# Patient Record
Sex: Female | Born: 1975 | Race: White | Hispanic: No | Marital: Married | Smoking: Never smoker
Health system: Southern US, Community
[De-identification: ages and names within clinical notes are randomized; demographics above are authoritative.]

## PROBLEM LIST (undated history)

## (undated) DIAGNOSIS — Q8740 Marfan's syndrome, unspecified: Secondary | ICD-10-CM

## (undated) HISTORY — PX: HERNIA REPAIR: SHX51

---

## 2010-09-18 ENCOUNTER — Ambulatory Visit (INDEPENDENT_AMBULATORY_CARE_PROVIDER_SITE_OTHER): Payer: 59

## 2010-09-18 ENCOUNTER — Inpatient Hospital Stay (INDEPENDENT_AMBULATORY_CARE_PROVIDER_SITE_OTHER)
Admission: RE | Admit: 2010-09-18 | Discharge: 2010-09-18 | Disposition: A | Discharge status: Home / Self Care | Payer: 59 | Source: Ambulatory Visit | Attending: Emergency Medicine | Admitting: Emergency Medicine

## 2010-09-18 DIAGNOSIS — R109 Unspecified abdominal pain: Secondary | ICD-10-CM

## 2010-09-18 LAB — POCT URINALYSIS DIPSTICK
Nitrite: NEGATIVE
Protein, ur: NEGATIVE mg/dL
Urobilinogen, UA: 0.2 mg/dL (ref 0.0–1.0)

## 2010-09-18 LAB — POCT PREGNANCY, URINE: Preg Test, Ur: NEGATIVE

## 2010-09-19 LAB — URINE CULTURE: Colony Count: NO GROWTH

## 2011-02-23 ENCOUNTER — Emergency Department (HOSPITAL_COMMUNITY): Payer: 59

## 2011-02-23 ENCOUNTER — Emergency Department (HOSPITAL_COMMUNITY)
Admission: EM | Admit: 2011-02-23 | Discharge: 2011-02-23 | Disposition: A | Discharge status: Home / Self Care | Payer: 59 | Attending: Emergency Medicine | Admitting: Emergency Medicine

## 2011-02-23 DIAGNOSIS — N83209 Unspecified ovarian cyst, unspecified side: Secondary | ICD-10-CM | POA: Insufficient documentation

## 2011-02-23 DIAGNOSIS — R011 Cardiac murmur, unspecified: Secondary | ICD-10-CM | POA: Insufficient documentation

## 2011-02-23 DIAGNOSIS — I059 Rheumatic mitral valve disease, unspecified: Secondary | ICD-10-CM | POA: Insufficient documentation

## 2011-02-23 DIAGNOSIS — R1032 Left lower quadrant pain: Secondary | ICD-10-CM | POA: Insufficient documentation

## 2011-02-23 DIAGNOSIS — R109 Unspecified abdominal pain: Secondary | ICD-10-CM | POA: Insufficient documentation

## 2011-02-23 LAB — URINALYSIS, ROUTINE W REFLEX MICROSCOPIC
Ketones, ur: NEGATIVE mg/dL
Leukocytes, UA: NEGATIVE
Nitrite: NEGATIVE
pH: 5.5 (ref 5.0–8.0)

## 2011-02-23 LAB — POCT PREGNANCY, URINE: Preg Test, Ur: NEGATIVE

## 2011-02-23 LAB — WET PREP, GENITAL: Clue Cells Wet Prep HPF POC: NONE SEEN

## 2015-12-18 DIAGNOSIS — Q8740 Marfan's syndrome, unspecified: Secondary | ICD-10-CM | POA: Diagnosis not present

## 2015-12-18 DIAGNOSIS — I359 Nonrheumatic aortic valve disorder, unspecified: Secondary | ICD-10-CM | POA: Diagnosis not present

## 2015-12-18 DIAGNOSIS — R002 Palpitations: Secondary | ICD-10-CM | POA: Diagnosis not present

## 2016-02-24 DIAGNOSIS — Q8740 Marfan's syndrome, unspecified: Secondary | ICD-10-CM | POA: Diagnosis not present

## 2016-06-30 DIAGNOSIS — H5203 Hypermetropia, bilateral: Secondary | ICD-10-CM | POA: Diagnosis not present

## 2016-06-30 DIAGNOSIS — H52223 Regular astigmatism, bilateral: Secondary | ICD-10-CM | POA: Diagnosis not present

## 2016-12-01 DIAGNOSIS — Q8740 Marfan's syndrome, unspecified: Secondary | ICD-10-CM | POA: Diagnosis not present

## 2016-12-01 DIAGNOSIS — I77810 Thoracic aortic ectasia: Secondary | ICD-10-CM | POA: Diagnosis not present

## 2016-12-01 DIAGNOSIS — I341 Nonrheumatic mitral (valve) prolapse: Secondary | ICD-10-CM | POA: Diagnosis not present

## 2016-12-02 DIAGNOSIS — I77810 Thoracic aortic ectasia: Secondary | ICD-10-CM | POA: Diagnosis not present

## 2016-12-02 DIAGNOSIS — Z8249 Family history of ischemic heart disease and other diseases of the circulatory system: Secondary | ICD-10-CM | POA: Diagnosis not present

## 2016-12-02 DIAGNOSIS — I341 Nonrheumatic mitral (valve) prolapse: Secondary | ICD-10-CM | POA: Diagnosis not present

## 2016-12-02 DIAGNOSIS — Q8740 Marfan's syndrome, unspecified: Secondary | ICD-10-CM | POA: Diagnosis not present

## 2016-12-02 DIAGNOSIS — R001 Bradycardia, unspecified: Secondary | ICD-10-CM | POA: Diagnosis not present

## 2016-12-09 DIAGNOSIS — R001 Bradycardia, unspecified: Secondary | ICD-10-CM | POA: Diagnosis not present

## 2016-12-15 DIAGNOSIS — R001 Bradycardia, unspecified: Secondary | ICD-10-CM | POA: Diagnosis not present

## 2016-12-15 DIAGNOSIS — I77810 Thoracic aortic ectasia: Secondary | ICD-10-CM | POA: Diagnosis not present

## 2016-12-15 DIAGNOSIS — I341 Nonrheumatic mitral (valve) prolapse: Secondary | ICD-10-CM | POA: Diagnosis not present

## 2016-12-15 DIAGNOSIS — Q8740 Marfan's syndrome, unspecified: Secondary | ICD-10-CM | POA: Diagnosis not present

## 2016-12-21 DIAGNOSIS — I340 Nonrheumatic mitral (valve) insufficiency: Secondary | ICD-10-CM | POA: Diagnosis not present

## 2016-12-21 DIAGNOSIS — Q8740 Marfan's syndrome, unspecified: Secondary | ICD-10-CM | POA: Diagnosis not present

## 2017-01-01 DIAGNOSIS — I77810 Thoracic aortic ectasia: Secondary | ICD-10-CM | POA: Diagnosis not present

## 2017-01-01 DIAGNOSIS — Q8740 Marfan's syndrome, unspecified: Secondary | ICD-10-CM | POA: Diagnosis not present

## 2017-01-01 DIAGNOSIS — I341 Nonrheumatic mitral (valve) prolapse: Secondary | ICD-10-CM | POA: Diagnosis not present

## 2017-01-20 DIAGNOSIS — Z7183 Encounter for nonprocreative genetic counseling: Secondary | ICD-10-CM | POA: Diagnosis not present

## 2017-02-05 DIAGNOSIS — Z0000 Encounter for general adult medical examination without abnormal findings: Secondary | ICD-10-CM | POA: Diagnosis not present

## 2017-02-09 DIAGNOSIS — Z0000 Encounter for general adult medical examination without abnormal findings: Secondary | ICD-10-CM | POA: Diagnosis not present

## 2017-02-09 DIAGNOSIS — Z23 Encounter for immunization: Secondary | ICD-10-CM | POA: Diagnosis not present

## 2017-02-09 DIAGNOSIS — I341 Nonrheumatic mitral (valve) prolapse: Secondary | ICD-10-CM | POA: Diagnosis not present

## 2017-02-09 DIAGNOSIS — I77810 Thoracic aortic ectasia: Secondary | ICD-10-CM | POA: Diagnosis not present

## 2017-02-09 DIAGNOSIS — Q8740 Marfan's syndrome, unspecified: Secondary | ICD-10-CM | POA: Diagnosis not present

## 2017-02-16 MED FILL — LOSARTAN POTASSIUM 50 MG TA: 50 | 30 days supply | Qty: 60 | Fill #0

## 2017-02-16 MED FILL — ATENOLOL 25 MG TABLET: 25 | 90 days supply | Qty: 45 | Fill #0

## 2017-03-14 DIAGNOSIS — L0291 Cutaneous abscess, unspecified: Secondary | ICD-10-CM | POA: Diagnosis not present

## 2017-03-31 MED FILL — LOSARTAN POTASSIUM 50 MG TA: 50 | 30 days supply | Qty: 60 | Fill #1

## 2017-04-06 MED FILL — ATENOLOL 25 MG TABLET: 25 | 30 days supply | Qty: 30 | Fill #0

## 2017-05-06 MED FILL — LOSARTAN POTASSIUM 50 MG TA: 50 | 30 days supply | Qty: 60 | Fill #2

## 2017-05-06 MED FILL — ATENOLOL 25 MG TABLET: 25 | 30 days supply | Qty: 30 | Fill #1

## 2017-05-19 DIAGNOSIS — Q8740 Marfan's syndrome, unspecified: Secondary | ICD-10-CM | POA: Diagnosis not present

## 2017-05-19 DIAGNOSIS — I77810 Thoracic aortic ectasia: Secondary | ICD-10-CM | POA: Diagnosis not present

## 2017-05-19 DIAGNOSIS — I341 Nonrheumatic mitral (valve) prolapse: Secondary | ICD-10-CM | POA: Diagnosis not present

## 2017-05-19 DIAGNOSIS — I517 Cardiomegaly: Secondary | ICD-10-CM | POA: Diagnosis not present

## 2017-06-07 MED FILL — ATENOLOL 25 MG TABLET: 25 | 30 days supply | Qty: 30 | Fill #2

## 2017-06-07 MED FILL — LOSARTAN POTASSIUM 50 MG TA: 50 | 30 days supply | Qty: 60 | Fill #3

## 2017-07-07 MED FILL — ATENOLOL 25 MG TABLET: 25 | 30 days supply | Qty: 30 | Fill #3

## 2017-07-07 MED FILL — LOSARTAN POTASSIUM 50 MG TA: 50 | 30 days supply | Qty: 60 | Fill #4

## 2017-08-06 MED FILL — ATENOLOL 25 MG TABLET: 25 | 30 days supply | Qty: 30 | Fill #4

## 2017-08-06 MED FILL — LOSARTAN POTASSIUM 50 MG TA: 50 | 30 days supply | Qty: 60 | Fill #5

## 2017-09-07 MED FILL — LOSARTAN POTASSIUM 50 MG TA: 50 | 30 days supply | Qty: 60 | Fill #6

## 2017-09-07 MED FILL — ATENOLOL 25 MG TABLET: 25 | 30 days supply | Qty: 30 | Fill #5

## 2017-10-07 MED FILL — LOSARTAN POTASSIUM 50 MG TA: 50 | 30 days supply | Qty: 60 | Fill #7

## 2017-10-07 MED FILL — ATENOLOL 25 MG TABLET: 25 | 30 days supply | Qty: 30 | Fill #6

## 2017-11-04 MED FILL — ATENOLOL 25 MG TABLET: 25 | 30 days supply | Qty: 30 | Fill #7

## 2017-11-08 MED FILL — LOSARTAN POTASSIUM 50 MG TA: 50 | 30 days supply | Qty: 60 | Fill #8

## 2017-12-07 MED FILL — LOSARTAN POTASSIUM 50 MG TA: 50 | 30 days supply | Qty: 60 | Fill #0

## 2017-12-07 MED FILL — ATENOLOL 25 MG TABLET: 25 | 30 days supply | Qty: 30 | Fill #8

## 2018-01-06 MED FILL — ATENOLOL 25 MG TABLET: 25 | 30 days supply | Qty: 30 | Fill #9

## 2018-01-06 MED FILL — LOSARTAN POTASSIUM 50 MG TA: 50 | 30 days supply | Qty: 60 | Fill #1

## 2018-01-14 DIAGNOSIS — Z1231 Encounter for screening mammogram for malignant neoplasm of breast: Secondary | ICD-10-CM | POA: Diagnosis not present

## 2018-02-07 MED FILL — LOSARTAN POTASSIUM 50 MG TA: 50 | 30 days supply | Qty: 60 | Fill #2

## 2018-02-07 MED FILL — ATENOLOL 25 MG TABLET: 25 | 30 days supply | Qty: 30 | Fill #10

## 2018-02-25 DIAGNOSIS — H52223 Regular astigmatism, bilateral: Secondary | ICD-10-CM | POA: Diagnosis not present

## 2018-02-25 DIAGNOSIS — H5203 Hypermetropia, bilateral: Secondary | ICD-10-CM | POA: Diagnosis not present

## 2018-03-09 MED FILL — ATENOLOL 25 MG TABLET: 25 | 30 days supply | Qty: 30 | Fill #11

## 2018-03-09 MED FILL — LOSARTAN POTASSIUM 50 MG TA: 50 | 30 days supply | Qty: 60 | Fill #3

## 2018-04-07 MED FILL — LOSARTAN POTASSIUM 50 MG TA: 50 | 30 days supply | Qty: 60 | Fill #4

## 2018-04-07 MED FILL — ATENOLOL 25 MG TABLET: 25 | 90 days supply | Qty: 90 | Fill #0

## 2018-04-20 DIAGNOSIS — R001 Bradycardia, unspecified: Secondary | ICD-10-CM | POA: Diagnosis not present

## 2018-04-20 DIAGNOSIS — I341 Nonrheumatic mitral (valve) prolapse: Secondary | ICD-10-CM | POA: Diagnosis not present

## 2018-04-20 DIAGNOSIS — Q8740 Marfan's syndrome, unspecified: Secondary | ICD-10-CM | POA: Diagnosis not present

## 2018-04-20 DIAGNOSIS — I77810 Thoracic aortic ectasia: Secondary | ICD-10-CM | POA: Diagnosis not present

## 2018-04-20 DIAGNOSIS — Z8249 Family history of ischemic heart disease and other diseases of the circulatory system: Secondary | ICD-10-CM | POA: Diagnosis not present

## 2018-04-26 DIAGNOSIS — R001 Bradycardia, unspecified: Secondary | ICD-10-CM | POA: Diagnosis not present

## 2018-05-11 MED FILL — LOSARTAN POTASSIUM 50 MG TA: 50 | 30 days supply | Qty: 60 | Fill #5

## 2018-06-13 MED FILL — LOSARTAN POTASSIUM 50 MG TA: 50 | 30 days supply | Qty: 60 | Fill #6

## 2018-07-11 MED FILL — LOSARTAN POTASSIUM 50 MG TA: 50 | 30 days supply | Qty: 60 | Fill #7

## 2018-07-11 MED FILL — ATENOLOL 25 MG TABLET: 25 | 90 days supply | Qty: 90 | Fill #1

## 2018-08-15 MED FILL — LOSARTAN POTASSIUM 50 MG TA: 50 | 30 days supply | Qty: 60 | Fill #8

## 2018-09-30 MED FILL — LOSARTAN POTASSIUM 50 MG TA: 50 | 30 days supply | Qty: 60 | Fill #9

## 2018-09-30 MED FILL — ATENOLOL 25 MG TABLET: 25 | 90 days supply | Qty: 90 | Fill #2

## 2018-10-28 MED FILL — LOSARTAN POTASSIUM 50 MG TA: 50 | 30 days supply | Qty: 60 | Fill #0

## 2018-11-21 MED FILL — LOSARTAN POTASSIUM 50 MG TA: 50 | 30 days supply | Qty: 60 | Fill #1

## 2018-12-21 MED FILL — LOSARTAN POTASSIUM 50 MG TA: 50 | 30 days supply | Qty: 60 | Fill #2

## 2018-12-29 MED FILL — ATENOLOL 25 MG TABLET: 25 | 90 days supply | Qty: 90 | Fill #3

## 2019-01-20 MED FILL — LOSARTAN POTASSIUM 50 MG TA: 50 | 30 days supply | Qty: 60 | Fill #3

## 2019-02-08 DIAGNOSIS — Z0000 Encounter for general adult medical examination without abnormal findings: Secondary | ICD-10-CM | POA: Diagnosis not present

## 2019-02-16 DIAGNOSIS — Z8719 Personal history of other diseases of the digestive system: Secondary | ICD-10-CM | POA: Diagnosis not present

## 2019-02-16 DIAGNOSIS — Z9889 Other specified postprocedural states: Secondary | ICD-10-CM | POA: Diagnosis not present

## 2019-02-16 DIAGNOSIS — R109 Unspecified abdominal pain: Secondary | ICD-10-CM | POA: Diagnosis not present

## 2019-02-16 DIAGNOSIS — Z0000 Encounter for general adult medical examination without abnormal findings: Secondary | ICD-10-CM | POA: Diagnosis not present

## 2019-02-16 DIAGNOSIS — Q8740 Marfan's syndrome, unspecified: Secondary | ICD-10-CM | POA: Diagnosis not present

## 2019-02-16 DIAGNOSIS — I341 Nonrheumatic mitral (valve) prolapse: Secondary | ICD-10-CM | POA: Diagnosis not present

## 2019-02-16 DIAGNOSIS — I77810 Thoracic aortic ectasia: Secondary | ICD-10-CM | POA: Diagnosis not present

## 2019-02-19 MED FILL — LOSARTAN POTASSIUM 50 MG TA: 50 | 30 days supply | Qty: 60 | Fill #4

## 2019-02-20 DIAGNOSIS — Z1231 Encounter for screening mammogram for malignant neoplasm of breast: Secondary | ICD-10-CM | POA: Diagnosis not present

## 2019-03-08 DIAGNOSIS — R109 Unspecified abdominal pain: Secondary | ICD-10-CM | POA: Diagnosis not present

## 2019-03-08 DIAGNOSIS — Z8719 Personal history of other diseases of the digestive system: Secondary | ICD-10-CM | POA: Diagnosis not present

## 2019-03-08 DIAGNOSIS — Z9889 Other specified postprocedural states: Secondary | ICD-10-CM | POA: Diagnosis not present

## 2019-03-21 MED FILL — LOSARTAN POTASSIUM 50 MG TA: 50 | 30 days supply | Qty: 60 | Fill #5

## 2019-03-23 DIAGNOSIS — R109 Unspecified abdominal pain: Secondary | ICD-10-CM | POA: Diagnosis not present

## 2019-03-23 DIAGNOSIS — K449 Diaphragmatic hernia without obstruction or gangrene: Secondary | ICD-10-CM | POA: Diagnosis not present

## 2019-03-23 DIAGNOSIS — K436 Other and unspecified ventral hernia with obstruction, without gangrene: Secondary | ICD-10-CM | POA: Diagnosis not present

## 2019-03-30 MED FILL — ATENOLOL 25 MG TABLET: 25 | 90 days supply | Qty: 90 | Fill #0

## 2019-04-07 DIAGNOSIS — Z8719 Personal history of other diseases of the digestive system: Secondary | ICD-10-CM | POA: Diagnosis not present

## 2019-04-07 DIAGNOSIS — K439 Ventral hernia without obstruction or gangrene: Secondary | ICD-10-CM | POA: Diagnosis not present

## 2019-04-07 DIAGNOSIS — Z9889 Other specified postprocedural states: Secondary | ICD-10-CM | POA: Diagnosis not present

## 2019-04-07 DIAGNOSIS — R109 Unspecified abdominal pain: Secondary | ICD-10-CM | POA: Diagnosis not present

## 2019-04-20 MED FILL — LOSARTAN POTASSIUM 50 MG TA: 50 | 30 days supply | Qty: 60 | Fill #6

## 2019-05-05 DIAGNOSIS — K439 Ventral hernia without obstruction or gangrene: Secondary | ICD-10-CM | POA: Diagnosis not present

## 2019-05-05 DIAGNOSIS — Z01812 Encounter for preprocedural laboratory examination: Secondary | ICD-10-CM | POA: Diagnosis not present

## 2019-05-05 DIAGNOSIS — Z20828 Contact with and (suspected) exposure to other viral communicable diseases: Secondary | ICD-10-CM | POA: Diagnosis not present

## 2019-05-11 DIAGNOSIS — K439 Ventral hernia without obstruction or gangrene: Secondary | ICD-10-CM | POA: Diagnosis not present

## 2019-05-11 DIAGNOSIS — K436 Other and unspecified ventral hernia with obstruction, without gangrene: Secondary | ICD-10-CM | POA: Diagnosis not present

## 2019-05-20 MED FILL — LOSARTAN POTASSIUM 50 MG TA: 50 | 30 days supply | Qty: 60 | Fill #7

## 2019-06-19 MED FILL — LOSARTAN POTASSIUM 50 MG TA: 50 | 30 days supply | Qty: 60 | Fill #8

## 2019-06-22 MED FILL — ATENOLOL 25 MG TABLET: 25 | 90 days supply | Qty: 90 | Fill #1

## 2019-07-19 MED FILL — LOSARTAN POTASSIUM 50 MG TA: 50 | 30 days supply | Qty: 60 | Fill #9

## 2019-08-18 MED FILL — LOSARTAN POTASSIUM 50 MG TA: 50 | 30 days supply | Qty: 60 | Fill #0

## 2019-08-22 ENCOUNTER — Ambulatory Visit: Payer: 59 | Attending: Internal Medicine

## 2019-08-22 DIAGNOSIS — Z20822 Contact with and (suspected) exposure to covid-19: Secondary | ICD-10-CM | POA: Diagnosis not present

## 2019-08-24 LAB — NOVEL CORONAVIRUS, NAA: SARS-CoV-2, NAA: NOT DETECTED

## 2019-09-11 MED FILL — LOSARTAN POTASSIUM 50 MG TA: 50 | 30 days supply | Qty: 60 | Fill #1

## 2019-09-20 MED FILL — ATENOLOL 25 MG TABLET: 25 | 90 days supply | Qty: 90 | Fill #2

## 2019-10-11 MED FILL — LOSARTAN POTASSIUM 50 MG TA: 50 | 30 days supply | Qty: 60 | Fill #2

## 2019-10-21 ENCOUNTER — Other Ambulatory Visit (HOSPITAL_COMMUNITY): Payer: Self-pay | Admitting: Pediatrics

## 2019-10-25 DIAGNOSIS — R002 Palpitations: Secondary | ICD-10-CM | POA: Diagnosis not present

## 2019-10-25 DIAGNOSIS — I77810 Thoracic aortic ectasia: Secondary | ICD-10-CM | POA: Diagnosis not present

## 2019-10-25 DIAGNOSIS — Q8740 Marfan's syndrome, unspecified: Secondary | ICD-10-CM | POA: Diagnosis not present

## 2019-10-25 DIAGNOSIS — I517 Cardiomegaly: Secondary | ICD-10-CM | POA: Diagnosis not present

## 2019-10-25 DIAGNOSIS — I341 Nonrheumatic mitral (valve) prolapse: Secondary | ICD-10-CM | POA: Diagnosis not present

## 2019-10-31 DIAGNOSIS — R001 Bradycardia, unspecified: Secondary | ICD-10-CM | POA: Diagnosis not present

## 2019-11-10 MED FILL — LOSARTAN POTASSIUM 50 MG TA: 50 | 30 days supply | Qty: 60 | Fill #3

## 2019-11-20 DIAGNOSIS — R002 Palpitations: Secondary | ICD-10-CM | POA: Diagnosis not present

## 2019-12-05 DIAGNOSIS — R42 Dizziness and giddiness: Secondary | ICD-10-CM | POA: Diagnosis not present

## 2019-12-10 MED FILL — LOSARTAN POTASSIUM 50 MG TA: 50 | 30 days supply | Qty: 60 | Fill #4

## 2019-12-19 MED FILL — ATENOLOL 25 MG TABLET: 25 | 90 days supply | Qty: 90 | Fill #3

## 2019-12-27 DIAGNOSIS — I77810 Thoracic aortic ectasia: Secondary | ICD-10-CM | POA: Diagnosis not present

## 2019-12-27 DIAGNOSIS — Q8740 Marfan's syndrome, unspecified: Secondary | ICD-10-CM | POA: Diagnosis not present

## 2019-12-27 DIAGNOSIS — I341 Nonrheumatic mitral (valve) prolapse: Secondary | ICD-10-CM | POA: Diagnosis not present

## 2019-12-27 DIAGNOSIS — R002 Palpitations: Secondary | ICD-10-CM | POA: Diagnosis not present

## 2019-12-27 DIAGNOSIS — I517 Cardiomegaly: Secondary | ICD-10-CM | POA: Diagnosis not present

## 2020-02-08 MED FILL — LOSARTAN POTASSIUM 50 MG TA: 50 | 30 days supply | Qty: 60 | Fill #6

## 2020-03-09 MED FILL — LOSARTAN POTASSIUM 50 MG TA: 50 | 30 days supply | Qty: 60 | Fill #7

## 2020-03-18 MED FILL — ATENOLOL 25 MG TABLET: 25 | 90 days supply | Qty: 90 | Fill #0

## 2020-03-23 DIAGNOSIS — Z20828 Contact with and (suspected) exposure to other viral communicable diseases: Secondary | ICD-10-CM | POA: Diagnosis not present

## 2020-03-29 DIAGNOSIS — Z20822 Contact with and (suspected) exposure to covid-19: Secondary | ICD-10-CM | POA: Diagnosis not present

## 2020-03-29 DIAGNOSIS — R5383 Other fatigue: Secondary | ICD-10-CM | POA: Diagnosis not present

## 2020-03-29 DIAGNOSIS — M542 Cervicalgia: Secondary | ICD-10-CM | POA: Diagnosis not present

## 2020-03-29 DIAGNOSIS — R509 Fever, unspecified: Secondary | ICD-10-CM | POA: Diagnosis not present

## 2020-04-01 DIAGNOSIS — E860 Dehydration: Secondary | ICD-10-CM | POA: Diagnosis not present

## 2020-04-01 DIAGNOSIS — R5383 Other fatigue: Secondary | ICD-10-CM | POA: Diagnosis not present

## 2020-04-01 DIAGNOSIS — R509 Fever, unspecified: Secondary | ICD-10-CM | POA: Diagnosis not present

## 2020-04-01 DIAGNOSIS — R197 Diarrhea, unspecified: Secondary | ICD-10-CM | POA: Diagnosis not present

## 2020-04-02 DIAGNOSIS — R5383 Other fatigue: Secondary | ICD-10-CM | POA: Diagnosis not present

## 2020-04-02 DIAGNOSIS — E860 Dehydration: Secondary | ICD-10-CM | POA: Diagnosis not present

## 2020-04-02 DIAGNOSIS — R509 Fever, unspecified: Secondary | ICD-10-CM | POA: Diagnosis not present

## 2020-04-02 DIAGNOSIS — R197 Diarrhea, unspecified: Secondary | ICD-10-CM | POA: Diagnosis not present

## 2020-04-10 ENCOUNTER — Emergency Department
Admission: EM | Admit: 2020-04-10 | Discharge: 2020-04-10 | Disposition: A | Discharge status: Home / Self Care | Payer: 59 | Source: Home / Self Care | Attending: Family Medicine | Admitting: Family Medicine

## 2020-04-10 ENCOUNTER — Encounter: Payer: Self-pay | Admitting: *Deleted

## 2020-04-10 ENCOUNTER — Other Ambulatory Visit: Payer: Self-pay

## 2020-04-10 DIAGNOSIS — M25471 Effusion, right ankle: Secondary | ICD-10-CM | POA: Diagnosis not present

## 2020-04-10 HISTORY — DX: Marfan's syndrome, unspecified: Q87.40

## 2020-04-10 HISTORY — DX: Marfan syndrome, unspecified: Q87.40

## 2020-04-10 MED FILL — LOSARTAN POTASSIUM 50 MG TA: 50 | 30 days supply | Qty: 60 | Fill #8

## 2020-04-10 NOTE — Discharge Instructions (Addendum)
To lower your risk of developing a DVT, try the following: For 30 or more minutes every day, do an activity that: Involves moving your arms and legs. Increases your heart rate. When traveling for longer than four hours: Exercise your arms and legs every hour. Drink plenty of water. Avoid drinking alcohol. Avoid sitting or lying for a long time without moving your legs.

## 2020-04-10 NOTE — ED Triage Notes (Signed)
Pt c/o RT ankle swelling x Sunday. Denies injury. She reports 3 wks of "sickness" with fever, chills, fatigue, achy joints, and diarrhea. Negative Covid test x 2. Negative stool test and normal blood work.

## 2020-04-10 NOTE — ED Provider Notes (Signed)
[name]    CSN: [id number] Arrival date & time: 04/10/20  0817      History   Chief Complaint Chief Complaint  Patient presents with   Joint Swelling    RT    HPI Bristol L Leamer is a 44 y.o. female.   Patient complains of painless swelling in her right ankle for four days, and is concerned that she could have a DVT.  However she has no pain or swelling in her right lower/calf above her right ankle.  She recalls no recent injury, but recalls that she had a significant right ankle injury at age 88.  She has no past history of DVT. She recalls similar swelling in her right ankle when she went on a prolonged road trip in July (the swelling resolved spontaneously).  The history is provided by the patient.    Past Medical History:  Diagnosis Date   Marfan syndrome     There are no problems to display for this patient.   Past Surgical History:  Procedure Laterality Date   HERNIA REPAIR      OB History   No obstetric history on file.      Home Medications    Prior to Admission medications   Medication Sig Start Date End Date Taking? Authorizing Provider  atenolol (TENORMIN) 25 MG tablet Take 1 tablet by mouth daily. 03/18/20  Yes [provider]  doxycycline (VIBRA-TABS) 100 MG tablet Take 100 mg by mouth 2 (two) times daily. 04/09/20   [provider]  losartan (COZAAR) 50 MG tablet Take 50 mg by mouth 2 (two) times daily. 04/10/20   [provider]    Family History Family History  Problem Relation Age of Onset   Marfan syndrome Father    Hepatitis C Father     Social History Social History   Tobacco Use   Smoking status: Never Smoker   Smokeless tobacco: Never Used  Scientific laboratory technician Use: Never used  Substance Use Topics   Alcohol use: Yes    Comment: occ   Drug use: Never     Allergies   Patient has no known allergies.   Review of Systems Review of Systems  Constitutional: Negative for  activity change, chills, diaphoresis, fatigue, fever and unexpected weight change.  HENT: Negative.   Eyes: Negative.   Respiratory: Negative for cough, chest tightness, shortness of breath, wheezing and stridor.   Cardiovascular: Negative for chest pain.  Gastrointestinal: Negative.   Genitourinary: Negative.   Musculoskeletal: Positive for joint swelling.  Skin: Negative.   Neurological: Negative.      Physical Exam Triage Vital Signs ED Triage Vitals  Enc Vitals Group     BP 04/10/20 0847 111/71     Pulse Rate 04/10/20 0847 94     Resp 04/10/20 0847 16     Temp 04/10/20 0847 98.4 F (36.9 C)     Temp Source 04/10/20 0847 Oral     SpO2 04/10/20 0847 97 %     Weight 04/10/20 0843 162 lb (73.5 kg)     Height 04/10/20 0843 5\' 10"  (1.778 m)     Head Circumference --      Peak Flow --      Pain Score 04/10/20 0842 2     Pain Loc --      Pain Edu? --      Excl. in [place]? --    No data found.  Updated Vital Signs BP 111/71 (BP  Location: Right Arm)    Pulse 94    Temp 98.4 F (36.9 C) (Oral)    Resp 16    Ht 5\' 10"  (1.778 m)    Wt 73.5 kg    LMP 03/27/2020    SpO2 97%    BMI 23.24 kg/m   Visual Acuity Right Eye Distance:   Left Eye Distance:   Bilateral Distance:    Right Eye Near:   Left Eye Near:    Bilateral Near:     Physical Exam Vitals and nursing note reviewed.  Constitutional:      General: She is not in acute distress. HENT:     Head: Normocephalic.     Nose: Nose normal.     Mouth/Throat:     Pharynx: Oropharynx is clear.  Eyes:     Pupils: Pupils are equal, round, and reactive to light.  Cardiovascular:     Rate and Rhythm: Normal rate and regular rhythm.     Heart sounds: Normal heart sounds.  Pulmonary:     Breath sounds: Normal breath sounds.  Abdominal:     Palpations: Abdomen is soft.     Tenderness: There is no abdominal tenderness.  Musculoskeletal:     Cervical back: Neck supple.     Right ankle: Swelling present. No deformity or  ecchymosis. No tenderness. Normal range of motion. Normal pulse.     Right Achilles Tendon: Normal.       Feet:     Comments: Right ankle has mild swelling without tenderness, erythema, or warmth.  Right ankle has full range of motion.  There is no swelling, erythema, warmth, or tenderness to palpation of the right calf.  Homan's test is negative.  Lymphadenopathy:     Cervical: No cervical adenopathy.  Skin:    General: Skin is warm and dry.  Neurological:     General: No focal deficit present.     Mental Status: She is alert.      UC Treatments / Results  Labs (all labs ordered are listed, but only abnormal results are displayed) Labs Reviewed - No data to display  EKG   Radiology No results found.  Procedures Procedures (including critical care time)  Medications Ordered in UC Medications - No data to display  Initial Impression / Assessment and Plan / UC Course  I have reviewed the triage vital signs and the nursing notes.  Pertinent labs & imaging results that were available during my care of the patient were reviewed by me and considered in my medical decision making (see chart for details).    No evidence DVT (note that swelling in right ankle has been gradually improving during the past four days). Patient is more likely to have occasional right ankle swelling because of past ankle injury age 76. Suggest wearing support hose daytime. Followup with Family Doctor if symptoms worsen.   Final Clinical Impressions(s) / UC Diagnoses   Final diagnoses:  Swelling of right ankle joint     Discharge Instructions      To lower your risk of developing a DVT, try the following: 1. For 30 or more minutes every day, do an activity that: ? Involves moving your arms and legs. ? Increases your heart rate. 2. When traveling for longer than four hours: ? Exercise your arms and legs every hour. ? Drink plenty of water. ? Avoid drinking alcohol. 3. Avoid sitting or  lying for a long time without moving your legs.   ED Prescriptions  None        Kandra Nicolas, MD 04/13/20 1949

## 2020-04-11 DIAGNOSIS — T148XXA Other injury of unspecified body region, initial encounter: Secondary | ICD-10-CM | POA: Diagnosis not present

## 2020-04-26 DIAGNOSIS — R5383 Other fatigue: Secondary | ICD-10-CM | POA: Diagnosis not present

## 2020-04-26 DIAGNOSIS — R634 Abnormal weight loss: Secondary | ICD-10-CM | POA: Diagnosis not present

## 2020-04-26 DIAGNOSIS — R000 Tachycardia, unspecified: Secondary | ICD-10-CM | POA: Diagnosis not present

## 2020-05-01 DIAGNOSIS — Z0000 Encounter for general adult medical examination without abnormal findings: Secondary | ICD-10-CM | POA: Diagnosis not present

## 2020-05-03 DIAGNOSIS — I77810 Thoracic aortic ectasia: Secondary | ICD-10-CM | POA: Diagnosis not present

## 2020-05-03 DIAGNOSIS — I341 Nonrheumatic mitral (valve) prolapse: Secondary | ICD-10-CM | POA: Diagnosis not present

## 2020-05-03 DIAGNOSIS — Q8740 Marfan's syndrome, unspecified: Secondary | ICD-10-CM | POA: Diagnosis not present

## 2020-05-03 DIAGNOSIS — D649 Anemia, unspecified: Secondary | ICD-10-CM | POA: Diagnosis not present

## 2020-05-03 DIAGNOSIS — Z0000 Encounter for general adult medical examination without abnormal findings: Secondary | ICD-10-CM | POA: Diagnosis not present

## 2020-05-07 DIAGNOSIS — Q233 Congenital mitral insufficiency: Secondary | ICD-10-CM | POA: Diagnosis not present

## 2020-05-07 DIAGNOSIS — I4589 Other specified conduction disorders: Secondary | ICD-10-CM | POA: Diagnosis not present

## 2020-05-07 DIAGNOSIS — R42 Dizziness and giddiness: Secondary | ICD-10-CM | POA: Diagnosis not present

## 2020-05-07 DIAGNOSIS — Q8740 Marfan's syndrome, unspecified: Secondary | ICD-10-CM | POA: Diagnosis not present

## 2020-05-07 DIAGNOSIS — I472 Ventricular tachycardia: Secondary | ICD-10-CM | POA: Diagnosis not present

## 2020-05-07 DIAGNOSIS — R002 Palpitations: Secondary | ICD-10-CM | POA: Diagnosis not present

## 2020-05-07 DIAGNOSIS — I471 Supraventricular tachycardia: Secondary | ICD-10-CM | POA: Diagnosis not present

## 2020-05-08 DIAGNOSIS — I4589 Other specified conduction disorders: Secondary | ICD-10-CM | POA: Diagnosis not present

## 2020-05-08 MED FILL — LOSARTAN POTASSIUM 50 MG TA: 50 | 30 days supply | Qty: 60 | Fill #9

## 2020-05-25 DIAGNOSIS — I341 Nonrheumatic mitral (valve) prolapse: Secondary | ICD-10-CM | POA: Diagnosis not present

## 2020-05-25 DIAGNOSIS — I340 Nonrheumatic mitral (valve) insufficiency: Secondary | ICD-10-CM | POA: Diagnosis not present

## 2020-05-29 DIAGNOSIS — R053 Chronic cough: Secondary | ICD-10-CM | POA: Diagnosis not present

## 2020-05-29 DIAGNOSIS — R5383 Other fatigue: Secondary | ICD-10-CM | POA: Diagnosis not present

## 2020-05-29 DIAGNOSIS — R002 Palpitations: Secondary | ICD-10-CM | POA: Diagnosis not present

## 2020-05-29 DIAGNOSIS — M25551 Pain in right hip: Secondary | ICD-10-CM | POA: Diagnosis not present

## 2020-05-29 DIAGNOSIS — M898X1 Other specified disorders of bone, shoulder: Secondary | ICD-10-CM | POA: Diagnosis not present

## 2020-05-29 DIAGNOSIS — D649 Anemia, unspecified: Secondary | ICD-10-CM | POA: Diagnosis not present

## 2020-05-29 DIAGNOSIS — I77810 Thoracic aortic ectasia: Secondary | ICD-10-CM | POA: Diagnosis not present

## 2020-05-29 DIAGNOSIS — R600 Localized edema: Secondary | ICD-10-CM | POA: Diagnosis not present

## 2020-05-29 MED FILL — LOSARTAN POTASSIUM 100 MG T: 100 | 30 days supply | Qty: 30 | Fill #0

## 2020-05-29 MED FILL — ATENOLOL 25 MG TABLET: 25 | 90 days supply | Qty: 135 | Fill #0

## 2020-06-03 DIAGNOSIS — Z1210 Encounter for screening for malignant neoplasm of intestinal tract, unspecified: Secondary | ICD-10-CM | POA: Diagnosis not present

## 2020-06-14 DIAGNOSIS — R195 Other fecal abnormalities: Secondary | ICD-10-CM | POA: Diagnosis not present

## 2020-06-14 DIAGNOSIS — D509 Iron deficiency anemia, unspecified: Secondary | ICD-10-CM | POA: Diagnosis not present

## 2020-06-28 DIAGNOSIS — D509 Iron deficiency anemia, unspecified: Secondary | ICD-10-CM | POA: Diagnosis not present

## 2020-06-28 DIAGNOSIS — K5730 Diverticulosis of large intestine without perforation or abscess without bleeding: Secondary | ICD-10-CM | POA: Diagnosis not present

## 2020-06-28 DIAGNOSIS — D123 Benign neoplasm of transverse colon: Secondary | ICD-10-CM | POA: Diagnosis not present

## 2020-06-28 DIAGNOSIS — K648 Other hemorrhoids: Secondary | ICD-10-CM | POA: Diagnosis not present

## 2020-06-28 DIAGNOSIS — K921 Melena: Secondary | ICD-10-CM | POA: Diagnosis not present

## 2020-06-28 DIAGNOSIS — K635 Polyp of colon: Secondary | ICD-10-CM | POA: Diagnosis not present

## 2020-07-01 DIAGNOSIS — D509 Iron deficiency anemia, unspecified: Secondary | ICD-10-CM | POA: Diagnosis not present

## 2020-07-01 DIAGNOSIS — D6489 Other specified anemias: Secondary | ICD-10-CM | POA: Diagnosis not present

## 2020-07-01 DIAGNOSIS — R7989 Other specified abnormal findings of blood chemistry: Secondary | ICD-10-CM | POA: Diagnosis not present

## 2020-07-01 DIAGNOSIS — Q8740 Marfan's syndrome, unspecified: Secondary | ICD-10-CM | POA: Diagnosis not present

## 2020-07-10 DIAGNOSIS — R161 Splenomegaly, not elsewhere classified: Secondary | ICD-10-CM | POA: Diagnosis not present

## 2020-07-10 DIAGNOSIS — J9811 Atelectasis: Secondary | ICD-10-CM | POA: Diagnosis not present

## 2020-07-10 DIAGNOSIS — D509 Iron deficiency anemia, unspecified: Secondary | ICD-10-CM | POA: Diagnosis not present

## 2020-07-10 DIAGNOSIS — Q676 Pectus excavatum: Secondary | ICD-10-CM | POA: Diagnosis not present

## 2020-07-10 DIAGNOSIS — K5730 Diverticulosis of large intestine without perforation or abscess without bleeding: Secondary | ICD-10-CM | POA: Diagnosis not present

## 2020-07-22 DIAGNOSIS — M545 Low back pain, unspecified: Secondary | ICD-10-CM | POA: Diagnosis not present

## 2020-07-22 DIAGNOSIS — M0579 Rheumatoid arthritis with rheumatoid factor of multiple sites without organ or systems involvement: Secondary | ICD-10-CM | POA: Diagnosis not present

## 2020-07-22 DIAGNOSIS — M069 Rheumatoid arthritis, unspecified: Secondary | ICD-10-CM | POA: Diagnosis not present

## 2020-07-22 DIAGNOSIS — R161 Splenomegaly, not elsewhere classified: Secondary | ICD-10-CM | POA: Diagnosis not present

## 2020-07-22 DIAGNOSIS — D509 Iron deficiency anemia, unspecified: Secondary | ICD-10-CM | POA: Diagnosis not present

## 2020-07-24 DIAGNOSIS — Q8740 Marfan's syndrome, unspecified: Secondary | ICD-10-CM | POA: Diagnosis not present

## 2020-07-24 DIAGNOSIS — M249 Joint derangement, unspecified: Secondary | ICD-10-CM | POA: Diagnosis not present

## 2020-07-24 DIAGNOSIS — R768 Other specified abnormal immunological findings in serum: Secondary | ICD-10-CM | POA: Diagnosis not present

## 2020-07-24 DIAGNOSIS — M0579 Rheumatoid arthritis with rheumatoid factor of multiple sites without organ or systems involvement: Secondary | ICD-10-CM | POA: Diagnosis not present

## 2020-08-05 DIAGNOSIS — S335XXA Sprain of ligaments of lumbar spine, initial encounter: Secondary | ICD-10-CM | POA: Diagnosis not present

## 2020-08-05 DIAGNOSIS — M9903 Segmental and somatic dysfunction of lumbar region: Secondary | ICD-10-CM | POA: Diagnosis not present

## 2020-08-05 DIAGNOSIS — M9904 Segmental and somatic dysfunction of sacral region: Secondary | ICD-10-CM | POA: Diagnosis not present

## 2020-08-05 DIAGNOSIS — M62830 Muscle spasm of back: Secondary | ICD-10-CM | POA: Diagnosis not present

## 2020-08-05 DIAGNOSIS — M9905 Segmental and somatic dysfunction of pelvic region: Secondary | ICD-10-CM | POA: Diagnosis not present

## 2020-08-06 DIAGNOSIS — M62830 Muscle spasm of back: Secondary | ICD-10-CM | POA: Diagnosis not present

## 2020-08-06 DIAGNOSIS — M9905 Segmental and somatic dysfunction of pelvic region: Secondary | ICD-10-CM | POA: Diagnosis not present

## 2020-08-06 DIAGNOSIS — M9903 Segmental and somatic dysfunction of lumbar region: Secondary | ICD-10-CM | POA: Diagnosis not present

## 2020-08-06 DIAGNOSIS — S335XXA Sprain of ligaments of lumbar spine, initial encounter: Secondary | ICD-10-CM | POA: Diagnosis not present

## 2020-08-06 DIAGNOSIS — M9904 Segmental and somatic dysfunction of sacral region: Secondary | ICD-10-CM | POA: Diagnosis not present

## 2020-08-08 DIAGNOSIS — M9904 Segmental and somatic dysfunction of sacral region: Secondary | ICD-10-CM | POA: Diagnosis not present

## 2020-08-08 DIAGNOSIS — S335XXA Sprain of ligaments of lumbar spine, initial encounter: Secondary | ICD-10-CM | POA: Diagnosis not present

## 2020-08-08 DIAGNOSIS — M62830 Muscle spasm of back: Secondary | ICD-10-CM | POA: Diagnosis not present

## 2020-08-08 DIAGNOSIS — M9903 Segmental and somatic dysfunction of lumbar region: Secondary | ICD-10-CM | POA: Diagnosis not present

## 2020-08-08 DIAGNOSIS — M9905 Segmental and somatic dysfunction of pelvic region: Secondary | ICD-10-CM | POA: Diagnosis not present

## 2020-08-09 MED FILL — LOSARTAN POTASSIUM 100 MG T: 100 | 30 days supply | Qty: 30 | Fill #1

## 2020-08-12 DIAGNOSIS — M62830 Muscle spasm of back: Secondary | ICD-10-CM | POA: Diagnosis not present

## 2020-08-12 DIAGNOSIS — M9903 Segmental and somatic dysfunction of lumbar region: Secondary | ICD-10-CM | POA: Diagnosis not present

## 2020-08-12 DIAGNOSIS — M9905 Segmental and somatic dysfunction of pelvic region: Secondary | ICD-10-CM | POA: Diagnosis not present

## 2020-08-12 DIAGNOSIS — M9904 Segmental and somatic dysfunction of sacral region: Secondary | ICD-10-CM | POA: Diagnosis not present

## 2020-08-12 DIAGNOSIS — S335XXA Sprain of ligaments of lumbar spine, initial encounter: Secondary | ICD-10-CM | POA: Diagnosis not present

## 2020-08-14 DIAGNOSIS — S335XXA Sprain of ligaments of lumbar spine, initial encounter: Secondary | ICD-10-CM | POA: Diagnosis not present

## 2020-08-14 DIAGNOSIS — M9904 Segmental and somatic dysfunction of sacral region: Secondary | ICD-10-CM | POA: Diagnosis not present

## 2020-08-14 DIAGNOSIS — M62830 Muscle spasm of back: Secondary | ICD-10-CM | POA: Diagnosis not present

## 2020-08-14 DIAGNOSIS — M9905 Segmental and somatic dysfunction of pelvic region: Secondary | ICD-10-CM | POA: Diagnosis not present

## 2020-08-14 DIAGNOSIS — M9903 Segmental and somatic dysfunction of lumbar region: Secondary | ICD-10-CM | POA: Diagnosis not present

## 2020-08-16 DIAGNOSIS — M9904 Segmental and somatic dysfunction of sacral region: Secondary | ICD-10-CM | POA: Diagnosis not present

## 2020-08-16 DIAGNOSIS — S335XXA Sprain of ligaments of lumbar spine, initial encounter: Secondary | ICD-10-CM | POA: Diagnosis not present

## 2020-08-16 DIAGNOSIS — M9903 Segmental and somatic dysfunction of lumbar region: Secondary | ICD-10-CM | POA: Diagnosis not present

## 2020-08-16 DIAGNOSIS — E559 Vitamin D deficiency, unspecified: Secondary | ICD-10-CM | POA: Diagnosis not present

## 2020-08-16 DIAGNOSIS — D649 Anemia, unspecified: Secondary | ICD-10-CM | POA: Diagnosis not present

## 2020-08-16 DIAGNOSIS — R5383 Other fatigue: Secondary | ICD-10-CM | POA: Diagnosis not present

## 2020-08-16 DIAGNOSIS — M62830 Muscle spasm of back: Secondary | ICD-10-CM | POA: Diagnosis not present

## 2020-08-16 DIAGNOSIS — R4589 Other symptoms and signs involving emotional state: Secondary | ICD-10-CM | POA: Diagnosis not present

## 2020-08-16 DIAGNOSIS — M9905 Segmental and somatic dysfunction of pelvic region: Secondary | ICD-10-CM | POA: Diagnosis not present

## 2020-08-21 ENCOUNTER — Other Ambulatory Visit (HOSPITAL_COMMUNITY): Payer: Self-pay | Admitting: Internal Medicine

## 2020-08-21 DIAGNOSIS — M62830 Muscle spasm of back: Secondary | ICD-10-CM | POA: Diagnosis not present

## 2020-08-21 DIAGNOSIS — M9905 Segmental and somatic dysfunction of pelvic region: Secondary | ICD-10-CM | POA: Diagnosis not present

## 2020-08-21 DIAGNOSIS — M9904 Segmental and somatic dysfunction of sacral region: Secondary | ICD-10-CM | POA: Diagnosis not present

## 2020-08-21 DIAGNOSIS — M9903 Segmental and somatic dysfunction of lumbar region: Secondary | ICD-10-CM | POA: Diagnosis not present

## 2020-08-21 DIAGNOSIS — S335XXA Sprain of ligaments of lumbar spine, initial encounter: Secondary | ICD-10-CM | POA: Diagnosis not present

## 2020-08-21 MED FILL — ATENOLOL 25 MG TABLET: 25 | 90 days supply | Qty: 135 | Fill #0

## 2020-08-22 DIAGNOSIS — M9905 Segmental and somatic dysfunction of pelvic region: Secondary | ICD-10-CM | POA: Diagnosis not present

## 2020-08-22 DIAGNOSIS — M9903 Segmental and somatic dysfunction of lumbar region: Secondary | ICD-10-CM | POA: Diagnosis not present

## 2020-08-22 DIAGNOSIS — S335XXA Sprain of ligaments of lumbar spine, initial encounter: Secondary | ICD-10-CM | POA: Diagnosis not present

## 2020-08-22 DIAGNOSIS — M62830 Muscle spasm of back: Secondary | ICD-10-CM | POA: Diagnosis not present

## 2020-08-22 DIAGNOSIS — M9904 Segmental and somatic dysfunction of sacral region: Secondary | ICD-10-CM | POA: Diagnosis not present

## 2020-08-26 DIAGNOSIS — M62830 Muscle spasm of back: Secondary | ICD-10-CM | POA: Diagnosis not present

## 2020-08-26 DIAGNOSIS — M9905 Segmental and somatic dysfunction of pelvic region: Secondary | ICD-10-CM | POA: Diagnosis not present

## 2020-08-26 DIAGNOSIS — M9903 Segmental and somatic dysfunction of lumbar region: Secondary | ICD-10-CM | POA: Diagnosis not present

## 2020-08-26 DIAGNOSIS — M9904 Segmental and somatic dysfunction of sacral region: Secondary | ICD-10-CM | POA: Diagnosis not present

## 2020-08-26 DIAGNOSIS — S335XXA Sprain of ligaments of lumbar spine, initial encounter: Secondary | ICD-10-CM | POA: Diagnosis not present

## 2020-08-27 DIAGNOSIS — D509 Iron deficiency anemia, unspecified: Secondary | ICD-10-CM | POA: Diagnosis not present

## 2020-08-30 DIAGNOSIS — M9904 Segmental and somatic dysfunction of sacral region: Secondary | ICD-10-CM | POA: Diagnosis not present

## 2020-08-30 DIAGNOSIS — M62830 Muscle spasm of back: Secondary | ICD-10-CM | POA: Diagnosis not present

## 2020-08-30 DIAGNOSIS — S335XXA Sprain of ligaments of lumbar spine, initial encounter: Secondary | ICD-10-CM | POA: Diagnosis not present

## 2020-08-30 DIAGNOSIS — M9905 Segmental and somatic dysfunction of pelvic region: Secondary | ICD-10-CM | POA: Diagnosis not present

## 2020-08-30 DIAGNOSIS — M9903 Segmental and somatic dysfunction of lumbar region: Secondary | ICD-10-CM | POA: Diagnosis not present

## 2020-09-04 DIAGNOSIS — M6281 Muscle weakness (generalized): Secondary | ICD-10-CM | POA: Diagnosis not present

## 2020-09-04 DIAGNOSIS — M9905 Segmental and somatic dysfunction of pelvic region: Secondary | ICD-10-CM | POA: Diagnosis not present

## 2020-09-04 DIAGNOSIS — M2569 Stiffness of other specified joint, not elsewhere classified: Secondary | ICD-10-CM | POA: Diagnosis not present

## 2020-09-04 DIAGNOSIS — S335XXA Sprain of ligaments of lumbar spine, initial encounter: Secondary | ICD-10-CM | POA: Diagnosis not present

## 2020-09-04 DIAGNOSIS — M545 Low back pain, unspecified: Secondary | ICD-10-CM | POA: Diagnosis not present

## 2020-09-04 DIAGNOSIS — R293 Abnormal posture: Secondary | ICD-10-CM | POA: Diagnosis not present

## 2020-09-04 DIAGNOSIS — M62830 Muscle spasm of back: Secondary | ICD-10-CM | POA: Diagnosis not present

## 2020-09-04 DIAGNOSIS — Z7409 Other reduced mobility: Secondary | ICD-10-CM | POA: Diagnosis not present

## 2020-09-04 DIAGNOSIS — M9904 Segmental and somatic dysfunction of sacral region: Secondary | ICD-10-CM | POA: Diagnosis not present

## 2020-09-04 DIAGNOSIS — Q8740 Marfan's syndrome, unspecified: Secondary | ICD-10-CM | POA: Diagnosis not present

## 2020-09-04 DIAGNOSIS — M9903 Segmental and somatic dysfunction of lumbar region: Secondary | ICD-10-CM | POA: Diagnosis not present

## 2020-09-04 DIAGNOSIS — M249 Joint derangement, unspecified: Secondary | ICD-10-CM | POA: Diagnosis not present

## 2020-09-06 DIAGNOSIS — M9905 Segmental and somatic dysfunction of pelvic region: Secondary | ICD-10-CM | POA: Diagnosis not present

## 2020-09-06 DIAGNOSIS — M9904 Segmental and somatic dysfunction of sacral region: Secondary | ICD-10-CM | POA: Diagnosis not present

## 2020-09-06 DIAGNOSIS — S335XXA Sprain of ligaments of lumbar spine, initial encounter: Secondary | ICD-10-CM | POA: Diagnosis not present

## 2020-09-06 DIAGNOSIS — M9903 Segmental and somatic dysfunction of lumbar region: Secondary | ICD-10-CM | POA: Diagnosis not present

## 2020-09-06 DIAGNOSIS — M62830 Muscle spasm of back: Secondary | ICD-10-CM | POA: Diagnosis not present

## 2020-09-10 DIAGNOSIS — M9903 Segmental and somatic dysfunction of lumbar region: Secondary | ICD-10-CM | POA: Diagnosis not present

## 2020-09-10 DIAGNOSIS — M62830 Muscle spasm of back: Secondary | ICD-10-CM | POA: Diagnosis not present

## 2020-09-10 DIAGNOSIS — M9905 Segmental and somatic dysfunction of pelvic region: Secondary | ICD-10-CM | POA: Diagnosis not present

## 2020-09-10 DIAGNOSIS — S335XXA Sprain of ligaments of lumbar spine, initial encounter: Secondary | ICD-10-CM | POA: Diagnosis not present

## 2020-09-10 DIAGNOSIS — M9904 Segmental and somatic dysfunction of sacral region: Secondary | ICD-10-CM | POA: Diagnosis not present

## 2020-09-11 DIAGNOSIS — R293 Abnormal posture: Secondary | ICD-10-CM | POA: Diagnosis not present

## 2020-09-11 DIAGNOSIS — M249 Joint derangement, unspecified: Secondary | ICD-10-CM | POA: Diagnosis not present

## 2020-09-11 DIAGNOSIS — M6281 Muscle weakness (generalized): Secondary | ICD-10-CM | POA: Diagnosis not present

## 2020-09-11 DIAGNOSIS — Q8740 Marfan's syndrome, unspecified: Secondary | ICD-10-CM | POA: Diagnosis not present

## 2020-09-11 DIAGNOSIS — Z7409 Other reduced mobility: Secondary | ICD-10-CM | POA: Diagnosis not present

## 2020-09-11 DIAGNOSIS — M2569 Stiffness of other specified joint, not elsewhere classified: Secondary | ICD-10-CM | POA: Diagnosis not present

## 2020-09-11 DIAGNOSIS — D509 Iron deficiency anemia, unspecified: Secondary | ICD-10-CM | POA: Diagnosis not present

## 2020-09-11 DIAGNOSIS — M545 Low back pain, unspecified: Secondary | ICD-10-CM | POA: Diagnosis not present

## 2020-09-11 DIAGNOSIS — T189XXA Foreign body of alimentary tract, part unspecified, initial encounter: Secondary | ICD-10-CM | POA: Diagnosis not present

## 2020-09-12 DIAGNOSIS — M62830 Muscle spasm of back: Secondary | ICD-10-CM | POA: Diagnosis not present

## 2020-09-12 DIAGNOSIS — M9905 Segmental and somatic dysfunction of pelvic region: Secondary | ICD-10-CM | POA: Diagnosis not present

## 2020-09-12 DIAGNOSIS — M9903 Segmental and somatic dysfunction of lumbar region: Secondary | ICD-10-CM | POA: Diagnosis not present

## 2020-09-12 DIAGNOSIS — S335XXA Sprain of ligaments of lumbar spine, initial encounter: Secondary | ICD-10-CM | POA: Diagnosis not present

## 2020-09-12 DIAGNOSIS — M9904 Segmental and somatic dysfunction of sacral region: Secondary | ICD-10-CM | POA: Diagnosis not present

## 2020-09-13 MED FILL — LOSARTAN POTASSIUM 50 MG TA: 50 | 30 days supply | Qty: 60 | Fill #0

## 2020-09-16 DIAGNOSIS — M545 Low back pain, unspecified: Secondary | ICD-10-CM | POA: Diagnosis not present

## 2020-09-16 DIAGNOSIS — R1909 Other intra-abdominal and pelvic swelling, mass and lump: Secondary | ICD-10-CM | POA: Diagnosis not present

## 2020-09-16 DIAGNOSIS — D649 Anemia, unspecified: Secondary | ICD-10-CM | POA: Diagnosis not present

## 2020-09-16 DIAGNOSIS — M533 Sacrococcygeal disorders, not elsewhere classified: Secondary | ICD-10-CM | POA: Diagnosis not present

## 2020-09-16 DIAGNOSIS — R5383 Other fatigue: Secondary | ICD-10-CM | POA: Diagnosis not present

## 2020-09-17 DIAGNOSIS — M9903 Segmental and somatic dysfunction of lumbar region: Secondary | ICD-10-CM | POA: Diagnosis not present

## 2020-09-17 DIAGNOSIS — S335XXA Sprain of ligaments of lumbar spine, initial encounter: Secondary | ICD-10-CM | POA: Diagnosis not present

## 2020-09-17 DIAGNOSIS — M9904 Segmental and somatic dysfunction of sacral region: Secondary | ICD-10-CM | POA: Diagnosis not present

## 2020-09-17 DIAGNOSIS — M62830 Muscle spasm of back: Secondary | ICD-10-CM | POA: Diagnosis not present

## 2020-09-17 DIAGNOSIS — M9905 Segmental and somatic dysfunction of pelvic region: Secondary | ICD-10-CM | POA: Diagnosis not present

## 2020-09-18 DIAGNOSIS — M545 Low back pain, unspecified: Secondary | ICD-10-CM | POA: Diagnosis not present

## 2020-09-18 DIAGNOSIS — Z7409 Other reduced mobility: Secondary | ICD-10-CM | POA: Diagnosis not present

## 2020-09-18 DIAGNOSIS — M2569 Stiffness of other specified joint, not elsewhere classified: Secondary | ICD-10-CM | POA: Diagnosis not present

## 2020-09-18 DIAGNOSIS — M6281 Muscle weakness (generalized): Secondary | ICD-10-CM | POA: Diagnosis not present

## 2020-09-18 DIAGNOSIS — R293 Abnormal posture: Secondary | ICD-10-CM | POA: Diagnosis not present

## 2020-09-18 DIAGNOSIS — Q8740 Marfan's syndrome, unspecified: Secondary | ICD-10-CM | POA: Diagnosis not present

## 2020-09-18 DIAGNOSIS — M249 Joint derangement, unspecified: Secondary | ICD-10-CM | POA: Diagnosis not present

## 2020-09-19 DIAGNOSIS — M9904 Segmental and somatic dysfunction of sacral region: Secondary | ICD-10-CM | POA: Diagnosis not present

## 2020-09-19 DIAGNOSIS — M9903 Segmental and somatic dysfunction of lumbar region: Secondary | ICD-10-CM | POA: Diagnosis not present

## 2020-09-19 DIAGNOSIS — M9902 Segmental and somatic dysfunction of thoracic region: Secondary | ICD-10-CM | POA: Diagnosis not present

## 2020-09-19 DIAGNOSIS — S335XXA Sprain of ligaments of lumbar spine, initial encounter: Secondary | ICD-10-CM | POA: Diagnosis not present

## 2020-09-19 DIAGNOSIS — M62830 Muscle spasm of back: Secondary | ICD-10-CM | POA: Diagnosis not present

## 2020-09-19 DIAGNOSIS — M9905 Segmental and somatic dysfunction of pelvic region: Secondary | ICD-10-CM | POA: Diagnosis not present

## 2020-09-20 DIAGNOSIS — D649 Anemia, unspecified: Secondary | ICD-10-CM | POA: Diagnosis not present

## 2020-09-21 DIAGNOSIS — D649 Anemia, unspecified: Secondary | ICD-10-CM | POA: Diagnosis not present

## 2020-09-24 DIAGNOSIS — R1900 Intra-abdominal and pelvic swelling, mass and lump, unspecified site: Secondary | ICD-10-CM | POA: Diagnosis not present

## 2020-09-24 DIAGNOSIS — R1909 Other intra-abdominal and pelvic swelling, mass and lump: Secondary | ICD-10-CM | POA: Diagnosis not present

## 2020-09-25 DIAGNOSIS — Q8740 Marfan's syndrome, unspecified: Secondary | ICD-10-CM | POA: Diagnosis not present

## 2020-09-25 DIAGNOSIS — M545 Low back pain, unspecified: Secondary | ICD-10-CM | POA: Diagnosis not present

## 2020-09-25 DIAGNOSIS — M249 Joint derangement, unspecified: Secondary | ICD-10-CM | POA: Diagnosis not present

## 2020-09-25 DIAGNOSIS — M2569 Stiffness of other specified joint, not elsewhere classified: Secondary | ICD-10-CM | POA: Diagnosis not present

## 2020-09-25 DIAGNOSIS — Z7409 Other reduced mobility: Secondary | ICD-10-CM | POA: Diagnosis not present

## 2020-09-25 DIAGNOSIS — M6281 Muscle weakness (generalized): Secondary | ICD-10-CM | POA: Diagnosis not present

## 2020-09-25 DIAGNOSIS — R293 Abnormal posture: Secondary | ICD-10-CM | POA: Diagnosis not present

## 2020-09-26 DIAGNOSIS — M9902 Segmental and somatic dysfunction of thoracic region: Secondary | ICD-10-CM | POA: Diagnosis not present

## 2020-09-26 DIAGNOSIS — M9904 Segmental and somatic dysfunction of sacral region: Secondary | ICD-10-CM | POA: Diagnosis not present

## 2020-09-26 DIAGNOSIS — M9905 Segmental and somatic dysfunction of pelvic region: Secondary | ICD-10-CM | POA: Diagnosis not present

## 2020-09-26 DIAGNOSIS — M62830 Muscle spasm of back: Secondary | ICD-10-CM | POA: Diagnosis not present

## 2020-09-26 DIAGNOSIS — M9903 Segmental and somatic dysfunction of lumbar region: Secondary | ICD-10-CM | POA: Diagnosis not present

## 2020-09-26 DIAGNOSIS — S335XXA Sprain of ligaments of lumbar spine, initial encounter: Secondary | ICD-10-CM | POA: Diagnosis not present

## 2020-09-30 DIAGNOSIS — M059 Rheumatoid arthritis with rheumatoid factor, unspecified: Secondary | ICD-10-CM | POA: Diagnosis not present

## 2020-09-30 DIAGNOSIS — R161 Splenomegaly, not elsewhere classified: Secondary | ICD-10-CM | POA: Diagnosis not present

## 2020-09-30 DIAGNOSIS — D509 Iron deficiency anemia, unspecified: Secondary | ICD-10-CM | POA: Diagnosis not present

## 2020-09-30 DIAGNOSIS — R768 Other specified abnormal immunological findings in serum: Secondary | ICD-10-CM | POA: Diagnosis not present

## 2020-09-30 DIAGNOSIS — Q87418 Marfan's syndrome with other cardiovascular manifestations: Secondary | ICD-10-CM | POA: Diagnosis not present

## 2020-09-30 DIAGNOSIS — D649 Anemia, unspecified: Secondary | ICD-10-CM | POA: Diagnosis not present

## 2020-10-02 DIAGNOSIS — M249 Joint derangement, unspecified: Secondary | ICD-10-CM | POA: Diagnosis not present

## 2020-10-02 DIAGNOSIS — M2569 Stiffness of other specified joint, not elsewhere classified: Secondary | ICD-10-CM | POA: Diagnosis not present

## 2020-10-02 DIAGNOSIS — Q8740 Marfan's syndrome, unspecified: Secondary | ICD-10-CM | POA: Diagnosis not present

## 2020-10-02 DIAGNOSIS — R293 Abnormal posture: Secondary | ICD-10-CM | POA: Diagnosis not present

## 2020-10-02 DIAGNOSIS — M545 Low back pain, unspecified: Secondary | ICD-10-CM | POA: Diagnosis not present

## 2020-10-02 DIAGNOSIS — D509 Iron deficiency anemia, unspecified: Secondary | ICD-10-CM | POA: Diagnosis not present

## 2020-10-02 DIAGNOSIS — M6281 Muscle weakness (generalized): Secondary | ICD-10-CM | POA: Diagnosis not present

## 2020-10-02 DIAGNOSIS — Z7409 Other reduced mobility: Secondary | ICD-10-CM | POA: Diagnosis not present

## 2020-10-03 DIAGNOSIS — M9904 Segmental and somatic dysfunction of sacral region: Secondary | ICD-10-CM | POA: Diagnosis not present

## 2020-10-03 DIAGNOSIS — M62830 Muscle spasm of back: Secondary | ICD-10-CM | POA: Diagnosis not present

## 2020-10-03 DIAGNOSIS — M9903 Segmental and somatic dysfunction of lumbar region: Secondary | ICD-10-CM | POA: Diagnosis not present

## 2020-10-03 DIAGNOSIS — M9902 Segmental and somatic dysfunction of thoracic region: Secondary | ICD-10-CM | POA: Diagnosis not present

## 2020-10-03 DIAGNOSIS — M9905 Segmental and somatic dysfunction of pelvic region: Secondary | ICD-10-CM | POA: Diagnosis not present

## 2020-10-03 DIAGNOSIS — S335XXA Sprain of ligaments of lumbar spine, initial encounter: Secondary | ICD-10-CM | POA: Diagnosis not present

## 2020-10-04 DIAGNOSIS — D509 Iron deficiency anemia, unspecified: Secondary | ICD-10-CM | POA: Diagnosis not present

## 2020-10-07 MED FILL — LOSARTAN POTASSIUM 50 MG TA: 50 | 30 days supply | Qty: 60 | Fill #1

## 2020-10-08 DIAGNOSIS — R161 Splenomegaly, not elsewhere classified: Secondary | ICD-10-CM | POA: Diagnosis not present

## 2020-10-08 DIAGNOSIS — R748 Abnormal levels of other serum enzymes: Secondary | ICD-10-CM | POA: Diagnosis not present

## 2020-10-08 DIAGNOSIS — R160 Hepatomegaly, not elsewhere classified: Secondary | ICD-10-CM | POA: Diagnosis not present

## 2020-10-08 DIAGNOSIS — D509 Iron deficiency anemia, unspecified: Secondary | ICD-10-CM | POA: Diagnosis not present

## 2020-10-09 DIAGNOSIS — M249 Joint derangement, unspecified: Secondary | ICD-10-CM | POA: Diagnosis not present

## 2020-10-09 DIAGNOSIS — Z7409 Other reduced mobility: Secondary | ICD-10-CM | POA: Diagnosis not present

## 2020-10-09 DIAGNOSIS — Q8740 Marfan's syndrome, unspecified: Secondary | ICD-10-CM | POA: Diagnosis not present

## 2020-10-09 DIAGNOSIS — M6281 Muscle weakness (generalized): Secondary | ICD-10-CM | POA: Diagnosis not present

## 2020-10-09 DIAGNOSIS — R293 Abnormal posture: Secondary | ICD-10-CM | POA: Diagnosis not present

## 2020-10-09 DIAGNOSIS — M545 Low back pain, unspecified: Secondary | ICD-10-CM | POA: Diagnosis not present

## 2020-10-09 DIAGNOSIS — M2569 Stiffness of other specified joint, not elsewhere classified: Secondary | ICD-10-CM | POA: Diagnosis not present

## 2020-10-10 DIAGNOSIS — M9903 Segmental and somatic dysfunction of lumbar region: Secondary | ICD-10-CM | POA: Diagnosis not present

## 2020-10-10 DIAGNOSIS — M9905 Segmental and somatic dysfunction of pelvic region: Secondary | ICD-10-CM | POA: Diagnosis not present

## 2020-10-10 DIAGNOSIS — M9904 Segmental and somatic dysfunction of sacral region: Secondary | ICD-10-CM | POA: Diagnosis not present

## 2020-10-10 DIAGNOSIS — M62830 Muscle spasm of back: Secondary | ICD-10-CM | POA: Diagnosis not present

## 2020-10-10 DIAGNOSIS — S335XXA Sprain of ligaments of lumbar spine, initial encounter: Secondary | ICD-10-CM | POA: Diagnosis not present

## 2020-10-10 DIAGNOSIS — M9902 Segmental and somatic dysfunction of thoracic region: Secondary | ICD-10-CM | POA: Diagnosis not present

## 2020-10-11 DIAGNOSIS — D509 Iron deficiency anemia, unspecified: Secondary | ICD-10-CM | POA: Diagnosis not present

## 2020-10-18 DIAGNOSIS — D509 Iron deficiency anemia, unspecified: Secondary | ICD-10-CM | POA: Diagnosis not present

## 2020-10-19 DIAGNOSIS — B955 Unspecified streptococcus as the cause of diseases classified elsewhere: Secondary | ICD-10-CM | POA: Diagnosis not present

## 2020-10-19 DIAGNOSIS — I5033 Acute on chronic diastolic (congestive) heart failure: Secondary | ICD-10-CM | POA: Diagnosis not present

## 2020-10-19 DIAGNOSIS — R509 Fever, unspecified: Secondary | ICD-10-CM | POA: Diagnosis not present

## 2020-10-19 DIAGNOSIS — R570 Cardiogenic shock: Secondary | ICD-10-CM | POA: Diagnosis not present

## 2020-10-19 DIAGNOSIS — Z743 Need for continuous supervision: Secondary | ICD-10-CM | POA: Diagnosis not present

## 2020-10-19 DIAGNOSIS — I482 Chronic atrial fibrillation, unspecified: Secondary | ICD-10-CM | POA: Diagnosis not present

## 2020-10-19 DIAGNOSIS — Z01810 Encounter for preprocedural cardiovascular examination: Secondary | ICD-10-CM | POA: Diagnosis not present

## 2020-10-19 DIAGNOSIS — J9383 Other pneumothorax: Secondary | ICD-10-CM | POA: Diagnosis not present

## 2020-10-19 DIAGNOSIS — R918 Other nonspecific abnormal finding of lung field: Secondary | ICD-10-CM | POA: Diagnosis not present

## 2020-10-19 DIAGNOSIS — R6520 Severe sepsis without septic shock: Secondary | ICD-10-CM | POA: Diagnosis not present

## 2020-10-19 DIAGNOSIS — I330 Acute and subacute infective endocarditis: Secondary | ICD-10-CM | POA: Diagnosis not present

## 2020-10-19 DIAGNOSIS — J984 Other disorders of lung: Secondary | ICD-10-CM | POA: Diagnosis not present

## 2020-10-19 DIAGNOSIS — E46 Unspecified protein-calorie malnutrition: Secondary | ICD-10-CM | POA: Diagnosis not present

## 2020-10-19 DIAGNOSIS — R000 Tachycardia, unspecified: Secondary | ICD-10-CM | POA: Diagnosis not present

## 2020-10-19 DIAGNOSIS — I454 Nonspecific intraventricular block: Secondary | ICD-10-CM | POA: Diagnosis not present

## 2020-10-19 DIAGNOSIS — R188 Other ascites: Secondary | ICD-10-CM | POA: Diagnosis not present

## 2020-10-19 DIAGNOSIS — Z48812 Encounter for surgical aftercare following surgery on the circulatory system: Secondary | ICD-10-CM | POA: Diagnosis not present

## 2020-10-19 DIAGNOSIS — T8119XA Other postprocedural shock, initial encounter: Secondary | ICD-10-CM | POA: Diagnosis not present

## 2020-10-19 DIAGNOSIS — I341 Nonrheumatic mitral (valve) prolapse: Secondary | ICD-10-CM | POA: Diagnosis not present

## 2020-10-19 DIAGNOSIS — I130 Hypertensive heart and chronic kidney disease with heart failure and stage 1 through stage 4 chronic kidney disease, or unspecified chronic kidney disease: Secondary | ICD-10-CM | POA: Diagnosis not present

## 2020-10-19 DIAGNOSIS — Q8740 Marfan's syndrome, unspecified: Secondary | ICD-10-CM | POA: Diagnosis not present

## 2020-10-19 DIAGNOSIS — E43 Unspecified severe protein-calorie malnutrition: Secondary | ICD-10-CM | POA: Diagnosis not present

## 2020-10-19 DIAGNOSIS — A4189 Other specified sepsis: Secondary | ICD-10-CM | POA: Diagnosis not present

## 2020-10-19 DIAGNOSIS — Z20822 Contact with and (suspected) exposure to covid-19: Secondary | ICD-10-CM | POA: Diagnosis not present

## 2020-10-19 DIAGNOSIS — R69 Illness, unspecified: Secondary | ICD-10-CM | POA: Diagnosis not present

## 2020-10-19 DIAGNOSIS — Q87418 Marfan's syndrome with other cardiovascular manifestations: Secondary | ICD-10-CM | POA: Diagnosis not present

## 2020-10-19 DIAGNOSIS — I340 Nonrheumatic mitral (valve) insufficiency: Secondary | ICD-10-CM | POA: Diagnosis not present

## 2020-10-19 DIAGNOSIS — R1084 Generalized abdominal pain: Secondary | ICD-10-CM | POA: Diagnosis not present

## 2020-10-19 DIAGNOSIS — Z781 Physical restraint status: Secondary | ICD-10-CM | POA: Diagnosis not present

## 2020-10-19 DIAGNOSIS — I5030 Unspecified diastolic (congestive) heart failure: Secondary | ICD-10-CM | POA: Diagnosis not present

## 2020-10-19 DIAGNOSIS — I361 Nonrheumatic tricuspid (valve) insufficiency: Secondary | ICD-10-CM | POA: Diagnosis not present

## 2020-10-19 DIAGNOSIS — I371 Nonrheumatic pulmonary valve insufficiency: Secondary | ICD-10-CM | POA: Diagnosis not present

## 2020-10-19 DIAGNOSIS — E559 Vitamin D deficiency, unspecified: Secondary | ICD-10-CM | POA: Diagnosis not present

## 2020-10-19 DIAGNOSIS — R162 Hepatomegaly with splenomegaly, not elsewhere classified: Secondary | ICD-10-CM | POA: Diagnosis not present

## 2020-10-19 DIAGNOSIS — I5031 Acute diastolic (congestive) heart failure: Secondary | ICD-10-CM | POA: Diagnosis not present

## 2020-10-19 DIAGNOSIS — Q233 Congenital mitral insufficiency: Secondary | ICD-10-CM | POA: Diagnosis not present

## 2020-10-19 DIAGNOSIS — L89150 Pressure ulcer of sacral region, unstageable: Secondary | ICD-10-CM | POA: Diagnosis not present

## 2020-10-19 DIAGNOSIS — A408 Other streptococcal sepsis: Secondary | ICD-10-CM | POA: Diagnosis not present

## 2020-10-19 DIAGNOSIS — N390 Urinary tract infection, site not specified: Secondary | ICD-10-CM | POA: Diagnosis not present

## 2020-10-19 DIAGNOSIS — R531 Weakness: Secondary | ICD-10-CM | POA: Diagnosis not present

## 2020-10-19 DIAGNOSIS — Z952 Presence of prosthetic heart valve: Secondary | ICD-10-CM | POA: Diagnosis not present

## 2020-10-19 DIAGNOSIS — J9601 Acute respiratory failure with hypoxia: Secondary | ICD-10-CM | POA: Diagnosis not present

## 2020-10-19 DIAGNOSIS — I517 Cardiomegaly: Secondary | ICD-10-CM | POA: Diagnosis not present

## 2020-10-19 DIAGNOSIS — Z960 Presence of urogenital implants: Secondary | ICD-10-CM | POA: Diagnosis not present

## 2020-10-19 DIAGNOSIS — Z9689 Presence of other specified functional implants: Secondary | ICD-10-CM | POA: Diagnosis not present

## 2020-10-19 DIAGNOSIS — I4510 Unspecified right bundle-branch block: Secondary | ICD-10-CM | POA: Diagnosis not present

## 2020-10-19 DIAGNOSIS — R7881 Bacteremia: Secondary | ICD-10-CM | POA: Diagnosis not present

## 2020-10-19 DIAGNOSIS — Q8743 Marfan's syndrome with skeletal manifestation: Secondary | ICD-10-CM | POA: Diagnosis not present

## 2020-10-19 DIAGNOSIS — J90 Pleural effusion, not elsewhere classified: Secondary | ICD-10-CM | POA: Diagnosis not present

## 2020-10-19 DIAGNOSIS — A419 Sepsis, unspecified organism: Secondary | ICD-10-CM | POA: Diagnosis not present

## 2020-10-19 DIAGNOSIS — B9689 Other specified bacterial agents as the cause of diseases classified elsewhere: Secondary | ICD-10-CM | POA: Diagnosis not present

## 2020-10-19 DIAGNOSIS — R0602 Shortness of breath: Secondary | ICD-10-CM | POA: Diagnosis not present

## 2020-10-19 DIAGNOSIS — Z4682 Encounter for fitting and adjustment of non-vascular catheter: Secondary | ICD-10-CM | POA: Diagnosis not present

## 2020-10-19 DIAGNOSIS — J939 Pneumothorax, unspecified: Secondary | ICD-10-CM | POA: Diagnosis not present

## 2020-10-19 DIAGNOSIS — I440 Atrioventricular block, first degree: Secondary | ICD-10-CM | POA: Diagnosis not present

## 2020-10-20 DIAGNOSIS — I517 Cardiomegaly: Secondary | ICD-10-CM | POA: Diagnosis not present

## 2020-10-20 DIAGNOSIS — R0602 Shortness of breath: Secondary | ICD-10-CM | POA: Diagnosis not present

## 2020-10-20 DIAGNOSIS — I4510 Unspecified right bundle-branch block: Secondary | ICD-10-CM | POA: Diagnosis not present

## 2020-10-20 DIAGNOSIS — Q87418 Marfan's syndrome with other cardiovascular manifestations: Secondary | ICD-10-CM | POA: Diagnosis not present

## 2020-10-20 DIAGNOSIS — I341 Nonrheumatic mitral (valve) prolapse: Secondary | ICD-10-CM | POA: Diagnosis not present

## 2020-10-20 DIAGNOSIS — Q233 Congenital mitral insufficiency: Secondary | ICD-10-CM | POA: Diagnosis not present

## 2020-10-20 DIAGNOSIS — R000 Tachycardia, unspecified: Secondary | ICD-10-CM | POA: Diagnosis not present

## 2020-10-21 DIAGNOSIS — I341 Nonrheumatic mitral (valve) prolapse: Secondary | ICD-10-CM | POA: Diagnosis not present

## 2020-10-21 DIAGNOSIS — E43 Unspecified severe protein-calorie malnutrition: Secondary | ICD-10-CM | POA: Diagnosis not present

## 2020-10-21 DIAGNOSIS — I361 Nonrheumatic tricuspid (valve) insufficiency: Secondary | ICD-10-CM | POA: Diagnosis not present

## 2020-10-21 DIAGNOSIS — Q87418 Marfan's syndrome with other cardiovascular manifestations: Secondary | ICD-10-CM | POA: Diagnosis not present

## 2020-10-21 DIAGNOSIS — I340 Nonrheumatic mitral (valve) insufficiency: Secondary | ICD-10-CM | POA: Diagnosis not present

## 2020-10-21 DIAGNOSIS — E559 Vitamin D deficiency, unspecified: Secondary | ICD-10-CM | POA: Diagnosis not present

## 2020-10-21 DIAGNOSIS — I371 Nonrheumatic pulmonary valve insufficiency: Secondary | ICD-10-CM | POA: Diagnosis not present

## 2020-10-21 DIAGNOSIS — R0602 Shortness of breath: Secondary | ICD-10-CM | POA: Diagnosis not present

## 2020-10-22 DIAGNOSIS — I5030 Unspecified diastolic (congestive) heart failure: Secondary | ICD-10-CM | POA: Diagnosis not present

## 2020-10-22 DIAGNOSIS — R7881 Bacteremia: Secondary | ICD-10-CM | POA: Diagnosis not present

## 2020-10-22 DIAGNOSIS — R162 Hepatomegaly with splenomegaly, not elsewhere classified: Secondary | ICD-10-CM | POA: Diagnosis not present

## 2020-10-22 DIAGNOSIS — I4510 Unspecified right bundle-branch block: Secondary | ICD-10-CM | POA: Diagnosis not present

## 2020-10-22 DIAGNOSIS — R000 Tachycardia, unspecified: Secondary | ICD-10-CM | POA: Diagnosis not present

## 2020-10-22 DIAGNOSIS — B9689 Other specified bacterial agents as the cause of diseases classified elsewhere: Secondary | ICD-10-CM | POA: Diagnosis not present

## 2020-10-22 DIAGNOSIS — Q87418 Marfan's syndrome with other cardiovascular manifestations: Secondary | ICD-10-CM | POA: Diagnosis not present

## 2020-10-22 DIAGNOSIS — I340 Nonrheumatic mitral (valve) insufficiency: Secondary | ICD-10-CM | POA: Diagnosis not present

## 2020-10-22 DIAGNOSIS — R509 Fever, unspecified: Secondary | ICD-10-CM | POA: Diagnosis not present

## 2020-10-22 DIAGNOSIS — E43 Unspecified severe protein-calorie malnutrition: Secondary | ICD-10-CM | POA: Diagnosis not present

## 2020-10-23 DIAGNOSIS — Q233 Congenital mitral insufficiency: Secondary | ICD-10-CM | POA: Diagnosis not present

## 2020-10-23 DIAGNOSIS — I330 Acute and subacute infective endocarditis: Secondary | ICD-10-CM | POA: Diagnosis not present

## 2020-10-23 DIAGNOSIS — R7881 Bacteremia: Secondary | ICD-10-CM | POA: Diagnosis not present

## 2020-10-23 DIAGNOSIS — I5031 Acute diastolic (congestive) heart failure: Secondary | ICD-10-CM | POA: Diagnosis not present

## 2020-10-23 DIAGNOSIS — I5030 Unspecified diastolic (congestive) heart failure: Secondary | ICD-10-CM | POA: Diagnosis not present

## 2020-10-23 DIAGNOSIS — R0602 Shortness of breath: Secondary | ICD-10-CM | POA: Diagnosis not present

## 2020-10-23 DIAGNOSIS — B9689 Other specified bacterial agents as the cause of diseases classified elsewhere: Secondary | ICD-10-CM | POA: Diagnosis not present

## 2020-10-23 DIAGNOSIS — A4189 Other specified sepsis: Secondary | ICD-10-CM | POA: Diagnosis not present

## 2020-10-23 DIAGNOSIS — B955 Unspecified streptococcus as the cause of diseases classified elsewhere: Secondary | ICD-10-CM | POA: Diagnosis not present

## 2020-10-23 DIAGNOSIS — Q87418 Marfan's syndrome with other cardiovascular manifestations: Secondary | ICD-10-CM | POA: Diagnosis not present

## 2020-10-23 DIAGNOSIS — I341 Nonrheumatic mitral (valve) prolapse: Secondary | ICD-10-CM | POA: Diagnosis not present

## 2020-10-24 ENCOUNTER — Other Ambulatory Visit (HOSPITAL_BASED_OUTPATIENT_CLINIC_OR_DEPARTMENT_OTHER): Payer: Self-pay

## 2020-10-24 DIAGNOSIS — Q87418 Marfan's syndrome with other cardiovascular manifestations: Secondary | ICD-10-CM | POA: Diagnosis not present

## 2020-10-24 DIAGNOSIS — R69 Illness, unspecified: Secondary | ICD-10-CM | POA: Diagnosis not present

## 2020-10-24 DIAGNOSIS — Q233 Congenital mitral insufficiency: Secondary | ICD-10-CM | POA: Diagnosis not present

## 2020-10-24 DIAGNOSIS — I5031 Acute diastolic (congestive) heart failure: Secondary | ICD-10-CM | POA: Diagnosis not present

## 2020-10-24 DIAGNOSIS — B9689 Other specified bacterial agents as the cause of diseases classified elsewhere: Secondary | ICD-10-CM | POA: Diagnosis not present

## 2020-10-24 DIAGNOSIS — I330 Acute and subacute infective endocarditis: Secondary | ICD-10-CM | POA: Diagnosis not present

## 2020-10-24 DIAGNOSIS — A4189 Other specified sepsis: Secondary | ICD-10-CM | POA: Diagnosis not present

## 2020-10-24 DIAGNOSIS — B955 Unspecified streptococcus as the cause of diseases classified elsewhere: Secondary | ICD-10-CM | POA: Diagnosis not present

## 2020-10-24 DIAGNOSIS — R7881 Bacteremia: Secondary | ICD-10-CM | POA: Diagnosis not present

## 2020-10-24 DIAGNOSIS — I5030 Unspecified diastolic (congestive) heart failure: Secondary | ICD-10-CM | POA: Diagnosis not present

## 2020-10-25 DIAGNOSIS — B955 Unspecified streptococcus as the cause of diseases classified elsewhere: Secondary | ICD-10-CM | POA: Diagnosis not present

## 2020-10-25 DIAGNOSIS — I5031 Acute diastolic (congestive) heart failure: Secondary | ICD-10-CM | POA: Diagnosis not present

## 2020-10-25 DIAGNOSIS — R69 Illness, unspecified: Secondary | ICD-10-CM | POA: Diagnosis not present

## 2020-10-25 DIAGNOSIS — A4189 Other specified sepsis: Secondary | ICD-10-CM | POA: Diagnosis not present

## 2020-10-25 DIAGNOSIS — Q233 Congenital mitral insufficiency: Secondary | ICD-10-CM | POA: Diagnosis not present

## 2020-10-25 DIAGNOSIS — I5030 Unspecified diastolic (congestive) heart failure: Secondary | ICD-10-CM | POA: Diagnosis not present

## 2020-10-25 DIAGNOSIS — I330 Acute and subacute infective endocarditis: Secondary | ICD-10-CM | POA: Diagnosis not present

## 2020-10-25 DIAGNOSIS — B9689 Other specified bacterial agents as the cause of diseases classified elsewhere: Secondary | ICD-10-CM | POA: Diagnosis not present

## 2020-10-25 DIAGNOSIS — R7881 Bacteremia: Secondary | ICD-10-CM | POA: Diagnosis not present

## 2020-10-25 DIAGNOSIS — Q87418 Marfan's syndrome with other cardiovascular manifestations: Secondary | ICD-10-CM | POA: Diagnosis not present

## 2020-10-26 DIAGNOSIS — Q87418 Marfan's syndrome with other cardiovascular manifestations: Secondary | ICD-10-CM | POA: Diagnosis not present

## 2020-10-26 DIAGNOSIS — B9689 Other specified bacterial agents as the cause of diseases classified elsewhere: Secondary | ICD-10-CM | POA: Diagnosis not present

## 2020-10-26 DIAGNOSIS — I5030 Unspecified diastolic (congestive) heart failure: Secondary | ICD-10-CM | POA: Diagnosis not present

## 2020-10-26 DIAGNOSIS — R7881 Bacteremia: Secondary | ICD-10-CM | POA: Diagnosis not present

## 2020-10-26 DIAGNOSIS — A4189 Other specified sepsis: Secondary | ICD-10-CM | POA: Diagnosis not present

## 2020-10-26 DIAGNOSIS — B955 Unspecified streptococcus as the cause of diseases classified elsewhere: Secondary | ICD-10-CM | POA: Diagnosis not present

## 2020-10-26 DIAGNOSIS — Q233 Congenital mitral insufficiency: Secondary | ICD-10-CM | POA: Diagnosis not present

## 2020-10-26 DIAGNOSIS — I330 Acute and subacute infective endocarditis: Secondary | ICD-10-CM | POA: Diagnosis not present

## 2020-10-26 DIAGNOSIS — I5031 Acute diastolic (congestive) heart failure: Secondary | ICD-10-CM | POA: Diagnosis not present

## 2020-10-27 DIAGNOSIS — I5031 Acute diastolic (congestive) heart failure: Secondary | ICD-10-CM | POA: Diagnosis not present

## 2020-10-27 DIAGNOSIS — J984 Other disorders of lung: Secondary | ICD-10-CM | POA: Diagnosis not present

## 2020-10-27 DIAGNOSIS — R7881 Bacteremia: Secondary | ICD-10-CM | POA: Diagnosis not present

## 2020-10-27 DIAGNOSIS — B955 Unspecified streptococcus as the cause of diseases classified elsewhere: Secondary | ICD-10-CM | POA: Diagnosis not present

## 2020-10-27 DIAGNOSIS — Q233 Congenital mitral insufficiency: Secondary | ICD-10-CM | POA: Diagnosis not present

## 2020-10-27 DIAGNOSIS — I330 Acute and subacute infective endocarditis: Secondary | ICD-10-CM | POA: Diagnosis not present

## 2020-10-27 DIAGNOSIS — I5030 Unspecified diastolic (congestive) heart failure: Secondary | ICD-10-CM | POA: Diagnosis not present

## 2020-10-27 DIAGNOSIS — Q87418 Marfan's syndrome with other cardiovascular manifestations: Secondary | ICD-10-CM | POA: Diagnosis not present

## 2020-10-27 DIAGNOSIS — A4189 Other specified sepsis: Secondary | ICD-10-CM | POA: Diagnosis not present

## 2020-10-27 DIAGNOSIS — B9689 Other specified bacterial agents as the cause of diseases classified elsewhere: Secondary | ICD-10-CM | POA: Diagnosis not present

## 2020-10-28 DIAGNOSIS — Q233 Congenital mitral insufficiency: Secondary | ICD-10-CM | POA: Diagnosis not present

## 2020-10-28 DIAGNOSIS — Q87418 Marfan's syndrome with other cardiovascular manifestations: Secondary | ICD-10-CM | POA: Diagnosis not present

## 2020-10-28 DIAGNOSIS — I341 Nonrheumatic mitral (valve) prolapse: Secondary | ICD-10-CM | POA: Diagnosis not present

## 2020-10-28 DIAGNOSIS — I5033 Acute on chronic diastolic (congestive) heart failure: Secondary | ICD-10-CM | POA: Diagnosis not present

## 2020-10-28 DIAGNOSIS — A4189 Other specified sepsis: Secondary | ICD-10-CM | POA: Diagnosis not present

## 2020-10-28 DIAGNOSIS — R7881 Bacteremia: Secondary | ICD-10-CM | POA: Diagnosis not present

## 2020-10-28 DIAGNOSIS — I330 Acute and subacute infective endocarditis: Secondary | ICD-10-CM | POA: Diagnosis not present

## 2020-10-28 DIAGNOSIS — B9689 Other specified bacterial agents as the cause of diseases classified elsewhere: Secondary | ICD-10-CM | POA: Diagnosis not present

## 2020-10-29 DIAGNOSIS — Q87418 Marfan's syndrome with other cardiovascular manifestations: Secondary | ICD-10-CM | POA: Diagnosis not present

## 2020-10-29 DIAGNOSIS — I5031 Acute diastolic (congestive) heart failure: Secondary | ICD-10-CM | POA: Diagnosis not present

## 2020-10-29 DIAGNOSIS — R6520 Severe sepsis without septic shock: Secondary | ICD-10-CM | POA: Diagnosis not present

## 2020-10-29 DIAGNOSIS — R7881 Bacteremia: Secondary | ICD-10-CM | POA: Diagnosis not present

## 2020-10-29 DIAGNOSIS — I341 Nonrheumatic mitral (valve) prolapse: Secondary | ICD-10-CM | POA: Diagnosis not present

## 2020-10-29 DIAGNOSIS — B9689 Other specified bacterial agents as the cause of diseases classified elsewhere: Secondary | ICD-10-CM | POA: Diagnosis not present

## 2020-10-29 DIAGNOSIS — Q233 Congenital mitral insufficiency: Secondary | ICD-10-CM | POA: Diagnosis not present

## 2020-10-29 DIAGNOSIS — A408 Other streptococcal sepsis: Secondary | ICD-10-CM | POA: Diagnosis not present

## 2020-10-29 DIAGNOSIS — I330 Acute and subacute infective endocarditis: Secondary | ICD-10-CM | POA: Diagnosis not present

## 2020-10-30 ENCOUNTER — Other Ambulatory Visit (HOSPITAL_COMMUNITY): Payer: Self-pay | Admitting: Pediatrics

## 2020-10-30 DIAGNOSIS — I340 Nonrheumatic mitral (valve) insufficiency: Secondary | ICD-10-CM | POA: Diagnosis not present

## 2020-10-30 DIAGNOSIS — T8119XA Other postprocedural shock, initial encounter: Secondary | ICD-10-CM | POA: Diagnosis not present

## 2020-10-30 DIAGNOSIS — I130 Hypertensive heart and chronic kidney disease with heart failure and stage 1 through stage 4 chronic kidney disease, or unspecified chronic kidney disease: Secondary | ICD-10-CM | POA: Diagnosis not present

## 2020-10-30 DIAGNOSIS — J9601 Acute respiratory failure with hypoxia: Secondary | ICD-10-CM | POA: Diagnosis not present

## 2020-10-30 DIAGNOSIS — I330 Acute and subacute infective endocarditis: Secondary | ICD-10-CM | POA: Diagnosis not present

## 2020-10-30 DIAGNOSIS — I5030 Unspecified diastolic (congestive) heart failure: Secondary | ICD-10-CM | POA: Diagnosis not present

## 2020-10-30 DIAGNOSIS — I5031 Acute diastolic (congestive) heart failure: Secondary | ICD-10-CM | POA: Diagnosis not present

## 2020-10-30 DIAGNOSIS — I341 Nonrheumatic mitral (valve) prolapse: Secondary | ICD-10-CM | POA: Diagnosis not present

## 2020-10-30 DIAGNOSIS — Q87418 Marfan's syndrome with other cardiovascular manifestations: Secondary | ICD-10-CM | POA: Diagnosis not present

## 2020-10-30 DIAGNOSIS — E43 Unspecified severe protein-calorie malnutrition: Secondary | ICD-10-CM | POA: Diagnosis not present

## 2020-10-30 DIAGNOSIS — B9689 Other specified bacterial agents as the cause of diseases classified elsewhere: Secondary | ICD-10-CM | POA: Diagnosis not present

## 2020-10-30 DIAGNOSIS — R570 Cardiogenic shock: Secondary | ICD-10-CM | POA: Diagnosis not present

## 2020-10-30 DIAGNOSIS — Z20822 Contact with and (suspected) exposure to covid-19: Secondary | ICD-10-CM | POA: Diagnosis not present

## 2020-10-30 DIAGNOSIS — A419 Sepsis, unspecified organism: Secondary | ICD-10-CM | POA: Diagnosis not present

## 2020-10-30 DIAGNOSIS — Z781 Physical restraint status: Secondary | ICD-10-CM | POA: Diagnosis not present

## 2020-10-30 DIAGNOSIS — Z01810 Encounter for preprocedural cardiovascular examination: Secondary | ICD-10-CM | POA: Diagnosis not present

## 2020-10-31 DIAGNOSIS — Z781 Physical restraint status: Secondary | ICD-10-CM | POA: Diagnosis not present

## 2020-10-31 DIAGNOSIS — R570 Cardiogenic shock: Secondary | ICD-10-CM | POA: Diagnosis not present

## 2020-10-31 DIAGNOSIS — E43 Unspecified severe protein-calorie malnutrition: Secondary | ICD-10-CM | POA: Diagnosis not present

## 2020-10-31 DIAGNOSIS — R7881 Bacteremia: Secondary | ICD-10-CM | POA: Diagnosis not present

## 2020-10-31 DIAGNOSIS — I059 Rheumatic mitral valve disease, unspecified: Secondary | ICD-10-CM | POA: Diagnosis not present

## 2020-10-31 DIAGNOSIS — I340 Nonrheumatic mitral (valve) insufficiency: Secondary | ICD-10-CM | POA: Diagnosis not present

## 2020-10-31 DIAGNOSIS — R6510 Systemic inflammatory response syndrome (SIRS) of non-infectious origin without acute organ dysfunction: Secondary | ICD-10-CM | POA: Diagnosis not present

## 2020-10-31 DIAGNOSIS — J9601 Acute respiratory failure with hypoxia: Secondary | ICD-10-CM | POA: Diagnosis not present

## 2020-10-31 DIAGNOSIS — I10 Essential (primary) hypertension: Secondary | ICD-10-CM | POA: Diagnosis not present

## 2020-10-31 DIAGNOSIS — Q875 Other congenital malformation syndromes with other skeletal changes: Secondary | ICD-10-CM | POA: Diagnosis not present

## 2020-10-31 DIAGNOSIS — I330 Acute and subacute infective endocarditis: Secondary | ICD-10-CM | POA: Diagnosis not present

## 2020-10-31 DIAGNOSIS — Q211 Atrial septal defect: Secondary | ICD-10-CM | POA: Diagnosis not present

## 2020-10-31 DIAGNOSIS — B9689 Other specified bacterial agents as the cause of diseases classified elsewhere: Secondary | ICD-10-CM | POA: Diagnosis not present

## 2020-10-31 DIAGNOSIS — I38 Endocarditis, valve unspecified: Secondary | ICD-10-CM | POA: Diagnosis not present

## 2020-10-31 DIAGNOSIS — I341 Nonrheumatic mitral (valve) prolapse: Secondary | ICD-10-CM | POA: Diagnosis not present

## 2020-10-31 DIAGNOSIS — T8119XA Other postprocedural shock, initial encounter: Secondary | ICD-10-CM | POA: Diagnosis not present

## 2020-10-31 DIAGNOSIS — Z20822 Contact with and (suspected) exposure to covid-19: Secondary | ICD-10-CM | POA: Diagnosis not present

## 2020-10-31 DIAGNOSIS — I77810 Thoracic aortic ectasia: Secondary | ICD-10-CM | POA: Diagnosis not present

## 2020-10-31 DIAGNOSIS — Q8740 Marfan's syndrome, unspecified: Secondary | ICD-10-CM | POA: Diagnosis not present

## 2020-10-31 DIAGNOSIS — R0689 Other abnormalities of breathing: Secondary | ICD-10-CM | POA: Diagnosis not present

## 2020-10-31 DIAGNOSIS — I5031 Acute diastolic (congestive) heart failure: Secondary | ICD-10-CM | POA: Diagnosis not present

## 2020-10-31 DIAGNOSIS — Q676 Pectus excavatum: Secondary | ICD-10-CM | POA: Diagnosis not present

## 2020-10-31 DIAGNOSIS — Z4659 Encounter for fitting and adjustment of other gastrointestinal appliance and device: Secondary | ICD-10-CM | POA: Diagnosis not present

## 2020-10-31 DIAGNOSIS — Q87418 Marfan's syndrome with other cardiovascular manifestations: Secondary | ICD-10-CM | POA: Diagnosis not present

## 2020-10-31 MED FILL — LOSARTAN POTASSIUM 50 MG TA: 50 | 30 days supply | Qty: 60 | Fill #0

## 2020-11-01 DIAGNOSIS — B9689 Other specified bacterial agents as the cause of diseases classified elsewhere: Secondary | ICD-10-CM | POA: Diagnosis not present

## 2020-11-01 DIAGNOSIS — I110 Hypertensive heart disease with heart failure: Secondary | ICD-10-CM | POA: Diagnosis not present

## 2020-11-01 DIAGNOSIS — D7582 Heparin induced thrombocytopenia (HIT): Secondary | ICD-10-CM | POA: Diagnosis not present

## 2020-11-01 DIAGNOSIS — J9602 Acute respiratory failure with hypercapnia: Secondary | ICD-10-CM | POA: Diagnosis not present

## 2020-11-01 DIAGNOSIS — I97638 Postprocedural hematoma of a circulatory system organ or structure following other circulatory system procedure: Secondary | ICD-10-CM | POA: Diagnosis not present

## 2020-11-01 DIAGNOSIS — E872 Acidosis: Secondary | ICD-10-CM | POA: Diagnosis not present

## 2020-11-01 DIAGNOSIS — I371 Nonrheumatic pulmonary valve insufficiency: Secondary | ICD-10-CM | POA: Diagnosis not present

## 2020-11-01 DIAGNOSIS — L89106 Pressure-induced deep tissue damage of unspecified part of back: Secondary | ICD-10-CM | POA: Diagnosis not present

## 2020-11-01 DIAGNOSIS — I341 Nonrheumatic mitral (valve) prolapse: Secondary | ICD-10-CM | POA: Diagnosis not present

## 2020-11-01 DIAGNOSIS — I059 Rheumatic mitral valve disease, unspecified: Secondary | ICD-10-CM | POA: Diagnosis not present

## 2020-11-01 DIAGNOSIS — I340 Nonrheumatic mitral (valve) insufficiency: Secondary | ICD-10-CM | POA: Diagnosis not present

## 2020-11-01 DIAGNOSIS — I482 Chronic atrial fibrillation, unspecified: Secondary | ICD-10-CM | POA: Diagnosis not present

## 2020-11-01 DIAGNOSIS — I361 Nonrheumatic tricuspid (valve) insufficiency: Secondary | ICD-10-CM | POA: Diagnosis not present

## 2020-11-01 DIAGNOSIS — Z952 Presence of prosthetic heart valve: Secondary | ICD-10-CM | POA: Diagnosis not present

## 2020-11-01 DIAGNOSIS — Z4682 Encounter for fitting and adjustment of non-vascular catheter: Secondary | ICD-10-CM | POA: Diagnosis not present

## 2020-11-01 DIAGNOSIS — B954 Other streptococcus as the cause of diseases classified elsewhere: Secondary | ICD-10-CM | POA: Diagnosis not present

## 2020-11-01 DIAGNOSIS — Z781 Physical restraint status: Secondary | ICD-10-CM | POA: Diagnosis not present

## 2020-11-01 DIAGNOSIS — K8020 Calculus of gallbladder without cholecystitis without obstruction: Secondary | ICD-10-CM | POA: Diagnosis not present

## 2020-11-01 DIAGNOSIS — R1310 Dysphagia, unspecified: Secondary | ICD-10-CM | POA: Diagnosis not present

## 2020-11-01 DIAGNOSIS — R000 Tachycardia, unspecified: Secondary | ICD-10-CM | POA: Diagnosis not present

## 2020-11-01 DIAGNOSIS — Q8740 Marfan's syndrome, unspecified: Secondary | ICD-10-CM | POA: Diagnosis not present

## 2020-11-01 DIAGNOSIS — J9600 Acute respiratory failure, unspecified whether with hypoxia or hypercapnia: Secondary | ICD-10-CM | POA: Diagnosis not present

## 2020-11-01 DIAGNOSIS — D229 Melanocytic nevi, unspecified: Secondary | ICD-10-CM | POA: Diagnosis not present

## 2020-11-01 DIAGNOSIS — J90 Pleural effusion, not elsewhere classified: Secondary | ICD-10-CM | POA: Diagnosis not present

## 2020-11-01 DIAGNOSIS — I5082 Biventricular heart failure: Secondary | ICD-10-CM | POA: Diagnosis not present

## 2020-11-01 DIAGNOSIS — Z6823 Body mass index (BMI) 23.0-23.9, adult: Secondary | ICD-10-CM | POA: Diagnosis not present

## 2020-11-01 DIAGNOSIS — I330 Acute and subacute infective endocarditis: Secondary | ICD-10-CM | POA: Diagnosis not present

## 2020-11-01 DIAGNOSIS — D62 Acute posthemorrhagic anemia: Secondary | ICD-10-CM | POA: Diagnosis not present

## 2020-11-01 DIAGNOSIS — R6510 Systemic inflammatory response syndrome (SIRS) of non-infectious origin without acute organ dysfunction: Secondary | ICD-10-CM | POA: Diagnosis not present

## 2020-11-01 DIAGNOSIS — Z9981 Dependence on supplemental oxygen: Secondary | ICD-10-CM | POA: Diagnosis not present

## 2020-11-01 DIAGNOSIS — Z9689 Presence of other specified functional implants: Secondary | ICD-10-CM | POA: Diagnosis not present

## 2020-11-01 DIAGNOSIS — D72829 Elevated white blood cell count, unspecified: Secondary | ICD-10-CM | POA: Diagnosis not present

## 2020-11-01 DIAGNOSIS — D696 Thrombocytopenia, unspecified: Secondary | ICD-10-CM | POA: Diagnosis not present

## 2020-11-01 DIAGNOSIS — T8119XA Other postprocedural shock, initial encounter: Secondary | ICD-10-CM | POA: Diagnosis not present

## 2020-11-01 DIAGNOSIS — J948 Other specified pleural conditions: Secondary | ICD-10-CM | POA: Diagnosis not present

## 2020-11-01 DIAGNOSIS — I4510 Unspecified right bundle-branch block: Secondary | ICD-10-CM | POA: Diagnosis not present

## 2020-11-01 DIAGNOSIS — R570 Cardiogenic shock: Secondary | ICD-10-CM | POA: Diagnosis not present

## 2020-11-01 DIAGNOSIS — Q676 Pectus excavatum: Secondary | ICD-10-CM | POA: Diagnosis not present

## 2020-11-01 DIAGNOSIS — I4891 Unspecified atrial fibrillation: Secondary | ICD-10-CM | POA: Diagnosis not present

## 2020-11-01 DIAGNOSIS — J9383 Other pneumothorax: Secondary | ICD-10-CM | POA: Diagnosis not present

## 2020-11-01 DIAGNOSIS — I97190 Other postprocedural cardiac functional disturbances following cardiac surgery: Secondary | ICD-10-CM | POA: Diagnosis not present

## 2020-11-01 DIAGNOSIS — R509 Fever, unspecified: Secondary | ICD-10-CM | POA: Diagnosis not present

## 2020-11-01 DIAGNOSIS — J9601 Acute respiratory failure with hypoxia: Secondary | ICD-10-CM | POA: Diagnosis not present

## 2020-11-01 DIAGNOSIS — Q87418 Marfan's syndrome with other cardiovascular manifestations: Secondary | ICD-10-CM | POA: Diagnosis not present

## 2020-11-01 DIAGNOSIS — R0689 Other abnormalities of breathing: Secondary | ICD-10-CM | POA: Diagnosis not present

## 2020-11-01 DIAGNOSIS — I77810 Thoracic aortic ectasia: Secondary | ICD-10-CM | POA: Diagnosis not present

## 2020-11-01 DIAGNOSIS — I351 Nonrheumatic aortic (valve) insufficiency: Secondary | ICD-10-CM | POA: Diagnosis not present

## 2020-11-01 DIAGNOSIS — I472 Ventricular tachycardia: Secondary | ICD-10-CM | POA: Diagnosis not present

## 2020-11-01 DIAGNOSIS — L8946 Pressure-induced deep tissue damage of contiguous site of back, buttock and hip: Secondary | ICD-10-CM | POA: Diagnosis not present

## 2020-11-01 DIAGNOSIS — I440 Atrioventricular block, first degree: Secondary | ICD-10-CM | POA: Diagnosis not present

## 2020-11-01 DIAGNOSIS — I471 Supraventricular tachycardia: Secondary | ICD-10-CM | POA: Diagnosis not present

## 2020-11-01 DIAGNOSIS — Z20822 Contact with and (suspected) exposure to covid-19: Secondary | ICD-10-CM | POA: Diagnosis not present

## 2020-11-01 DIAGNOSIS — R140 Abdominal distension (gaseous): Secondary | ICD-10-CM | POA: Diagnosis not present

## 2020-11-01 DIAGNOSIS — R0602 Shortness of breath: Secondary | ICD-10-CM | POA: Diagnosis not present

## 2020-11-01 DIAGNOSIS — N179 Acute kidney failure, unspecified: Secondary | ICD-10-CM | POA: Diagnosis not present

## 2020-11-01 DIAGNOSIS — Z452 Encounter for adjustment and management of vascular access device: Secondary | ICD-10-CM | POA: Diagnosis not present

## 2020-11-01 DIAGNOSIS — L89156 Pressure-induced deep tissue damage of sacral region: Secondary | ICD-10-CM | POA: Diagnosis not present

## 2020-11-01 DIAGNOSIS — J95811 Postprocedural pneumothorax: Secondary | ICD-10-CM | POA: Diagnosis not present

## 2020-11-01 DIAGNOSIS — S270XXA Traumatic pneumothorax, initial encounter: Secondary | ICD-10-CM | POA: Diagnosis not present

## 2020-11-01 DIAGNOSIS — I10 Essential (primary) hypertension: Secondary | ICD-10-CM | POA: Diagnosis not present

## 2020-11-01 DIAGNOSIS — I5031 Acute diastolic (congestive) heart failure: Secondary | ICD-10-CM | POA: Diagnosis not present

## 2020-11-01 DIAGNOSIS — R7881 Bacteremia: Secondary | ICD-10-CM | POA: Diagnosis not present

## 2020-11-01 DIAGNOSIS — J939 Pneumothorax, unspecified: Secondary | ICD-10-CM | POA: Diagnosis not present

## 2020-11-01 DIAGNOSIS — I509 Heart failure, unspecified: Secondary | ICD-10-CM | POA: Diagnosis not present

## 2020-11-01 DIAGNOSIS — R197 Diarrhea, unspecified: Secondary | ICD-10-CM | POA: Diagnosis not present

## 2020-11-01 DIAGNOSIS — E43 Unspecified severe protein-calorie malnutrition: Secondary | ICD-10-CM | POA: Diagnosis not present

## 2020-11-01 DIAGNOSIS — Z4659 Encounter for fitting and adjustment of other gastrointestinal appliance and device: Secondary | ICD-10-CM | POA: Diagnosis not present

## 2020-11-01 DIAGNOSIS — R188 Other ascites: Secondary | ICD-10-CM | POA: Diagnosis not present

## 2020-11-01 DIAGNOSIS — R918 Other nonspecific abnormal finding of lung field: Secondary | ICD-10-CM | POA: Diagnosis not present

## 2020-11-02 DIAGNOSIS — Z20822 Contact with and (suspected) exposure to covid-19: Secondary | ICD-10-CM | POA: Diagnosis not present

## 2020-11-02 DIAGNOSIS — I509 Heart failure, unspecified: Secondary | ICD-10-CM | POA: Diagnosis not present

## 2020-11-02 DIAGNOSIS — Z452 Encounter for adjustment and management of vascular access device: Secondary | ICD-10-CM | POA: Diagnosis not present

## 2020-11-02 DIAGNOSIS — S270XXA Traumatic pneumothorax, initial encounter: Secondary | ICD-10-CM | POA: Diagnosis not present

## 2020-11-02 DIAGNOSIS — I371 Nonrheumatic pulmonary valve insufficiency: Secondary | ICD-10-CM | POA: Diagnosis not present

## 2020-11-02 DIAGNOSIS — I97638 Postprocedural hematoma of a circulatory system organ or structure following other circulatory system procedure: Secondary | ICD-10-CM | POA: Diagnosis not present

## 2020-11-02 DIAGNOSIS — Z952 Presence of prosthetic heart valve: Secondary | ICD-10-CM | POA: Diagnosis not present

## 2020-11-02 DIAGNOSIS — J90 Pleural effusion, not elsewhere classified: Secondary | ICD-10-CM | POA: Diagnosis not present

## 2020-11-02 DIAGNOSIS — I361 Nonrheumatic tricuspid (valve) insufficiency: Secondary | ICD-10-CM | POA: Diagnosis not present

## 2020-11-02 DIAGNOSIS — N179 Acute kidney failure, unspecified: Secondary | ICD-10-CM | POA: Diagnosis not present

## 2020-11-02 DIAGNOSIS — I97190 Other postprocedural cardiac functional disturbances following cardiac surgery: Secondary | ICD-10-CM | POA: Diagnosis not present

## 2020-11-02 DIAGNOSIS — E872 Acidosis: Secondary | ICD-10-CM | POA: Diagnosis not present

## 2020-11-02 DIAGNOSIS — D696 Thrombocytopenia, unspecified: Secondary | ICD-10-CM | POA: Diagnosis not present

## 2020-11-02 DIAGNOSIS — I341 Nonrheumatic mitral (valve) prolapse: Secondary | ICD-10-CM | POA: Diagnosis not present

## 2020-11-02 DIAGNOSIS — I471 Supraventricular tachycardia: Secondary | ICD-10-CM | POA: Diagnosis not present

## 2020-11-02 DIAGNOSIS — I059 Rheumatic mitral valve disease, unspecified: Secondary | ICD-10-CM | POA: Diagnosis not present

## 2020-11-02 DIAGNOSIS — I10 Essential (primary) hypertension: Secondary | ICD-10-CM | POA: Diagnosis not present

## 2020-11-02 DIAGNOSIS — Z6823 Body mass index (BMI) 23.0-23.9, adult: Secondary | ICD-10-CM | POA: Diagnosis not present

## 2020-11-02 DIAGNOSIS — Z9981 Dependence on supplemental oxygen: Secondary | ICD-10-CM | POA: Diagnosis not present

## 2020-11-02 DIAGNOSIS — Q676 Pectus excavatum: Secondary | ICD-10-CM | POA: Diagnosis not present

## 2020-11-02 DIAGNOSIS — R1310 Dysphagia, unspecified: Secondary | ICD-10-CM | POA: Diagnosis not present

## 2020-11-02 DIAGNOSIS — Z781 Physical restraint status: Secondary | ICD-10-CM | POA: Diagnosis not present

## 2020-11-02 DIAGNOSIS — I77810 Thoracic aortic ectasia: Secondary | ICD-10-CM | POA: Diagnosis not present

## 2020-11-02 DIAGNOSIS — T8119XA Other postprocedural shock, initial encounter: Secondary | ICD-10-CM | POA: Diagnosis not present

## 2020-11-02 DIAGNOSIS — I340 Nonrheumatic mitral (valve) insufficiency: Secondary | ICD-10-CM | POA: Diagnosis not present

## 2020-11-02 DIAGNOSIS — R0689 Other abnormalities of breathing: Secondary | ICD-10-CM | POA: Diagnosis not present

## 2020-11-02 DIAGNOSIS — R000 Tachycardia, unspecified: Secondary | ICD-10-CM | POA: Diagnosis not present

## 2020-11-02 DIAGNOSIS — R918 Other nonspecific abnormal finding of lung field: Secondary | ICD-10-CM | POA: Diagnosis not present

## 2020-11-02 DIAGNOSIS — Z9689 Presence of other specified functional implants: Secondary | ICD-10-CM | POA: Diagnosis not present

## 2020-11-02 DIAGNOSIS — J9601 Acute respiratory failure with hypoxia: Secondary | ICD-10-CM | POA: Diagnosis not present

## 2020-11-02 DIAGNOSIS — R197 Diarrhea, unspecified: Secondary | ICD-10-CM | POA: Diagnosis not present

## 2020-11-02 DIAGNOSIS — D72829 Elevated white blood cell count, unspecified: Secondary | ICD-10-CM | POA: Diagnosis not present

## 2020-11-02 DIAGNOSIS — I330 Acute and subacute infective endocarditis: Secondary | ICD-10-CM | POA: Diagnosis not present

## 2020-11-02 DIAGNOSIS — I5031 Acute diastolic (congestive) heart failure: Secondary | ICD-10-CM | POA: Diagnosis not present

## 2020-11-02 DIAGNOSIS — R509 Fever, unspecified: Secondary | ICD-10-CM | POA: Diagnosis not present

## 2020-11-02 DIAGNOSIS — J939 Pneumothorax, unspecified: Secondary | ICD-10-CM | POA: Diagnosis not present

## 2020-11-02 DIAGNOSIS — Z4682 Encounter for fitting and adjustment of non-vascular catheter: Secondary | ICD-10-CM | POA: Diagnosis not present

## 2020-11-02 DIAGNOSIS — D229 Melanocytic nevi, unspecified: Secondary | ICD-10-CM | POA: Diagnosis not present

## 2020-11-02 DIAGNOSIS — Q8740 Marfan's syndrome, unspecified: Secondary | ICD-10-CM | POA: Diagnosis not present

## 2020-11-02 DIAGNOSIS — R6510 Systemic inflammatory response syndrome (SIRS) of non-infectious origin without acute organ dysfunction: Secondary | ICD-10-CM | POA: Diagnosis not present

## 2020-11-02 DIAGNOSIS — R570 Cardiogenic shock: Secondary | ICD-10-CM | POA: Diagnosis not present

## 2020-11-02 DIAGNOSIS — Z4659 Encounter for fitting and adjustment of other gastrointestinal appliance and device: Secondary | ICD-10-CM | POA: Diagnosis not present

## 2020-11-02 DIAGNOSIS — R7881 Bacteremia: Secondary | ICD-10-CM | POA: Diagnosis not present

## 2020-11-02 DIAGNOSIS — I440 Atrioventricular block, first degree: Secondary | ICD-10-CM | POA: Diagnosis not present

## 2020-11-02 DIAGNOSIS — I110 Hypertensive heart disease with heart failure: Secondary | ICD-10-CM | POA: Diagnosis not present

## 2020-11-02 DIAGNOSIS — R188 Other ascites: Secondary | ICD-10-CM | POA: Diagnosis not present

## 2020-11-02 DIAGNOSIS — D62 Acute posthemorrhagic anemia: Secondary | ICD-10-CM | POA: Diagnosis not present

## 2020-11-02 DIAGNOSIS — R0602 Shortness of breath: Secondary | ICD-10-CM | POA: Diagnosis not present

## 2020-11-02 DIAGNOSIS — J95811 Postprocedural pneumothorax: Secondary | ICD-10-CM | POA: Diagnosis not present

## 2020-11-02 DIAGNOSIS — L89106 Pressure-induced deep tissue damage of unspecified part of back: Secondary | ICD-10-CM | POA: Diagnosis not present

## 2020-11-02 DIAGNOSIS — R140 Abdominal distension (gaseous): Secondary | ICD-10-CM | POA: Diagnosis not present

## 2020-11-02 DIAGNOSIS — I5082 Biventricular heart failure: Secondary | ICD-10-CM | POA: Diagnosis not present

## 2020-11-02 DIAGNOSIS — B954 Other streptococcus as the cause of diseases classified elsewhere: Secondary | ICD-10-CM | POA: Diagnosis not present

## 2020-11-02 DIAGNOSIS — K8020 Calculus of gallbladder without cholecystitis without obstruction: Secondary | ICD-10-CM | POA: Diagnosis not present

## 2020-11-02 DIAGNOSIS — E43 Unspecified severe protein-calorie malnutrition: Secondary | ICD-10-CM | POA: Diagnosis not present

## 2020-11-02 DIAGNOSIS — J9600 Acute respiratory failure, unspecified whether with hypoxia or hypercapnia: Secondary | ICD-10-CM | POA: Diagnosis not present

## 2020-11-02 DIAGNOSIS — I472 Ventricular tachycardia: Secondary | ICD-10-CM | POA: Diagnosis not present

## 2020-11-02 DIAGNOSIS — I4891 Unspecified atrial fibrillation: Secondary | ICD-10-CM | POA: Diagnosis not present

## 2020-11-02 DIAGNOSIS — I351 Nonrheumatic aortic (valve) insufficiency: Secondary | ICD-10-CM | POA: Diagnosis not present

## 2020-11-02 DIAGNOSIS — Q87418 Marfan's syndrome with other cardiovascular manifestations: Secondary | ICD-10-CM | POA: Diagnosis not present

## 2020-11-02 DIAGNOSIS — L89156 Pressure-induced deep tissue damage of sacral region: Secondary | ICD-10-CM | POA: Diagnosis not present

## 2020-11-02 DIAGNOSIS — L8946 Pressure-induced deep tissue damage of contiguous site of back, buttock and hip: Secondary | ICD-10-CM | POA: Diagnosis not present

## 2020-11-02 DIAGNOSIS — J948 Other specified pleural conditions: Secondary | ICD-10-CM | POA: Diagnosis not present

## 2020-11-02 DIAGNOSIS — I4510 Unspecified right bundle-branch block: Secondary | ICD-10-CM | POA: Diagnosis not present

## 2020-11-02 DIAGNOSIS — J9383 Other pneumothorax: Secondary | ICD-10-CM | POA: Diagnosis not present

## 2020-11-02 DIAGNOSIS — D7582 Heparin induced thrombocytopenia (HIT): Secondary | ICD-10-CM | POA: Diagnosis not present

## 2020-11-02 DIAGNOSIS — I482 Chronic atrial fibrillation, unspecified: Secondary | ICD-10-CM | POA: Diagnosis not present

## 2020-11-02 DIAGNOSIS — J9602 Acute respiratory failure with hypercapnia: Secondary | ICD-10-CM | POA: Diagnosis not present

## 2020-11-02 DIAGNOSIS — B9689 Other specified bacterial agents as the cause of diseases classified elsewhere: Secondary | ICD-10-CM | POA: Diagnosis not present

## 2020-11-03 DIAGNOSIS — J90 Pleural effusion, not elsewhere classified: Secondary | ICD-10-CM | POA: Diagnosis not present

## 2020-11-03 DIAGNOSIS — R0689 Other abnormalities of breathing: Secondary | ICD-10-CM | POA: Diagnosis not present

## 2020-11-03 DIAGNOSIS — I059 Rheumatic mitral valve disease, unspecified: Secondary | ICD-10-CM | POA: Diagnosis not present

## 2020-11-03 DIAGNOSIS — Q8740 Marfan's syndrome, unspecified: Secondary | ICD-10-CM | POA: Diagnosis not present

## 2020-11-04 DIAGNOSIS — R509 Fever, unspecified: Secondary | ICD-10-CM | POA: Diagnosis not present

## 2020-11-04 DIAGNOSIS — J939 Pneumothorax, unspecified: Secondary | ICD-10-CM | POA: Diagnosis not present

## 2020-11-04 DIAGNOSIS — B9689 Other specified bacterial agents as the cause of diseases classified elsewhere: Secondary | ICD-10-CM | POA: Diagnosis not present

## 2020-11-04 DIAGNOSIS — I97190 Other postprocedural cardiac functional disturbances following cardiac surgery: Secondary | ICD-10-CM | POA: Diagnosis not present

## 2020-11-04 DIAGNOSIS — Q8740 Marfan's syndrome, unspecified: Secondary | ICD-10-CM | POA: Diagnosis not present

## 2020-11-04 DIAGNOSIS — R000 Tachycardia, unspecified: Secondary | ICD-10-CM | POA: Diagnosis not present

## 2020-11-04 DIAGNOSIS — R188 Other ascites: Secondary | ICD-10-CM | POA: Diagnosis not present

## 2020-11-04 DIAGNOSIS — D72829 Elevated white blood cell count, unspecified: Secondary | ICD-10-CM | POA: Diagnosis not present

## 2020-11-04 DIAGNOSIS — I4510 Unspecified right bundle-branch block: Secondary | ICD-10-CM | POA: Diagnosis not present

## 2020-11-04 DIAGNOSIS — S270XXA Traumatic pneumothorax, initial encounter: Secondary | ICD-10-CM | POA: Diagnosis not present

## 2020-11-04 DIAGNOSIS — R7881 Bacteremia: Secondary | ICD-10-CM | POA: Diagnosis not present

## 2020-11-04 DIAGNOSIS — I059 Rheumatic mitral valve disease, unspecified: Secondary | ICD-10-CM | POA: Diagnosis not present

## 2020-11-04 DIAGNOSIS — Z452 Encounter for adjustment and management of vascular access device: Secondary | ICD-10-CM | POA: Diagnosis not present

## 2020-11-04 DIAGNOSIS — Q87418 Marfan's syndrome with other cardiovascular manifestations: Secondary | ICD-10-CM | POA: Diagnosis not present

## 2020-11-04 DIAGNOSIS — I482 Chronic atrial fibrillation, unspecified: Secondary | ICD-10-CM | POA: Diagnosis not present

## 2020-11-04 DIAGNOSIS — I472 Ventricular tachycardia: Secondary | ICD-10-CM | POA: Diagnosis not present

## 2020-11-04 DIAGNOSIS — I5082 Biventricular heart failure: Secondary | ICD-10-CM | POA: Diagnosis not present

## 2020-11-04 DIAGNOSIS — I471 Supraventricular tachycardia: Secondary | ICD-10-CM | POA: Diagnosis not present

## 2020-11-04 DIAGNOSIS — J9601 Acute respiratory failure with hypoxia: Secondary | ICD-10-CM | POA: Diagnosis not present

## 2020-11-04 DIAGNOSIS — I341 Nonrheumatic mitral (valve) prolapse: Secondary | ICD-10-CM | POA: Diagnosis not present

## 2020-11-04 DIAGNOSIS — I4891 Unspecified atrial fibrillation: Secondary | ICD-10-CM | POA: Diagnosis not present

## 2020-11-04 DIAGNOSIS — R570 Cardiogenic shock: Secondary | ICD-10-CM | POA: Diagnosis not present

## 2020-11-04 DIAGNOSIS — R0602 Shortness of breath: Secondary | ICD-10-CM | POA: Diagnosis not present

## 2020-11-05 DIAGNOSIS — I5082 Biventricular heart failure: Secondary | ICD-10-CM | POA: Diagnosis not present

## 2020-11-05 DIAGNOSIS — I4891 Unspecified atrial fibrillation: Secondary | ICD-10-CM | POA: Diagnosis not present

## 2020-11-05 DIAGNOSIS — N179 Acute kidney failure, unspecified: Secondary | ICD-10-CM | POA: Diagnosis not present

## 2020-11-05 DIAGNOSIS — R0602 Shortness of breath: Secondary | ICD-10-CM | POA: Diagnosis not present

## 2020-11-05 DIAGNOSIS — R509 Fever, unspecified: Secondary | ICD-10-CM | POA: Diagnosis not present

## 2020-11-05 DIAGNOSIS — D72829 Elevated white blood cell count, unspecified: Secondary | ICD-10-CM | POA: Diagnosis not present

## 2020-11-05 DIAGNOSIS — R7881 Bacteremia: Secondary | ICD-10-CM | POA: Diagnosis not present

## 2020-11-05 DIAGNOSIS — Z4659 Encounter for fitting and adjustment of other gastrointestinal appliance and device: Secondary | ICD-10-CM | POA: Diagnosis not present

## 2020-11-05 DIAGNOSIS — D62 Acute posthemorrhagic anemia: Secondary | ICD-10-CM | POA: Diagnosis not present

## 2020-11-05 DIAGNOSIS — I341 Nonrheumatic mitral (valve) prolapse: Secondary | ICD-10-CM | POA: Diagnosis not present

## 2020-11-05 DIAGNOSIS — B9689 Other specified bacterial agents as the cause of diseases classified elsewhere: Secondary | ICD-10-CM | POA: Diagnosis not present

## 2020-11-06 DIAGNOSIS — D72829 Elevated white blood cell count, unspecified: Secondary | ICD-10-CM | POA: Diagnosis not present

## 2020-11-06 DIAGNOSIS — J9601 Acute respiratory failure with hypoxia: Secondary | ICD-10-CM | POA: Diagnosis not present

## 2020-11-06 DIAGNOSIS — R7881 Bacteremia: Secondary | ICD-10-CM | POA: Diagnosis not present

## 2020-11-06 DIAGNOSIS — I5082 Biventricular heart failure: Secondary | ICD-10-CM | POA: Diagnosis not present

## 2020-11-06 DIAGNOSIS — R509 Fever, unspecified: Secondary | ICD-10-CM | POA: Diagnosis not present

## 2020-11-06 DIAGNOSIS — I341 Nonrheumatic mitral (valve) prolapse: Secondary | ICD-10-CM | POA: Diagnosis not present

## 2020-11-06 DIAGNOSIS — D696 Thrombocytopenia, unspecified: Secondary | ICD-10-CM | POA: Diagnosis not present

## 2020-11-06 DIAGNOSIS — I4891 Unspecified atrial fibrillation: Secondary | ICD-10-CM | POA: Diagnosis not present

## 2020-11-06 DIAGNOSIS — L89156 Pressure-induced deep tissue damage of sacral region: Secondary | ICD-10-CM | POA: Diagnosis not present

## 2020-11-06 DIAGNOSIS — Q8740 Marfan's syndrome, unspecified: Secondary | ICD-10-CM | POA: Diagnosis not present

## 2020-11-06 DIAGNOSIS — B9689 Other specified bacterial agents as the cause of diseases classified elsewhere: Secondary | ICD-10-CM | POA: Diagnosis not present

## 2020-11-06 DIAGNOSIS — L8946 Pressure-induced deep tissue damage of contiguous site of back, buttock and hip: Secondary | ICD-10-CM | POA: Diagnosis not present

## 2020-11-06 DIAGNOSIS — I77810 Thoracic aortic ectasia: Secondary | ICD-10-CM | POA: Diagnosis not present

## 2020-11-06 DIAGNOSIS — D62 Acute posthemorrhagic anemia: Secondary | ICD-10-CM | POA: Diagnosis not present

## 2020-11-06 DIAGNOSIS — R0602 Shortness of breath: Secondary | ICD-10-CM | POA: Diagnosis not present

## 2020-11-07 DIAGNOSIS — Z4659 Encounter for fitting and adjustment of other gastrointestinal appliance and device: Secondary | ICD-10-CM | POA: Diagnosis not present

## 2020-11-07 DIAGNOSIS — I77810 Thoracic aortic ectasia: Secondary | ICD-10-CM | POA: Diagnosis not present

## 2020-11-07 DIAGNOSIS — I482 Chronic atrial fibrillation, unspecified: Secondary | ICD-10-CM | POA: Diagnosis not present

## 2020-11-07 DIAGNOSIS — Z6823 Body mass index (BMI) 23.0-23.9, adult: Secondary | ICD-10-CM | POA: Diagnosis not present

## 2020-11-07 DIAGNOSIS — J939 Pneumothorax, unspecified: Secondary | ICD-10-CM | POA: Diagnosis not present

## 2020-11-07 DIAGNOSIS — I10 Essential (primary) hypertension: Secondary | ICD-10-CM | POA: Diagnosis not present

## 2020-11-07 DIAGNOSIS — D62 Acute posthemorrhagic anemia: Secondary | ICD-10-CM | POA: Diagnosis not present

## 2020-11-07 DIAGNOSIS — E43 Unspecified severe protein-calorie malnutrition: Secondary | ICD-10-CM | POA: Diagnosis not present

## 2020-11-07 DIAGNOSIS — K8020 Calculus of gallbladder without cholecystitis without obstruction: Secondary | ICD-10-CM | POA: Diagnosis not present

## 2020-11-07 DIAGNOSIS — I471 Supraventricular tachycardia: Secondary | ICD-10-CM | POA: Diagnosis not present

## 2020-11-07 DIAGNOSIS — I341 Nonrheumatic mitral (valve) prolapse: Secondary | ICD-10-CM | POA: Diagnosis not present

## 2020-11-07 DIAGNOSIS — D696 Thrombocytopenia, unspecified: Secondary | ICD-10-CM | POA: Diagnosis not present

## 2020-11-07 DIAGNOSIS — Q8740 Marfan's syndrome, unspecified: Secondary | ICD-10-CM | POA: Diagnosis not present

## 2020-11-07 DIAGNOSIS — I5082 Biventricular heart failure: Secondary | ICD-10-CM | POA: Diagnosis not present

## 2020-11-07 DIAGNOSIS — E872 Acidosis: Secondary | ICD-10-CM | POA: Diagnosis not present

## 2020-11-08 DIAGNOSIS — Z6823 Body mass index (BMI) 23.0-23.9, adult: Secondary | ICD-10-CM | POA: Diagnosis not present

## 2020-11-08 DIAGNOSIS — I5082 Biventricular heart failure: Secondary | ICD-10-CM | POA: Diagnosis not present

## 2020-11-08 DIAGNOSIS — I10 Essential (primary) hypertension: Secondary | ICD-10-CM | POA: Diagnosis not present

## 2020-11-08 DIAGNOSIS — R509 Fever, unspecified: Secondary | ICD-10-CM | POA: Diagnosis not present

## 2020-11-08 DIAGNOSIS — I482 Chronic atrial fibrillation, unspecified: Secondary | ICD-10-CM | POA: Diagnosis not present

## 2020-11-08 DIAGNOSIS — I341 Nonrheumatic mitral (valve) prolapse: Secondary | ICD-10-CM | POA: Diagnosis not present

## 2020-11-08 DIAGNOSIS — D72829 Elevated white blood cell count, unspecified: Secondary | ICD-10-CM | POA: Diagnosis not present

## 2020-11-08 DIAGNOSIS — B9689 Other specified bacterial agents as the cause of diseases classified elsewhere: Secondary | ICD-10-CM | POA: Diagnosis not present

## 2020-11-08 DIAGNOSIS — R0602 Shortness of breath: Secondary | ICD-10-CM | POA: Diagnosis not present

## 2020-11-08 DIAGNOSIS — I471 Supraventricular tachycardia: Secondary | ICD-10-CM | POA: Diagnosis not present

## 2020-11-08 DIAGNOSIS — R7881 Bacteremia: Secondary | ICD-10-CM | POA: Diagnosis not present

## 2020-11-08 DIAGNOSIS — R918 Other nonspecific abnormal finding of lung field: Secondary | ICD-10-CM | POA: Diagnosis not present

## 2020-11-08 DIAGNOSIS — E43 Unspecified severe protein-calorie malnutrition: Secondary | ICD-10-CM | POA: Diagnosis not present

## 2020-11-08 DIAGNOSIS — D696 Thrombocytopenia, unspecified: Secondary | ICD-10-CM | POA: Diagnosis not present

## 2020-11-08 DIAGNOSIS — Q8740 Marfan's syndrome, unspecified: Secondary | ICD-10-CM | POA: Diagnosis not present

## 2020-11-08 DIAGNOSIS — D62 Acute posthemorrhagic anemia: Secondary | ICD-10-CM | POA: Diagnosis not present

## 2020-11-10 DIAGNOSIS — R7881 Bacteremia: Secondary | ICD-10-CM | POA: Diagnosis not present

## 2020-11-10 DIAGNOSIS — I341 Nonrheumatic mitral (valve) prolapse: Secondary | ICD-10-CM | POA: Diagnosis not present

## 2020-11-10 DIAGNOSIS — R197 Diarrhea, unspecified: Secondary | ICD-10-CM | POA: Diagnosis not present

## 2020-11-10 DIAGNOSIS — B9689 Other specified bacterial agents as the cause of diseases classified elsewhere: Secondary | ICD-10-CM | POA: Diagnosis not present

## 2020-11-10 DIAGNOSIS — D72829 Elevated white blood cell count, unspecified: Secondary | ICD-10-CM | POA: Diagnosis not present

## 2020-11-11 DIAGNOSIS — I341 Nonrheumatic mitral (valve) prolapse: Secondary | ICD-10-CM | POA: Diagnosis not present

## 2020-11-11 DIAGNOSIS — I10 Essential (primary) hypertension: Secondary | ICD-10-CM | POA: Diagnosis not present

## 2020-11-11 DIAGNOSIS — Q8740 Marfan's syndrome, unspecified: Secondary | ICD-10-CM | POA: Diagnosis not present

## 2020-11-11 DIAGNOSIS — J939 Pneumothorax, unspecified: Secondary | ICD-10-CM | POA: Diagnosis not present

## 2020-11-11 DIAGNOSIS — Z6823 Body mass index (BMI) 23.0-23.9, adult: Secondary | ICD-10-CM | POA: Diagnosis not present

## 2020-11-11 DIAGNOSIS — R0602 Shortness of breath: Secondary | ICD-10-CM | POA: Diagnosis not present

## 2020-11-11 DIAGNOSIS — Z9981 Dependence on supplemental oxygen: Secondary | ICD-10-CM | POA: Diagnosis not present

## 2020-11-11 DIAGNOSIS — E43 Unspecified severe protein-calorie malnutrition: Secondary | ICD-10-CM | POA: Diagnosis not present

## 2020-11-11 DIAGNOSIS — D62 Acute posthemorrhagic anemia: Secondary | ICD-10-CM | POA: Diagnosis not present

## 2020-11-12 DIAGNOSIS — I341 Nonrheumatic mitral (valve) prolapse: Secondary | ICD-10-CM | POA: Diagnosis not present

## 2020-11-12 DIAGNOSIS — R0602 Shortness of breath: Secondary | ICD-10-CM | POA: Diagnosis not present

## 2020-11-12 DIAGNOSIS — E872 Acidosis: Secondary | ICD-10-CM | POA: Diagnosis not present

## 2020-11-12 DIAGNOSIS — E43 Unspecified severe protein-calorie malnutrition: Secondary | ICD-10-CM | POA: Diagnosis not present

## 2020-11-12 DIAGNOSIS — Z4682 Encounter for fitting and adjustment of non-vascular catheter: Secondary | ICD-10-CM | POA: Diagnosis not present

## 2020-11-12 DIAGNOSIS — J9602 Acute respiratory failure with hypercapnia: Secondary | ICD-10-CM | POA: Diagnosis not present

## 2020-11-12 DIAGNOSIS — J939 Pneumothorax, unspecified: Secondary | ICD-10-CM | POA: Diagnosis not present

## 2020-11-12 DIAGNOSIS — J948 Other specified pleural conditions: Secondary | ICD-10-CM | POA: Diagnosis not present

## 2020-11-12 DIAGNOSIS — R1310 Dysphagia, unspecified: Secondary | ICD-10-CM | POA: Diagnosis not present

## 2020-11-13 DIAGNOSIS — D72829 Elevated white blood cell count, unspecified: Secondary | ICD-10-CM | POA: Diagnosis not present

## 2020-11-13 DIAGNOSIS — I341 Nonrheumatic mitral (valve) prolapse: Secondary | ICD-10-CM | POA: Diagnosis not present

## 2020-11-13 DIAGNOSIS — R509 Fever, unspecified: Secondary | ICD-10-CM | POA: Diagnosis not present

## 2020-11-13 DIAGNOSIS — E872 Acidosis: Secondary | ICD-10-CM | POA: Diagnosis not present

## 2020-11-13 DIAGNOSIS — E43 Unspecified severe protein-calorie malnutrition: Secondary | ICD-10-CM | POA: Diagnosis not present

## 2020-11-13 DIAGNOSIS — B9689 Other specified bacterial agents as the cause of diseases classified elsewhere: Secondary | ICD-10-CM | POA: Diagnosis not present

## 2020-11-13 DIAGNOSIS — R7881 Bacteremia: Secondary | ICD-10-CM | POA: Diagnosis not present

## 2020-11-13 DIAGNOSIS — R1310 Dysphagia, unspecified: Secondary | ICD-10-CM | POA: Diagnosis not present

## 2020-11-13 DIAGNOSIS — J9602 Acute respiratory failure with hypercapnia: Secondary | ICD-10-CM | POA: Diagnosis not present

## 2020-11-13 DIAGNOSIS — J939 Pneumothorax, unspecified: Secondary | ICD-10-CM | POA: Diagnosis not present

## 2020-11-14 DIAGNOSIS — J9602 Acute respiratory failure with hypercapnia: Secondary | ICD-10-CM | POA: Diagnosis not present

## 2020-11-14 DIAGNOSIS — J939 Pneumothorax, unspecified: Secondary | ICD-10-CM | POA: Diagnosis not present

## 2020-11-14 DIAGNOSIS — E43 Unspecified severe protein-calorie malnutrition: Secondary | ICD-10-CM | POA: Diagnosis not present

## 2020-11-14 DIAGNOSIS — Z452 Encounter for adjustment and management of vascular access device: Secondary | ICD-10-CM | POA: Diagnosis not present

## 2020-11-14 DIAGNOSIS — E872 Acidosis: Secondary | ICD-10-CM | POA: Diagnosis not present

## 2020-11-14 DIAGNOSIS — R1310 Dysphagia, unspecified: Secondary | ICD-10-CM | POA: Diagnosis not present

## 2020-11-15 DIAGNOSIS — I341 Nonrheumatic mitral (valve) prolapse: Secondary | ICD-10-CM | POA: Diagnosis not present

## 2020-11-15 DIAGNOSIS — B9689 Other specified bacterial agents as the cause of diseases classified elsewhere: Secondary | ICD-10-CM | POA: Diagnosis not present

## 2020-11-15 DIAGNOSIS — R7881 Bacteremia: Secondary | ICD-10-CM | POA: Diagnosis not present

## 2020-11-16 DIAGNOSIS — E43 Unspecified severe protein-calorie malnutrition: Secondary | ICD-10-CM | POA: Diagnosis not present

## 2020-11-16 DIAGNOSIS — L89156 Pressure-induced deep tissue damage of sacral region: Secondary | ICD-10-CM | POA: Diagnosis not present

## 2020-11-16 DIAGNOSIS — R0689 Other abnormalities of breathing: Secondary | ICD-10-CM | POA: Diagnosis not present

## 2020-11-16 DIAGNOSIS — D696 Thrombocytopenia, unspecified: Secondary | ICD-10-CM | POA: Diagnosis not present

## 2020-11-16 DIAGNOSIS — L89106 Pressure-induced deep tissue damage of unspecified part of back: Secondary | ICD-10-CM | POA: Diagnosis not present

## 2020-11-16 DIAGNOSIS — I341 Nonrheumatic mitral (valve) prolapse: Secondary | ICD-10-CM | POA: Diagnosis not present

## 2020-11-16 DIAGNOSIS — J939 Pneumothorax, unspecified: Secondary | ICD-10-CM | POA: Diagnosis not present

## 2020-11-16 DIAGNOSIS — J948 Other specified pleural conditions: Secondary | ICD-10-CM | POA: Diagnosis not present

## 2020-11-17 DIAGNOSIS — R0689 Other abnormalities of breathing: Secondary | ICD-10-CM | POA: Diagnosis not present

## 2020-11-17 DIAGNOSIS — I341 Nonrheumatic mitral (valve) prolapse: Secondary | ICD-10-CM | POA: Diagnosis not present

## 2020-11-17 DIAGNOSIS — E43 Unspecified severe protein-calorie malnutrition: Secondary | ICD-10-CM | POA: Diagnosis not present

## 2020-11-17 DIAGNOSIS — L89156 Pressure-induced deep tissue damage of sacral region: Secondary | ICD-10-CM | POA: Diagnosis not present

## 2020-11-17 DIAGNOSIS — L89106 Pressure-induced deep tissue damage of unspecified part of back: Secondary | ICD-10-CM | POA: Diagnosis not present

## 2020-11-17 DIAGNOSIS — J948 Other specified pleural conditions: Secondary | ICD-10-CM | POA: Diagnosis not present

## 2020-11-17 DIAGNOSIS — D696 Thrombocytopenia, unspecified: Secondary | ICD-10-CM | POA: Diagnosis not present

## 2020-11-17 DIAGNOSIS — Z4659 Encounter for fitting and adjustment of other gastrointestinal appliance and device: Secondary | ICD-10-CM | POA: Diagnosis not present

## 2020-11-18 DIAGNOSIS — I059 Rheumatic mitral valve disease, unspecified: Secondary | ICD-10-CM | POA: Diagnosis not present

## 2020-11-18 DIAGNOSIS — J9600 Acute respiratory failure, unspecified whether with hypoxia or hypercapnia: Secondary | ICD-10-CM | POA: Diagnosis not present

## 2020-11-18 DIAGNOSIS — D7582 Heparin induced thrombocytopenia (HIT): Secondary | ICD-10-CM | POA: Diagnosis not present

## 2020-11-18 DIAGNOSIS — I77810 Thoracic aortic ectasia: Secondary | ICD-10-CM | POA: Diagnosis not present

## 2020-11-18 DIAGNOSIS — E43 Unspecified severe protein-calorie malnutrition: Secondary | ICD-10-CM | POA: Diagnosis not present

## 2020-11-18 DIAGNOSIS — J9601 Acute respiratory failure with hypoxia: Secondary | ICD-10-CM | POA: Diagnosis not present

## 2020-11-19 DIAGNOSIS — B954 Other streptococcus as the cause of diseases classified elsewhere: Secondary | ICD-10-CM | POA: Diagnosis not present

## 2020-11-19 DIAGNOSIS — D7582 Heparin induced thrombocytopenia (HIT): Secondary | ICD-10-CM | POA: Diagnosis not present

## 2020-11-19 DIAGNOSIS — J9600 Acute respiratory failure, unspecified whether with hypoxia or hypercapnia: Secondary | ICD-10-CM | POA: Diagnosis not present

## 2020-11-19 DIAGNOSIS — I059 Rheumatic mitral valve disease, unspecified: Secondary | ICD-10-CM | POA: Diagnosis not present

## 2020-11-19 DIAGNOSIS — E43 Unspecified severe protein-calorie malnutrition: Secondary | ICD-10-CM | POA: Diagnosis not present

## 2020-11-19 DIAGNOSIS — I341 Nonrheumatic mitral (valve) prolapse: Secondary | ICD-10-CM | POA: Diagnosis not present

## 2020-11-19 DIAGNOSIS — R7881 Bacteremia: Secondary | ICD-10-CM | POA: Diagnosis not present

## 2020-11-19 DIAGNOSIS — J948 Other specified pleural conditions: Secondary | ICD-10-CM | POA: Diagnosis not present

## 2020-11-19 DIAGNOSIS — I4510 Unspecified right bundle-branch block: Secondary | ICD-10-CM | POA: Diagnosis not present

## 2020-11-19 DIAGNOSIS — B9689 Other specified bacterial agents as the cause of diseases classified elsewhere: Secondary | ICD-10-CM | POA: Diagnosis not present

## 2020-11-19 DIAGNOSIS — I440 Atrioventricular block, first degree: Secondary | ICD-10-CM | POA: Diagnosis not present

## 2020-11-19 DIAGNOSIS — J9601 Acute respiratory failure with hypoxia: Secondary | ICD-10-CM | POA: Diagnosis not present

## 2020-11-20 DIAGNOSIS — E43 Unspecified severe protein-calorie malnutrition: Secondary | ICD-10-CM | POA: Diagnosis not present

## 2020-11-20 DIAGNOSIS — J9600 Acute respiratory failure, unspecified whether with hypoxia or hypercapnia: Secondary | ICD-10-CM | POA: Diagnosis not present

## 2020-11-20 DIAGNOSIS — R140 Abdominal distension (gaseous): Secondary | ICD-10-CM | POA: Diagnosis not present

## 2020-11-20 DIAGNOSIS — D7582 Heparin induced thrombocytopenia (HIT): Secondary | ICD-10-CM | POA: Diagnosis not present

## 2020-11-20 DIAGNOSIS — I059 Rheumatic mitral valve disease, unspecified: Secondary | ICD-10-CM | POA: Diagnosis not present

## 2020-11-20 DIAGNOSIS — J95811 Postprocedural pneumothorax: Secondary | ICD-10-CM | POA: Diagnosis not present

## 2020-11-21 DIAGNOSIS — J9601 Acute respiratory failure with hypoxia: Secondary | ICD-10-CM | POA: Diagnosis not present

## 2020-11-21 DIAGNOSIS — I10 Essential (primary) hypertension: Secondary | ICD-10-CM | POA: Diagnosis not present

## 2020-11-21 DIAGNOSIS — I059 Rheumatic mitral valve disease, unspecified: Secondary | ICD-10-CM | POA: Diagnosis not present

## 2020-11-21 DIAGNOSIS — I4891 Unspecified atrial fibrillation: Secondary | ICD-10-CM | POA: Diagnosis not present

## 2020-11-21 DIAGNOSIS — E43 Unspecified severe protein-calorie malnutrition: Secondary | ICD-10-CM | POA: Diagnosis not present

## 2020-11-25 ENCOUNTER — Inpatient Hospital Stay
Admission: RE | Admit: 2020-11-25 | Discharge: 2020-12-23 | Disposition: A | Discharge status: Still In Hospital | Payer: 59 | Attending: Internal Medicine | Admitting: Internal Medicine

## 2020-11-25 ENCOUNTER — Other Ambulatory Visit (HOSPITAL_COMMUNITY): Payer: 59

## 2020-11-25 DIAGNOSIS — I083 Combined rheumatic disorders of mitral, aortic and tricuspid valves: Secondary | ICD-10-CM | POA: Diagnosis not present

## 2020-11-25 DIAGNOSIS — Z48812 Encounter for surgical aftercare following surgery on the circulatory system: Secondary | ICD-10-CM | POA: Diagnosis not present

## 2020-11-25 DIAGNOSIS — I38 Endocarditis, valve unspecified: Secondary | ICD-10-CM | POA: Diagnosis not present

## 2020-11-25 DIAGNOSIS — E46 Unspecified protein-calorie malnutrition: Secondary | ICD-10-CM | POA: Diagnosis not present

## 2020-11-25 DIAGNOSIS — Q211 Atrial septal defect: Secondary | ICD-10-CM | POA: Diagnosis not present

## 2020-11-25 DIAGNOSIS — I330 Acute and subacute infective endocarditis: Secondary | ICD-10-CM | POA: Diagnosis not present

## 2020-11-25 DIAGNOSIS — Z9689 Presence of other specified functional implants: Secondary | ICD-10-CM

## 2020-11-25 DIAGNOSIS — L89152 Pressure ulcer of sacral region, stage 2: Secondary | ICD-10-CM | POA: Diagnosis not present

## 2020-11-25 DIAGNOSIS — I517 Cardiomegaly: Secondary | ICD-10-CM | POA: Diagnosis not present

## 2020-11-25 DIAGNOSIS — I482 Chronic atrial fibrillation, unspecified: Secondary | ICD-10-CM | POA: Diagnosis not present

## 2020-11-25 DIAGNOSIS — Z952 Presence of prosthetic heart valve: Secondary | ICD-10-CM | POA: Diagnosis not present

## 2020-11-25 DIAGNOSIS — Q8743 Marfan's syndrome with skeletal manifestation: Secondary | ICD-10-CM | POA: Diagnosis not present

## 2020-11-25 DIAGNOSIS — J90 Pleural effusion, not elsewhere classified: Secondary | ICD-10-CM | POA: Diagnosis not present

## 2020-11-25 DIAGNOSIS — Q8740 Marfan's syndrome, unspecified: Secondary | ICD-10-CM | POA: Diagnosis not present

## 2020-11-25 DIAGNOSIS — D649 Anemia, unspecified: Secondary | ICD-10-CM | POA: Diagnosis not present

## 2020-11-25 DIAGNOSIS — Z4682 Encounter for fitting and adjustment of non-vascular catheter: Secondary | ICD-10-CM | POA: Diagnosis not present

## 2020-11-25 DIAGNOSIS — Z9889 Other specified postprocedural states: Secondary | ICD-10-CM

## 2020-11-25 DIAGNOSIS — J189 Pneumonia, unspecified organism: Secondary | ICD-10-CM

## 2020-11-25 DIAGNOSIS — J9601 Acute respiratory failure with hypoxia: Secondary | ICD-10-CM | POA: Diagnosis not present

## 2020-11-25 DIAGNOSIS — T85598A Other mechanical complication of other gastrointestinal prosthetic devices, implants and grafts, initial encounter: Secondary | ICD-10-CM

## 2020-11-25 DIAGNOSIS — J939 Pneumothorax, unspecified: Secondary | ICD-10-CM | POA: Diagnosis not present

## 2020-11-25 DIAGNOSIS — B952 Enterococcus as the cause of diseases classified elsewhere: Secondary | ICD-10-CM | POA: Diagnosis not present

## 2020-11-25 DIAGNOSIS — I480 Paroxysmal atrial fibrillation: Secondary | ICD-10-CM | POA: Diagnosis not present

## 2020-11-25 DIAGNOSIS — J9600 Acute respiratory failure, unspecified whether with hypoxia or hypercapnia: Secondary | ICD-10-CM | POA: Diagnosis not present

## 2020-11-25 DIAGNOSIS — J9811 Atelectasis: Secondary | ICD-10-CM | POA: Diagnosis not present

## 2020-11-25 DIAGNOSIS — N390 Urinary tract infection, site not specified: Secondary | ICD-10-CM | POA: Diagnosis not present

## 2020-11-25 DIAGNOSIS — R0602 Shortness of breath: Secondary | ICD-10-CM | POA: Diagnosis not present

## 2020-11-25 DIAGNOSIS — I483 Typical atrial flutter: Secondary | ICD-10-CM | POA: Diagnosis not present

## 2020-11-25 LAB — PROTIME-INR
INR: 3.3 — ABNORMAL HIGH (ref 0.8–1.2)
Prothrombin Time: 33.8 seconds — ABNORMAL HIGH (ref 11.4–15.2)

## 2020-11-25 LAB — APTT: aPTT: 137 seconds — ABNORMAL HIGH (ref 24–36)

## 2020-11-26 ENCOUNTER — Other Ambulatory Visit (HOSPITAL_COMMUNITY): Payer: 59

## 2020-11-26 ENCOUNTER — Other Ambulatory Visit: Payer: Self-pay | Admitting: *Deleted

## 2020-11-26 LAB — CBC WITH DIFFERENTIAL/PLATELET
Abs Immature Granulocytes: 0.05 10*3/uL (ref 0.00–0.07)
Basophils Absolute: 0.1 10*3/uL (ref 0.0–0.1)
Basophils Relative: 1 %
Eosinophils Absolute: 0.1 10*3/uL (ref 0.0–0.5)
Eosinophils Relative: 1 %
HCT: 25.8 % — ABNORMAL LOW (ref 36.0–46.0)
Hemoglobin: 8 g/dL — ABNORMAL LOW (ref 12.0–15.0)
Immature Granulocytes: 1 %
Lymphocytes Relative: 11 %
Lymphs Abs: 0.9 10*3/uL (ref 0.7–4.0)
MCH: 30 pg (ref 26.0–34.0)
MCHC: 31 g/dL (ref 30.0–36.0)
MCV: 96.6 fL (ref 80.0–100.0)
Monocytes Absolute: 0.4 10*3/uL (ref 0.1–1.0)
Monocytes Relative: 5 %
Neutro Abs: 6.6 10*3/uL (ref 1.7–7.7)
Neutrophils Relative %: 81 %
Platelets: 266 10*3/uL (ref 150–400)
RBC: 2.67 MIL/uL — ABNORMAL LOW (ref 3.87–5.11)
RDW: 20.8 % — ABNORMAL HIGH (ref 11.5–15.5)
WBC: 8.1 10*3/uL (ref 4.0–10.5)
nRBC: 0 % (ref 0.0–0.2)

## 2020-11-26 LAB — CBC
HCT: 24.8 % — ABNORMAL LOW (ref 36.0–46.0)
Hemoglobin: 8.1 g/dL — ABNORMAL LOW (ref 12.0–15.0)
MCH: 31.2 pg (ref 26.0–34.0)
MCHC: 32.7 g/dL (ref 30.0–36.0)
MCV: 95.4 fL (ref 80.0–100.0)
Platelets: 300 10*3/uL (ref 150–400)
RBC: 2.6 MIL/uL — ABNORMAL LOW (ref 3.87–5.11)
RDW: 20.7 % — ABNORMAL HIGH (ref 11.5–15.5)
WBC: 9 10*3/uL (ref 4.0–10.5)
nRBC: 0 % (ref 0.0–0.2)

## 2020-11-26 LAB — URINALYSIS, ROUTINE W REFLEX MICROSCOPIC
Bilirubin Urine: NEGATIVE
Glucose, UA: NEGATIVE mg/dL
Ketones, ur: NEGATIVE mg/dL
Nitrite: NEGATIVE
Protein, ur: NEGATIVE mg/dL
RBC / HPF: >50 RBC/hpf — ABNORMAL HIGH (ref 0–5)
Specific Gravity, Urine: 1.008 (ref 1.005–1.030)
WBC, UA: >50 WBC/hpf — ABNORMAL HIGH (ref 0–5)
pH: 8 (ref 5.0–8.0)

## 2020-11-26 LAB — BASIC METABOLIC PANEL
Anion gap: 7 (ref 5–15)
BUN: 14 mg/dL (ref 6–20)
CO2: 36 mmol/L — ABNORMAL HIGH (ref 22–32)
Calcium: 8.4 mg/dL — ABNORMAL LOW (ref 8.9–10.3)
Chloride: 91 mmol/L — ABNORMAL LOW (ref 98–111)
Creatinine, Ser: 0.47 mg/dL (ref 0.44–1.00)
GFR, Estimated: >60 mL/min (ref >60)
Glucose, Bld: 126 mg/dL — ABNORMAL HIGH (ref 70–99)
Potassium: 3.1 mmol/L — ABNORMAL LOW (ref 3.5–5.1)
Sodium: 134 mmol/L — ABNORMAL LOW (ref 135–145)

## 2020-11-26 LAB — APTT
aPTT: 82 seconds — ABNORMAL HIGH (ref 24–36)
aPTT: 67 seconds — ABNORMAL HIGH (ref 24–36)
aPTT: 84 seconds — ABNORMAL HIGH (ref 24–36)
aPTT: 77 seconds — ABNORMAL HIGH (ref 24–36)
aPTT: 40 seconds — ABNORMAL HIGH (ref 24–36)

## 2020-11-26 LAB — PROTIME-INR
INR: 1.5 — ABNORMAL HIGH (ref 0.8–1.2)
Prothrombin Time: 18.2 seconds — ABNORMAL HIGH (ref 11.4–15.2)

## 2020-11-26 LAB — SARS CORONAVIRUS 2 (TAT 6-24 HRS): SARS Coronavirus 2: NEGATIVE

## 2020-11-26 NOTE — Consult Note (Signed)
Referring Physician: A. Laren Everts, MD   Jennifer [name] is an 45 y.o. female.                       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 Her cardiac catheterization showed normal coronaries. Post surgery she had intubation x 2, cardiogenic shock and had Impella support. She also has right sided pneumothorax with chest tube.  She has atrial fibrillation with moderate ventricular response.  Past Medical History:  Diagnosis Date  . Marfan syndrome       Past Surgical History:  Procedure Laterality Date  . HERNIA REPAIR      Family History  Problem Relation Age of Onset  . Marfan syndrome Father   . Hepatitis C Father    Social History:  reports that she has never smoked. She has never used smokeless tobacco. She reports current alcohol use. She reports that she does not use drugs.  Allergies: No Known Allergies  Medications Prior to Admission  Medication Sig Dispense Refill  . atenolol (TENORMIN) 25 MG tablet Take 1 tablet by mouth daily.    Marland Kitchen atenolol (TENORMIN) 25 MG tablet TAKE 1 AND 1/2 TABLETS BY MOUTH DAILY 135 tablet 2  . doxycycline (VIBRA-TABS) 100 MG tablet Take 100 mg by mouth 2 (two) times daily.    Marland Kitchen losartan (COZAAR) 50 MG tablet Take 50 mg by mouth 2 (two) times daily.    Marland Kitchen losartan (COZAAR) 50 MG tablet TAKE 2 TABLETS BY MOUTH ONCE A DAY 60 tablet 0  . losartan (COZAAR) 50 MG tablet TAKE 2 TABLETS (100 MG) BY MOUTH DAILY 480 tablet 0    Results for orders placed or performed during the hospital encounter of 11/25/20 (from the past 48 hour(s))  Protime-INR     Status: Abnormal   Collection Time: 11/25/20  8:52 PM  Result Value Ref Range   Prothrombin Time 33.8 (H) 11.4 - 15.2 seconds   INR 3.3 (H) 0.8 - 1.2    Comment: (NOTE) INR goal varies based on device and disease states. Performed at  [facility], [place] [address] [place], [place] [address]   APTT     Status: Abnormal   Collection Time: 11/25/20  8:52 PM  Result Value Ref Range   aPTT 137 (H) 24 - 36 seconds    Comment:        IF BASELINE aPTT IS ELEVATED, SUGGEST PATIENT RISK ASSESSMENT BE USED TO DETERMINE APPROPRIATE ANTICOAGULANT THERAPY. Performed at [facility], [place] [address] [place], [place] [address]   CBC with Differential/Platelet     Status: Abnormal   Collection Time: 11/26/20  4:25 AM  Result Value Ref Range   WBC 8.1 4.0 - 10.5 K/uL   RBC 2.67 (L) 3.87 - 5.11 MIL/uL   Hemoglobin 8.0 (L) 12.0 - 15.0 g/dL   HCT 25.8 (L) 36.0 - 46.0 %   MCV 96.6 80.0 - 100.0 fL   MCH 30.0 26.0 - 34.0 pg   MCHC 31.0 30.0 - 36.0 g/dL   RDW 20.8 (H) 11.5 - 15.5 %   Platelets 266 150 - 400 K/uL   nRBC 0.0 0.0 - 0.2 %   Neutrophils Relative % 81 %   Neutro Abs 6.6 1.7 - 7.7 K/uL   Lymphocytes Relative 11 %   Lymphs  Abs 0.9 0.7 - 4.0 K/uL   Monocytes Relative 5 %   Monocytes Absolute 0.4 0.1 - 1.0 K/uL   Eosinophils Relative 1 %   Eosinophils Absolute 0.1 0.0 - 0.5 K/uL   Basophils Relative 1 %   Basophils Absolute 0.1 0.0 - 0.1 K/uL   Immature Granulocytes 1 %   Abs Immature Granulocytes 0.05 0.00 - 0.07 K/uL    Comment: Performed at [facility], [place] [address], [place], [place] [address]  APTT     Status: Abnormal   Collection Time: 11/26/20  4:25 AM  Result Value Ref Range   aPTT 82 (H) 24 - 36 seconds    Comment:        IF BASELINE aPTT IS ELEVATED, SUGGEST PATIENT RISK ASSESSMENT BE USED TO DETERMINE APPROPRIATE ANTICOAGULANT THERAPY. Performed at [facility], [place] [address], [place], [place] [address]   Basic metabolic panel     Status: Abnormal   Collection Time: 11/26/20  4:25 AM  Result Value Ref Range   Sodium 134 (L) 135 - 145 mmol/L   Potassium 3.1 (L) 3.5 - 5.1 mmol/L   Chloride 91 (L) 98 - 111 mmol/L   CO2 36 (H) 22 - 32 mmol/L   Glucose, Bld 126 (H) 70 - 99  mg/dL    Comment: Glucose reference range applies only to samples taken after fasting for at least 8 hours.   BUN 14 6 - 20 mg/dL   Creatinine, Ser 0.47 0.44 - 1.00 mg/dL   Calcium 8.4 (L) 8.9 - 10.3 mg/dL   GFR, Estimated >60 >60 mL/min    Comment: (NOTE) Calculated using the CKD-EPI Creatinine Equation (2021)    Anion gap 7 5 - 15    Comment: Performed at [facility] [address] [place], [place] [address]  APTT     Status: Abnormal   Collection Time: 11/26/20  8:38 AM  Result Value Ref Range   aPTT 67 (H) 24 - 36 seconds    Comment:        IF BASELINE aPTT IS ELEVATED, SUGGEST PATIENT RISK ASSESSMENT BE USED TO DETERMINE APPROPRIATE ANTICOAGULANT THERAPY. Performed at [facility], [place] [address], [place], [place] [address]   Protime-INR     Status: Abnormal   Collection Time: 11/26/20  8:38 AM  Result Value Ref Range   Prothrombin Time 18.2 (H) 11.4 - 15.2 seconds   INR 1.5 (H) 0.8 - 1.2    Comment: (NOTE) INR goal varies based on device and disease states. Performed at [facility], [place] [address], [place],  [address]    DG CHEST PORT 1 VIEW  Result Date: 11/26/2020 CLINICAL DATA:  Shortness of breath.  Heart surgery.  Chest tube. EXAM: PORTABLE CHEST 1 VIEW COMPARISON:  CT report 07/10/2020. FINDINGS: Feeding tube noted with tip below left hemidiaphragm. Right PICC line noted with tip over SVC. Right chest tube noted in good anatomic position. Dense left lower lobe atelectasis and consolidation. Bilateral interstitial prominence. Large left pleural effusion. Small right pleural effusion. No pneumothorax. Post cardiac surgery. Cardiomegaly. Postsurgical changes noted of the chest and thoracic spine. IMPRESSION: 1. Lines and tubes including right chest tube in good anatomic position. No pneumothorax. 2.  Dense left lower lobe atelectasis and consolidation. 3. Bilateral interstitial prominence suggesting interstitial edema and or pneumonitis. Large  left pleural effusion. 4.  Post cardiac surgery.  Cardiomegaly. Electronically Signed   By: Marcello Moores  Register  On: 11/26/2020 08:35   DG Abd Portable 1V  Result Date: 11/26/2020 CLINICAL DATA:  Impaired nasogastric feeding tube. Initial encounter. EXAM: PORTABLE ABDOMEN - 1 VIEW COMPARISON:  None. FINDINGS: Weighted enteric tube in place, tip below the diaphragm in the midline in the region of the pylorus. No kink along the course of the tubing. No bowel dilatation to suggest obstruction. There is opacification of the included lower left hemithorax. Prosthetic cardiac valve is seen. IMPRESSION: Weighted enteric tube with tip in the midline in the region of the pylorus. Electronically Signed   By: Keith Rake M.D.   On: 11/26/2020 00:01    Review Of Systems Constitutional: Positive fever, no chills, chronic weight loss. Eyes: No vision change, wears glasses. No discharge or pain. Ears: No hearing loss, No tinnitus. Respiratory: No asthma, COPD, Positive pneumonias. Positive shortness of breath. No hemoptysis. Cardiovascular: No chest pain, positive palpitation, leg edema. Gastrointestinal: No nausea, vomiting, diarrhea, constipation. No GI bleed. No hepatitis. Genitourinary: No dysuria, hematuria, kidney stone. No incontinance. Neurological: No headache, stroke, seizures.  Psychiatry: No psych facility admission for anxiety, depression, suicide. No detox. Skin: No rash. Musculoskeletal: No joint pain, fibromyalgia. Positive neck pain, back pain. Lymphadenopathy: No lymphadenopathy. Hematology: Positive anemia or easy bruising.  P: 115, R: 24, BP : 100/60, O2 sat 95 % on 2 L oxygen by [place]. There were no vitals taken for this visit. There is no height or weight on file to calculate BMI. General appearance: alert, cooperative, appears stated age and mild respiratory distress Head: Normocephalic, atraumatic. Eyes: Blue eyes, pink conjunctiva, corneas clear.  Neck: No adenopathy, no carotid  bruit, no JVD, supple, symmetrical, trachea midline and thyroid not enlarged. Resp: Clearing to auscultation bilaterally. Cardio: Irregular rate and rhythm, S1, S2 normal, II/VI systolic murmur, no click, rub or gallop GI: Soft, non-tender; bowel sounds normal; no organomegaly. Extremities: No edema, cyanosis or clubbing. S/P sacral ulcer. Skin: Warm and dry.  Neurologic: Alert and oriented X 3, normal strength.  Assessment/Plan Atrial fibrillation with moderate ventricular response. S/P St. 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 Possible pneumonia Acute on chronic respiratory failure with hypoxia Blood loss anemia Marfan syndrome Thoracic aortic aneurysm  Plan:  Continue amiodarone, warfarin. Decrease Hydralazine dose. Add digoxin or metoprolol as needed for heart rate control. May need left thoracentesis.  Time spent: Review of old records, Lab, x-rays, EKG, other cardiac tests, examination, discussion with patient over 70 minutes.  Birdie Riddle, MD  11/26/2020, 11:01 AM

## 2020-11-26 NOTE — Patient Outreach (Signed)
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   Noted patient transferred to [facility] on 11/25/20.   Plan Will alert [facility] Liaison of patient transfer/ admission to [facility],to discuss . Will close and await discharge disposition plans for re referral.     Joylene Draft, RN, BSN  [place] Management Coordinator  [phone]- Mobile [phone]- [place]

## 2020-11-27 ENCOUNTER — Other Ambulatory Visit (HOSPITAL_COMMUNITY): Payer: 59

## 2020-11-27 HISTORY — PX: IR THORACENTESIS ASP PLEURAL SPACE W/IMG GUIDE: IMG5380

## 2020-11-27 LAB — COMPREHENSIVE METABOLIC PANEL
ALT: 29 U/L (ref 0–44)
AST: 35 U/L (ref 15–41)
Albumin: 1.9 g/dL — ABNORMAL LOW (ref 3.5–5.0)
Alkaline Phosphatase: 149 U/L — ABNORMAL HIGH (ref 38–126)
Anion gap: 10 (ref 5–15)
BUN: 20 mg/dL (ref 6–20)
CO2: 36 mmol/L — ABNORMAL HIGH (ref 22–32)
Calcium: 8.4 mg/dL — ABNORMAL LOW (ref 8.9–10.3)
Chloride: 87 mmol/L — ABNORMAL LOW (ref 98–111)
Creatinine, Ser: 0.5 mg/dL (ref 0.44–1.00)
GFR, Estimated: >60 mL/min (ref >60)
Glucose, Bld: 106 mg/dL — ABNORMAL HIGH (ref 70–99)
Potassium: 3.4 mmol/L — ABNORMAL LOW (ref 3.5–5.1)
Sodium: 133 mmol/L — ABNORMAL LOW (ref 135–145)
Total Bilirubin: 0.7 mg/dL (ref 0.3–1.2)
Total Protein: 5.5 g/dL — ABNORMAL LOW (ref 6.5–8.1)

## 2020-11-27 LAB — MAGNESIUM: Magnesium: 1.6 mg/dL — ABNORMAL LOW (ref 1.7–2.4)

## 2020-11-27 LAB — PROTIME-INR
INR: 2 — ABNORMAL HIGH (ref 0.8–1.2)
Prothrombin Time: 22.5 seconds — ABNORMAL HIGH (ref 11.4–15.2)

## 2020-11-27 LAB — APTT
aPTT: 82 seconds — ABNORMAL HIGH (ref 24–36)
aPTT: 66 seconds — ABNORMAL HIGH (ref 24–36)
aPTT: 97 seconds — ABNORMAL HIGH (ref 24–36)
aPTT: 67 seconds — ABNORMAL HIGH (ref 24–36)
aPTT: 67 seconds — ABNORMAL HIGH (ref 24–36)

## 2020-11-27 LAB — BODY FLUID CELL COUNT WITH DIFFERENTIAL
Eos, Fluid: 0 %
Lymphs, Fluid: 12 %
Monocyte-Macrophage-Serous Fluid: 49 % — ABNORMAL LOW (ref 50–90)
Neutrophil Count, Fluid: 39 % — ABNORMAL HIGH (ref 0–25)
Total Nucleated Cell Count, Fluid: 1192 cu mm — ABNORMAL HIGH (ref 0–1000)

## 2020-11-27 LAB — GLUCOSE, PLEURAL OR PERITONEAL FLUID: Glucose, Fluid: 96 mg/dL

## 2020-11-27 LAB — PHOSPHORUS: Phosphorus: 4.3 mg/dL (ref 2.5–4.6)

## 2020-11-27 MED ORDER — LIDOCAINE HCL (PF) 1 % IJ SOLN
INTRAMUSCULAR | Status: DC | PRN
  Administered 2020-11-27: 10 mL

## 2020-11-27 MED ORDER — LIDOCAINE HCL 1 % IJ SOLN
INTRAMUSCULAR | Status: AC
  Filled 2020-11-27: qty 20

## 2020-11-27 NOTE — Procedures (Signed)
PROCEDURE SUMMARY:  Successful image-guided left thoracentesis. Yielded 1.3 liters of dark red fluid. Patient tolerated procedure well. No immediate complications. EBL = 0 mL.  Specimen was sent for labs. CXR ordered.  Please see imaging section of Epic for full dictation.   Claris Pong Rozena Fierro PA-C 11/27/2020 11:39 AM

## 2020-11-28 LAB — APTT
aPTT: 62 seconds — ABNORMAL HIGH (ref 24–36)
aPTT: 74 seconds — ABNORMAL HIGH (ref 24–36)
aPTT: 77 seconds — ABNORMAL HIGH (ref 24–36)
aPTT: 58 seconds — ABNORMAL HIGH (ref 24–36)

## 2020-11-28 LAB — BASIC METABOLIC PANEL
Anion gap: 9 (ref 5–15)
BUN: 16 mg/dL (ref 6–20)
CO2: 31 mmol/L (ref 22–32)
Calcium: 8.3 mg/dL — ABNORMAL LOW (ref 8.9–10.3)
Chloride: 95 mmol/L — ABNORMAL LOW (ref 98–111)
Creatinine, Ser: 0.41 mg/dL — ABNORMAL LOW (ref 0.44–1.00)
GFR, Estimated: >60 mL/min (ref >60)
Glucose, Bld: 111 mg/dL — ABNORMAL HIGH (ref 70–99)
Potassium: 3.6 mmol/L (ref 3.5–5.1)
Sodium: 135 mmol/L (ref 135–145)

## 2020-11-28 LAB — CBC
HCT: 25.2 % — ABNORMAL LOW (ref 36.0–46.0)
Hemoglobin: 7.7 g/dL — ABNORMAL LOW (ref 12.0–15.0)
MCH: 30.2 pg (ref 26.0–34.0)
MCHC: 30.6 g/dL (ref 30.0–36.0)
MCV: 98.8 fL (ref 80.0–100.0)
Platelets: 247 10*3/uL (ref 150–400)
RBC: 2.55 MIL/uL — ABNORMAL LOW (ref 3.87–5.11)
RDW: 21.2 % — ABNORMAL HIGH (ref 11.5–15.5)
WBC: 7.8 10*3/uL (ref 4.0–10.5)
nRBC: 0 % (ref 0.0–0.2)

## 2020-11-28 LAB — PROTIME-INR
INR: 2.2 — ABNORMAL HIGH (ref 0.8–1.2)
Prothrombin Time: 24.1 seconds — ABNORMAL HIGH (ref 11.4–15.2)

## 2020-11-28 LAB — MAGNESIUM: Magnesium: 1.8 mg/dL (ref 1.7–2.4)

## 2020-11-28 LAB — PHOSPHORUS: Phosphorus: 3.6 mg/dL (ref 2.5–4.6)

## 2020-11-29 ENCOUNTER — Other Ambulatory Visit (HOSPITAL_COMMUNITY): Payer: 59

## 2020-11-29 LAB — URINE CULTURE: Culture: >=100000 — AB

## 2020-11-29 LAB — PROTIME-INR
INR: 2.5 — ABNORMAL HIGH (ref 0.8–1.2)
Prothrombin Time: 27.3 seconds — ABNORMAL HIGH (ref 11.4–15.2)

## 2020-11-29 LAB — BASIC METABOLIC PANEL
Anion gap: 6 (ref 5–15)
BUN: 19 mg/dL (ref 6–20)
CO2: 35 mmol/L — ABNORMAL HIGH (ref 22–32)
Calcium: 8.6 mg/dL — ABNORMAL LOW (ref 8.9–10.3)
Chloride: 96 mmol/L — ABNORMAL LOW (ref 98–111)
Creatinine, Ser: 0.4 mg/dL — ABNORMAL LOW (ref 0.44–1.00)
GFR, Estimated: >60 mL/min (ref >60)
Glucose, Bld: 107 mg/dL — ABNORMAL HIGH (ref 70–99)
Potassium: 3.7 mmol/L (ref 3.5–5.1)
Sodium: 137 mmol/L (ref 135–145)

## 2020-11-29 LAB — APTT
aPTT: 66 seconds — ABNORMAL HIGH (ref 24–36)
aPTT: 81 seconds — ABNORMAL HIGH (ref 24–36)
aPTT: 61 seconds — ABNORMAL HIGH (ref 24–36)
aPTT: 70 seconds — ABNORMAL HIGH (ref 24–36)
aPTT: 82 seconds — ABNORMAL HIGH (ref 24–36)
aPTT: 67 seconds — ABNORMAL HIGH (ref 24–36)

## 2020-11-29 LAB — CBC
HCT: 26.3 % — ABNORMAL LOW (ref 36.0–46.0)
Hemoglobin: 7.7 g/dL — ABNORMAL LOW (ref 12.0–15.0)
MCH: 30.1 pg (ref 26.0–34.0)
MCHC: 29.3 g/dL — ABNORMAL LOW (ref 30.0–36.0)
MCV: 102.7 fL — ABNORMAL HIGH (ref 80.0–100.0)
Platelets: 211 10*3/uL (ref 150–400)
RBC: 2.56 MIL/uL — ABNORMAL LOW (ref 3.87–5.11)
RDW: 21.1 % — ABNORMAL HIGH (ref 11.5–15.5)
WBC: 7.5 10*3/uL (ref 4.0–10.5)
nRBC: 0 % (ref 0.0–0.2)

## 2020-11-29 LAB — PHOSPHORUS: Phosphorus: 3.9 mg/dL (ref 2.5–4.6)

## 2020-11-29 LAB — MAGNESIUM: Magnesium: 2 mg/dL (ref 1.7–2.4)

## 2020-11-29 LAB — PATHOLOGIST SMEAR REVIEW

## 2020-11-29 NOTE — Consult Note (Addendum)
Infectious Disease Consultation   Jennifer [name]  [id number]  DOB: [birth year]  DOA: 11/25/2020  Requesting physician: Dr. Owens Shark  Reason for consultation: Antibiotic recommendations   History of Present Illness: Jennifer [name] is an 45 y.o. female with medical history significant of Marfan syndrome, pectus excavatum, mitral valve prolapse.  She presented to the acute hospital on 10/19/2020 with worsening shortness of breath.  She was admitted to the cardiology service.  On admission she was found to have severe mitral regurgitation due to her mitral valve prolapse.  She also was found to be in volume overload.  She had blood cultures that turned positive.  It showed gram-positive cocci.  She was started on IV vancomycin.  Her hospital course complicated by cardiogenic shock, biventricular failure status post Impella.  Her antibiotics were broadened to IV vancomycin, cefepime, Flagyl.  Blood cultures were positive with Granulicatella adiacens, clearing 10/25/20. On 10/31/20 she underwent MV replacement with St. Jude mechanical heart valve.  Intraoperative cultures negative.  After that she was transitioned to penicillin.  Gentamicin was added initially for synergy.  Now on treatment with IV penicillin.  Due to her complex medical problems she was transferred to [facility].  She had urine cultures collected on 11/26/2020 which are showing greater than 100,000 colonies per mL of Enterobacter cloacae.  Patient also developed right pneumothorax with chest tube in place.  Review of Systems:  Review of systems negative except as mentioned above in the HPI.  Past Medical History: Past Medical History:  Diagnosis Date  . Marfan syndrome   Aortic root dilatation (HCC), moderate 12/02/2016  4.6 cm on 12/02/2016, with mild mitral insufficiency (routine echocardiograms)  Bradycardia 06/28/2018  Heme positive stool  History of dizziness  none in past year  History of palpitations   IDA (iron deficiency anemia)  Marfan syndrome 12/02/2016  Mitral valve prolapse 12/02/2016  Pectus excavatum  congenital  PONV (postoperative nausea and vomiting)  Spigelian hernia   Past Surgical History: Past Surgical History:  Procedure Laterality Date  . HERNIA REPAIR    . IR THORACENTESIS ASP PLEURAL SPACE W/IMG GUIDE  11/27/2020  COLONOSCOPY N/A 06/28/2020  Procedure: COLONOSCOPY; Surgeon: Kathie Dike, MD; Location: [facility] ENDO OR; Service: Gastroenterology; Laterality: N/A;  ESOPHAGOSCOPY / EGD N/A 06/28/2020  Procedure: EGD; Surgeon: Kathie Dike, MD; Location: [facility] ENDO OR; Service: Gastroenterology; Laterality: N/A;  HERNIA REPAIR 2014  left abdominal [facility]  INCISIONAL HERNIA REPAIR N/A 05/11/2019  Procedure: Robotic incarcerated spigelian hernia repiar; Surgeon: Demetrius Revel, MD; Location: [facility] MAIN OR; Service: General; Laterality: N/A;  LAPAROSCOPIC TUBAL LIGATION 08/2002  NEONATAL DIAPHRAGMATIC HERNIA REPAIR  age 28 months  Mitral Valve Repair versus replacement, Median Sternotomy, Pectus Repair, 10/31/2020  Allergies:  No Known Allergies  Social History:  reports that she has never smoked. She has never used smokeless tobacco. She reports current alcohol use. She reports that she does not use drugs.   Family History: Family History  Problem Relation Age of Onset  . Marfan syndrome Father   . Hepatitis C Father   Immunodeficiency Father  Hepatitis Father  from treatment  Marfan syndrome Father  Valvular heart disease Father  aortic dilation, repair  Marfan syndrome Sister  Marfan syndrome Sister  Early death Sister 62  died at 71 years  Esophageal cancer Maternal Grandfather   Physical Exam: Temperature 96.6, heart rate 92, respiratory rate 18, blood pressure 126/60, oxygen saturation 99% Constitutional: Frail, ill-appearing  female, awake  Head: Atraumatic, normocephalic Eyes: PERLA, EOMI, irises appear normal, anicteric sclera  ENMT:  external ears and nose appear normal, normal hearing, moist oral mucosa            Neck: neck appears normal, no masses CVS: S1-S2, murmur Respiratory: Decreased breath sound lower lobes, no rhonchi or wheezing Abdomen: soft nontender, nondistended, normal bowel sounds Musculoskeletal: Trace edema Neuro: Debility with generalized weakness otherwise grossly nonfocal Psych: stable mood and affect, mental status Skin: no rashes   Data reviewed:  I have personally reviewed following labs and imaging studies Labs:  CBC: Recent Labs  Lab 11/26/20 0425 11/26/20 2303 11/28/20 0323 11/29/20 0533  WBC 8.1 9.0 7.8 7.5  NEUTROABS 6.6  --   --   --   HGB 8.0* 8.1* 7.7* 7.7*  HCT 25.8* 24.8* 25.2* 26.3*  MCV 96.6 95.4 98.8 102.7*  PLT 266 300 247 [id number]    Basic Metabolic Panel: Recent Labs  Lab 11/26/20 0425 11/26/20 2303 11/28/20 0511 11/29/20 0533  NA 134* 133* 135 137  K 3.1* 3.4* 3.6 3.7  CL 91* 87* 95* 96*  CO2 36* 36* 31 35*  GLUCOSE 126* 106* 111* 107*  BUN 14 20 16 19   CREATININE 0.47 0.50 0.41* 0.40*  CALCIUM 8.4* 8.4* 8.3* 8.6*  MG  --  1.6* 1.8 2.0  PHOS  --  4.3 3.6 3.9   GFR CrCl cannot be calculated (Unknown ideal weight.). Liver Function Tests: Recent Labs  Lab 11/26/20 2303  AST 35  ALT 29  ALKPHOS 149*  BILITOT 0.7  PROT 5.5*  ALBUMIN 1.9*   No results for input(s): LIPASE, AMYLASE in the last 168 hours. No results for input(s): AMMONIA in the last 168 hours. Coagulation profile Recent Labs  Lab 11/25/20 2052 11/26/20 0838 11/27/20 0739 11/28/20 0323 11/29/20 0533  INR 3.3* 1.5* 2.0* 2.2* 2.5*    Cardiac Enzymes: No results for input(s): CKTOTAL, CKMB, CKMBINDEX, TROPONINI in the last 168 hours. BNP: Invalid input(s): POCBNP CBG: No results for input(s): GLUCAP in the last 168 hours. D-Dimer No results for input(s): DDIMER in the last 72 hours. Hgb A1c No results for input(s): HGBA1C in the last 72 hours. Lipid Profile No results for  input(s): CHOL, HDL, LDLCALC, TRIG, CHOLHDL, LDLDIRECT in the last 72 hours. Thyroid function studies No results for input(s): TSH, T4TOTAL, T3FREE, THYROIDAB in the last 72 hours.  Invalid input(s): FREET3 Anemia work up No results for input(s): VITAMINB12, FOLATE, FERRITIN, TIBC, IRON, RETICCTPCT in the last 72 hours. Urinalysis    Component Value Date/Time   COLORURINE YELLOW 11/26/2020 1515   APPEARANCEUR CLOUDY (A) 11/26/2020 1515   LABSPEC 1.008 11/26/2020 1515   PHURINE 8.0 11/26/2020 1515   GLUCOSEU NEGATIVE 11/26/2020 1515   HGBUR MODERATE (A) 11/26/2020 1515   BILIRUBINUR NEGATIVE 11/26/2020 1515   KETONESUR NEGATIVE 11/26/2020 1515   PROTEINUR NEGATIVE 11/26/2020 1515   UROBILINOGEN 0.2 02/23/2011 1103   NITRITE NEGATIVE 11/26/2020 1515   LEUKOCYTESUR LARGE (A) 11/26/2020 1515     Sepsis Labs Invalid input(s): PROCALCITONIN,  WBC,  LACTICIDVEN Microbiology Recent Results (from the past 240 hour(s))  Culture, blood (routine x 2)     Status: None (Preliminary result)   Collection Time: 11/26/20  8:38 AM   Specimen: BLOOD LEFT WRIST  Result Value Ref Range Status   Specimen Description BLOOD LEFT WRIST  Final   Special Requests   Final    BOTTLES DRAWN AEROBIC AND ANAEROBIC Blood Culture results may not  be optimal due to an inadequate volume of blood received in culture bottles   Culture   Final    NO GROWTH 3 DAYS Performed at [facility], [name] [address] [place], [place] [address]    Report Status PENDING  Incomplete  Culture, blood (routine x 2)     Status: None (Preliminary result)   Collection Time: 11/26/20  8:38 AM   Specimen: BLOOD RIGHT HAND  Result Value Ref Range Status   Specimen Description BLOOD RIGHT HAND  Final   Special Requests   Final    BOTTLES DRAWN AEROBIC AND ANAEROBIC Blood Culture results may not be optimal due to an inadequate volume of blood received in culture bottles   Culture   Final    NO GROWTH 3 DAYS Performed at [facility], [place] [address], [place], [place] [address]    Report Status PENDING  Incomplete  SARS CORONAVIRUS 2 (TAT 6-24 HRS) Nasopharyngeal Nasopharyngeal Swab     Status: None   Collection Time: 11/26/20 11:20 AM   Specimen: Nasopharyngeal Swab  Result Value Ref Range Status   SARS Coronavirus 2 NEGATIVE NEGATIVE Final    Comment: (NOTE) SARS-CoV-2 target nucleic acids are NOT DETECTED.  The SARS-CoV-2 RNA is generally detectable in upper and lower respiratory specimens during the acute phase of infection. Negative results do not preclude SARS-CoV-2 infection, do not rule out co-infections with other pathogens, and should not be used as the sole basis for treatment or other patient management decisions. Negative results must be combined with clinical observations, patient history, and epidemiological information. The expected result is Negative.  Fact Sheet for Patients: SugarRoll.be  Fact Sheet for Healthcare Providers: https://www.woods-mathews.com/  This test is not yet approved or cleared by the [place] FDA and  has been authorized for detection and/or diagnosis of SARS-CoV-2 by FDA under an Emergency Use Authorization (EUA). This EUA will remain  in effect (meaning this test can be used) for the duration of the COVID-19 declaration under Se ction 564(b)(1) of the Act, 21 U.S.C. section 360bbb-3(b)(1), unless the authorization is terminated or revoked sooner.  Performed at [facility], [place] [address] [place], [place] [address]   Culture, Urine     Status: Abnormal   Collection Time: 11/26/20  3:15 PM   Specimen: Urine, Random  Result Value Ref Range Status   Specimen Description URINE, RANDOM  Final   Special Requests   Final    NONE Performed at [facility], [place] [address] [place],  [address]    Culture >=100,000 COLONIES/mL ENTEROBACTER CLOACAE (A)  Final   Report Status 11/29/2020 FINAL   Final   Organism ID, Bacteria ENTEROBACTER CLOACAE (A)  Final      Susceptibility   Enterobacter cloacae - MIC*    CEFAZOLIN >=64 RESISTANT Resistant     CEFEPIME 4 INTERMEDIATE Intermediate     CIPROFLOXACIN <=0.25 SENSITIVE Sensitive     GENTAMICIN <=1 SENSITIVE Sensitive     IMIPENEM <=0.25 SENSITIVE Sensitive     NITROFURANTOIN 32 SENSITIVE Sensitive     TRIMETH/SULFA <=20 SENSITIVE Sensitive     PIP/TAZO >=128 RESISTANT Resistant     * >=100,000 COLONIES/mL ENTEROBACTER CLOACAE  Body fluid culture w Gram Stain     Status: None (Preliminary result)   Collection Time: 11/27/20 12:02 PM   Specimen: Lung, Left; Pleural Fluid  Result Value Ref Range Status   Specimen Description PLEURAL FLUID  Final   Special  Requests LUNG LEFT SPEC A  Final   Gram Stain   Final    MODERATE WBC PRESENT,BOTH PMN AND MONONUCLEAR NO ORGANISMS SEEN    Culture   Final    NO GROWTH 2 DAYS Performed at [facility], [address] [place], [place] [address]    Report Status PENDING  Incomplete   Inpatient Medications:   Please see MAR  Radiological Exams on Admission: DG CHEST PORT 1 VIEW  Result Date: 11/29/2020 CLINICAL DATA:  45 year old female with right side pneumothorax. Marfan's syndrome. Mitral valve replacement last month. Right chest tube in place. EXAM: PORTABLE CHEST 1 VIEW COMPARISON:  Portable chest 11/27/2020 and earlier. FINDINGS: Portable AP upright view at 0741 hours. Stable right chest tube terminating at the right lung apex. Stable small right apical pneumothorax. Otherwise stable lines and tubes. Enteric feeding tube courses to the abdomen. Postoperative changes to the anterior chest wall, anterior ribs. Cardiomegaly with prosthetic cardiac valve. Stable cardiac size and mediastinal contours. Retrocardiac hypo ventilation and air bronchograms, with atelectasis demonstrated on CTA earlier this month. No pulmonary edema. Apical right lung scarring suspected. Stable visualized  osseous structures. IMPRESSION: 1. Stable right chest tube and small right apical pneumothorax. 2. Stable other lines and tubes. Cardiomegaly with retrocardiac atelectasis. 3. No new cardiopulmonary abnormality. Electronically Signed   By: Genevie Ann M.D.   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 Jude mechanical valve on 10/31/20 Endocarditis UTI with Enterobacter cloacae Acute hypoxemic respiratory failure, currently extubated on oxygen by nasal cannula Right-sided pneumothorax Left-sided pleural effusion status post thoracentesis Sacral pressure ulcer stage II Thrombocytopenia Dysphagia/protein calorie malnutrition Chronic atrial fibrillation history of Marfan syndrome/pectus excavatum status post chest reconstruction  Granulicatella adiacens bacteremia: She was admitted to the acute facility on 10/19/2020 and became febrile on 10/20/2020 with blood cultures at the acute facility that showed Granulicatella adiacens.  She was initially started on broad-spectrum antimicrobials.  She had repeat blood cultures on 10/24/2020 which showed 1 of 2 bottles Corynebacterium.  Her blood cultures on 10/25/2020 were negative at the acute facility.  Patient was transitioned to penicillin.  She initially received gentamicin for added synergy.  Now on treatment IV penicillin 24,000,000 units daily.  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  She had positive blood cultures as mentioned above.  Intraoperative cultures did not show any growth.  They were unable to send for 16 S RNA.  At this time suggest to continue treatment with IV penicillin 24,000,000 units daily. -Suggest to treat for duration of 6 weeks of IV antibiotics from the date of surgery which is  10/31/2020 until 12/12/2020.  Please monitor CBC, CMP while on the antibiotics. -Suggest 2D echocardiogram towards the end of her antibiotic therapy. - Patient will require lifelong antibiotic prophylaxis - Amoxicillin 2 g 60 min before dental cleaning/procedures   UTI with Enterobacter cloacae: -She had urine cultures collected on 11/26/2020 which are showing greater than 100,000 colonies per mL Enterobacter cloacae.  Patient had Foley catheter which has been removed.  She is started on meropenem in addition to the penicillin IV.  The Enterobacter cloacae is susceptible to ciprofloxacin. At this time would recommend to discontinue the meropenem and switch her to ciprofloxacin and treat for a total of 1 week for the UTI.  Acute hypoxemic respiratory failure:  In the setting of bacteremia/endocarditis.  She also had left-sided pleural effusion and is status post thoracentesis.  She has right-sided pneumothorax with chest tube in place.  Previously was intubated and now extubated. Currently on oxygen by nasal cannula.  Management per the primary team.  Sacral pressure ulcer stage II: Continue to monitor.  Continue local wound care.  Due to her debility she is high risk for worsening of the pressure ulcer.  Thrombocytopenia: Continue to monitor platelet count.  Antibiotics as mentioned above.  Dysphagia/protein calorie malnutrition: Due to her dysphagia she is at risk for aspiration and aspiration pneumonia.  If her respiratory status worsens suggest repeat chest imaging preferably chest CT without contrast to better evaluate.  Management of protein calorie malnutrition per the primary team.  Chronic atrial fibrillation: Continue medications and management per primary team.  She is on anticoagulation with Coumadin.  Due to her complex medical problems she is high risk for worsening and decompensation.   Thank you for this consultation.    Yaakov Guthrie M.D. 11/29/2020, 5:49 PM

## 2020-11-30 ENCOUNTER — Other Ambulatory Visit (HOSPITAL_COMMUNITY): Payer: 59

## 2020-11-30 LAB — CBC
HCT: 23.3 % — ABNORMAL LOW (ref 36.0–46.0)
Hemoglobin: 7 g/dL — ABNORMAL LOW (ref 12.0–15.0)
MCH: 30.4 pg (ref 26.0–34.0)
MCHC: 30 g/dL (ref 30.0–36.0)
MCV: 101.3 fL — ABNORMAL HIGH (ref 80.0–100.0)
Platelets: 156 10*3/uL (ref 150–400)
RBC: 2.3 MIL/uL — ABNORMAL LOW (ref 3.87–5.11)
RDW: 20.7 % — ABNORMAL HIGH (ref 11.5–15.5)
WBC: 5.8 10*3/uL (ref 4.0–10.5)
nRBC: 0 % (ref 0.0–0.2)

## 2020-11-30 LAB — APTT
aPTT: 78 seconds — ABNORMAL HIGH (ref 24–36)
aPTT: 57 seconds — ABNORMAL HIGH (ref 24–36)

## 2020-11-30 LAB — CREATININE, SERUM
Creatinine, Ser: 0.47 mg/dL (ref 0.44–1.00)
GFR, Estimated: >60 mL/min (ref >60)

## 2020-11-30 LAB — BODY FLUID CULTURE W GRAM STAIN: Culture: NO GROWTH

## 2020-11-30 LAB — OCCULT BLOOD X 1 CARD TO LAB, STOOL: Fecal Occult Bld: NEGATIVE

## 2020-11-30 LAB — PROTIME-INR
INR: 3.1 — ABNORMAL HIGH (ref 0.8–1.2)
Prothrombin Time: 31.9 seconds — ABNORMAL HIGH (ref 11.4–15.2)

## 2020-12-01 LAB — BASIC METABOLIC PANEL
Anion gap: 7 (ref 5–15)
BUN: 19 mg/dL (ref 6–20)
CO2: 36 mmol/L — ABNORMAL HIGH (ref 22–32)
Calcium: 8.4 mg/dL — ABNORMAL LOW (ref 8.9–10.3)
Chloride: 95 mmol/L — ABNORMAL LOW (ref 98–111)
Creatinine, Ser: 0.39 mg/dL — ABNORMAL LOW (ref 0.44–1.00)
GFR, Estimated: >60 mL/min (ref >60)
Glucose, Bld: 103 mg/dL — ABNORMAL HIGH (ref 70–99)
Potassium: 3.8 mmol/L (ref 3.5–5.1)
Sodium: 138 mmol/L (ref 135–145)

## 2020-12-01 LAB — CULTURE, BLOOD (ROUTINE X 2)
Culture: NO GROWTH
Culture: NO GROWTH

## 2020-12-01 LAB — CBC
HCT: 24.5 % — ABNORMAL LOW (ref 36.0–46.0)
Hemoglobin: 7.5 g/dL — ABNORMAL LOW (ref 12.0–15.0)
MCH: 31 pg (ref 26.0–34.0)
MCHC: 30.6 g/dL (ref 30.0–36.0)
MCV: 101.2 fL — ABNORMAL HIGH (ref 80.0–100.0)
Platelets: 163 10*3/uL (ref 150–400)
RBC: 2.42 MIL/uL — ABNORMAL LOW (ref 3.87–5.11)
RDW: 20.8 % — ABNORMAL HIGH (ref 11.5–15.5)
WBC: 5.5 10*3/uL (ref 4.0–10.5)
nRBC: 0 % (ref 0.0–0.2)

## 2020-12-01 LAB — MAGNESIUM: Magnesium: 1.9 mg/dL (ref 1.7–2.4)

## 2020-12-01 LAB — PHOSPHORUS: Phosphorus: 3.7 mg/dL (ref 2.5–4.6)

## 2020-12-01 LAB — PROTIME-INR
INR: 3.4 — ABNORMAL HIGH (ref 0.8–1.2)
Prothrombin Time: 34 seconds — ABNORMAL HIGH (ref 11.4–15.2)

## 2020-12-02 LAB — PROTIME-INR
INR: 4 — ABNORMAL HIGH (ref 0.8–1.2)
Prothrombin Time: 39.3 seconds — ABNORMAL HIGH (ref 11.4–15.2)

## 2020-12-03 ENCOUNTER — Other Ambulatory Visit (HOSPITAL_COMMUNITY): Payer: 59

## 2020-12-03 LAB — BASIC METABOLIC PANEL
Anion gap: 6 (ref 5–15)
BUN: 15 mg/dL (ref 6–20)
CO2: 40 mmol/L — ABNORMAL HIGH (ref 22–32)
Calcium: 8.6 mg/dL — ABNORMAL LOW (ref 8.9–10.3)
Chloride: 92 mmol/L — ABNORMAL LOW (ref 98–111)
Creatinine, Ser: 0.4 mg/dL — ABNORMAL LOW (ref 0.44–1.00)
GFR, Estimated: >60 mL/min (ref >60)
Glucose, Bld: 93 mg/dL (ref 70–99)
Potassium: 3.6 mmol/L (ref 3.5–5.1)
Sodium: 138 mmol/L (ref 135–145)

## 2020-12-03 LAB — PROTIME-INR
INR: 3.2 — ABNORMAL HIGH (ref 0.8–1.2)
Prothrombin Time: 32.4 seconds — ABNORMAL HIGH (ref 11.4–15.2)

## 2020-12-03 LAB — CBC
HCT: 25.7 % — ABNORMAL LOW (ref 36.0–46.0)
Hemoglobin: 7.9 g/dL — ABNORMAL LOW (ref 12.0–15.0)
MCH: 31.6 pg (ref 26.0–34.0)
MCHC: 30.7 g/dL (ref 30.0–36.0)
MCV: 102.8 fL — ABNORMAL HIGH (ref 80.0–100.0)
Platelets: 158 10*3/uL (ref 150–400)
RBC: 2.5 MIL/uL — ABNORMAL LOW (ref 3.87–5.11)
RDW: 20.6 % — ABNORMAL HIGH (ref 11.5–15.5)
WBC: 6.7 10*3/uL (ref 4.0–10.5)
nRBC: 0 % (ref 0.0–0.2)

## 2020-12-04 LAB — PROTIME-INR
INR: 3.5 — ABNORMAL HIGH (ref 0.8–1.2)
Prothrombin Time: 35 seconds — ABNORMAL HIGH (ref 11.4–15.2)

## 2020-12-05 ENCOUNTER — Encounter
Admission: RE | Disposition: A | Discharge status: Still In Hospital | Payer: Self-pay | Source: Home / Self Care | Attending: Internal Medicine

## 2020-12-05 ENCOUNTER — Encounter (HOSPITAL_COMMUNITY): Payer: 59 | Admitting: Anesthesiology

## 2020-12-05 ENCOUNTER — Other Ambulatory Visit (HOSPITAL_COMMUNITY): Payer: 59

## 2020-12-05 LAB — CBC
HCT: 26.8 % — ABNORMAL LOW (ref 36.0–46.0)
Hemoglobin: 8 g/dL — ABNORMAL LOW (ref 12.0–15.0)
MCH: 31 pg (ref 26.0–34.0)
MCHC: 29.9 g/dL — ABNORMAL LOW (ref 30.0–36.0)
MCV: 103.9 fL — ABNORMAL HIGH (ref 80.0–100.0)
Platelets: 164 10*3/uL (ref 150–400)
RBC: 2.58 MIL/uL — ABNORMAL LOW (ref 3.87–5.11)
RDW: 20.5 % — ABNORMAL HIGH (ref 11.5–15.5)
WBC: 6 10*3/uL (ref 4.0–10.5)
nRBC: 0 % (ref 0.0–0.2)

## 2020-12-05 LAB — BASIC METABOLIC PANEL
Anion gap: 7 (ref 5–15)
BUN: 16 mg/dL (ref 6–20)
CO2: 42 mmol/L — ABNORMAL HIGH (ref 22–32)
Calcium: 8.7 mg/dL — ABNORMAL LOW (ref 8.9–10.3)
Chloride: 91 mmol/L — ABNORMAL LOW (ref 98–111)
Creatinine, Ser: 0.42 mg/dL — ABNORMAL LOW (ref 0.44–1.00)
GFR, Estimated: >60 mL/min (ref >60)
Glucose, Bld: 92 mg/dL (ref 70–99)
Potassium: 3.3 mmol/L — ABNORMAL LOW (ref 3.5–5.1)
Sodium: 140 mmol/L (ref 135–145)

## 2020-12-05 LAB — PROTIME-INR
INR: 4.3 (ref 0.8–1.2)
Prothrombin Time: 40.9 seconds — ABNORMAL HIGH (ref 11.4–15.2)

## 2020-12-05 SURGERY — CANCELLED PROCEDURE

## 2020-12-05 NOTE — Progress Notes (Signed)
PROGRESS NOTE    Jennifer [name]  [id number] DOB: November 16, 1975 DOA: 11/25/2020  Brief Narrative:  Jennifer [name] is an 45 y.o. female with medical history significant of Marfan syndrome, pectus excavatum, mitral valve prolapse.  She presented to the acute hospital on 10/19/2020 with worsening shortness of breath.  She was admitted to the cardiology service.  On admission she was found to have severe mitral regurgitation due to her mitral valve prolapse.  She also was found to be in volume overload.  She had blood cultures that turned positive.  It showed gram-positive cocci.  She was started on IV vancomycin.  Her hospital course complicated by cardiogenic shock, biventricular failure status post Impella.  Her antibiotics were broadened to IV vancomycin, cefepime, Flagyl.  Blood cultures were positive with Granulicatella adiacens, clearing 10/25/20. On 10/31/20 she underwent MV replacement with St. Jude mechanical heart valve.  Intraoperative cultures negative.  After that she was transitioned to penicillin.  Gentamicin was added initially for synergy.  Now on treatment with IV penicillin.  Due to her complex medical problems she was transferred to [facility].  She had urine cultures collected on 11/26/2020 which are showing greater than 100,000 colonies per mL of Enterobacter cloacae.  Patient also developed right pneumothorax with chest tube in place.   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 Jude mechanical valve on 10/31/20 Endocarditis UTI with Enterobacter cloacae Acute hypoxemic respiratory failure, currently extubated on oxygen by nasal cannula Right-sided pneumothorax Left-sided pleural effusion status post thoracentesis Sacral pressure ulcer stage II Thrombocytopenia Dysphagia/protein calorie malnutrition Chronic atrial fibrillation history of Marfan  syndrome/pectus excavatum status post chest reconstruction  Granulicatella adiacens bacteremia: She was admitted to the acute facility on 10/19/2020 and became febrile on 10/20/2020 with blood cultures at the acute facility that showed Granulicatella adiacens.  She was initially started on broad-spectrum antimicrobials.  She had repeat blood cultures on 10/24/2020 which showed 1 of 2 bottles Corynebacterium.  Her blood cultures on 10/25/2020 were negative at the acute facility.  Patient was transitioned to penicillin.  She initially received gentamicin for added synergy.  Now on treatment IV penicillin 24,000,000 units daily.  Appears to be stable at this time.  However, if she starts having any worsening fevers, leukocytosis suggest to send for repeat pancultures.  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  She had positive blood cultures as mentioned above.  Intraoperative cultures did not show any growth.  They were unable to send for 16 S RNA.  At this time suggest to continue treatment with IV penicillin 24,000,000 units daily. -Suggest to treat for duration of 6 weeks of IV antibiotics from the date of surgery which is 10/31/2020 until 12/12/2020.  Please monitor CBC, CMP while on the antibiotics. -Suggest 2D echocardiogram towards the end of her antibiotic therapy. - Patient will require lifelong antibiotic prophylaxis - Amoxicillin 2 g 60 min before dental cleaning/procedures. - Given her prolonged antibiotic course she is at risk for C. difficile.  If she starts having any diarrhea suggest to send stool for C. difficile and started on empiric p.o. vancomycin.  UTI with Enterobacter cloacae: -She had urine cultures collected on 11/26/2020 which are showing greater than 100,000 colonies per mL Enterobacter cloacae.  Patient had Foley catheter which has been removed.  She was started on  meropenem in  addition to the penicillin IV.  The Enterobacter cloacae is susceptible to ciprofloxacin. Recommended to discontinue the meropenem and switch her to ciprofloxacin and treat for a total of 1 week for the UTI.  Acute hypoxemic respiratory failure: In the setting of bacteremia/endocarditis.  She also had left-sided pleural effusion and is status post thoracentesis.  She has right-sided pneumothorax with chest tube in place.  Previously was intubated and now extubated. Currently on oxygen by nasal cannula.  Management per the primary team. - Patient encouraged to do incentive spirometry.  Sacral pressure ulcer stage II: Continue to monitor.  Continue local wound care.  Due to her debility she is high risk for worsening of the pressure ulcer.  Thrombocytopenia: Continue to monitor platelet count.  Antibiotics as mentioned above.  Dysphagia/protein calorie malnutrition: Due to her dysphagia she is at risk for aspiration and aspiration pneumonia.  If her respiratory status worsens, suggest repeat chest imaging preferably chest CT without contrast to better evaluate.  Management of protein calorie malnutrition per the primary team.  Chronic atrial fibrillation: Continue medications and management per primary team.  She is on anticoagulation with Coumadin.  Due to her complex medical problems she is high risk for worsening and decompensation.    Subjective: She is complaining of some shortness of breath.  She is on oxygen by nasal cannula.  Denies having any fevers, chills, nausea, vomiting, abdominal pain, diarrhea or dysuria at this time.  Objective: Temperature 97.4, pulse 105, respiratory rate 18, blood pressure 118/72, pulse oximetry 100%  Examination: Constitutional: Frail, ill-appearing female, awake  Head: Atraumatic, normocephalic Eyes: PERLA, EOMI, irises appear normal, anicteric sclera  ENMT: external ears and nose appear normal, normal hearing, moist oral mucosa             Neck: neck appears normal, no masses CVS: S1-S2, murmur Respiratory: Decreased breath sound lower lobes, no rhonchi or wheezing Abdomen: soft nontender, nondistended, normal bowel sounds Musculoskeletal: Trace edema Neuro: Debility with generalized weakness otherwise grossly nonfocal Psych: stable mood and affect, mental status Skin: no rashes   Data Reviewed: I have personally reviewed following labs and imaging studies  CBC: Recent Labs  Lab 11/29/20 0533 11/30/20 0240 12/01/20 0336 12/03/20 0454 12/05/20 0340  WBC 7.5 5.8 5.5 6.7 6.0  HGB 7.7* 7.0* 7.5* 7.9* 8.0*  HCT 26.3* 23.3* 24.5* 25.7* 26.8*  MCV 102.7* 101.3* 101.2* 102.8* 103.9*  PLT 211 156 163 158 983    Basic Metabolic Panel: Recent Labs  Lab 11/29/20 0533 11/30/20 0240 12/01/20 0336 12/03/20 0454 12/05/20 0340  NA 137  --  138 138 140  K 3.7  --  3.8 3.6 3.3*  CL 96*  --  95* 92* 91*  CO2 35*  --  36* 40* 42*  GLUCOSE 107*  --  103* 93 92  BUN 19  --  19 15 16   CREATININE 0.40* 0.47 0.39* 0.40* 0.42*  CALCIUM 8.6*  --  8.4* 8.6* 8.7*  MG 2.0  --  1.9  --   --   PHOS 3.9  --  3.7  --   --     GFR: CrCl cannot be calculated (Unknown ideal weight.).  Liver Function Tests: No results for input(s): AST, ALT, ALKPHOS, BILITOT, PROT, ALBUMIN in the last 168 hours.  CBG: No results for input(s): GLUCAP in the last 168 hours.   Recent Results (from the past 240 hour(s))  Culture, blood (routine x 2)     Status: None   Collection Time:  11/26/20  8:38 AM   Specimen: BLOOD LEFT WRIST  Result Value Ref Range Status   Specimen Description BLOOD LEFT WRIST  Final   Special Requests   Final    BOTTLES DRAWN AEROBIC AND ANAEROBIC Blood Culture results may not be optimal due to an inadequate volume of blood received in culture bottles   Culture   Final    NO GROWTH 5 DAYS Performed at [facility], [place] [address] [place], [place] [address]    Report Status 12/01/2020 FINAL  Final  Culture,  blood (routine x 2)     Status: None   Collection Time: 11/26/20  8:38 AM   Specimen: BLOOD RIGHT HAND  Result Value Ref Range Status   Specimen Description BLOOD RIGHT HAND  Final   Special Requests   Final    BOTTLES DRAWN AEROBIC AND ANAEROBIC Blood Culture results may not be optimal due to an inadequate volume of blood received in culture bottles   Culture   Final    NO GROWTH 5 DAYS Performed at [facility], [place] [address], [place], [place] [address]    Report Status 12/01/2020 FINAL  Final  SARS CORONAVIRUS 2 (TAT 6-24 HRS) Nasopharyngeal Nasopharyngeal Swab     Status: None   Collection Time: 11/26/20 11:20 AM   Specimen: Nasopharyngeal Swab  Result Value Ref Range Status   SARS Coronavirus 2 NEGATIVE NEGATIVE Final    Comment: (NOTE) SARS-CoV-2 target nucleic acids are NOT DETECTED.  The SARS-CoV-2 RNA is generally detectable in upper and lower respiratory specimens during the acute phase of infection. Negative results do not preclude SARS-CoV-2 infection, do not rule out co-infections with other pathogens, and should not be used as the sole basis for treatment or other patient management decisions. Negative results must be combined with clinical observations, patient history, and epidemiological information. The expected result is Negative.  Fact Sheet for Patients: SugarRoll.be  Fact Sheet for Healthcare Providers: https://www.woods-mathews.com/  This test is not yet approved or cleared by the [place] FDA and  has been authorized for detection and/or diagnosis of SARS-CoV-2 by FDA under an Emergency Use Authorization (EUA). This EUA will remain  in effect (meaning this test can be used) for the duration of the COVID-19 declaration under Se ction 564(b)(1) of the Act, 21 U.S.C. section 360bbb-3(b)(1), unless the authorization is terminated or revoked sooner.  Performed at [facility], [place] [address],  [place], [place] [address]   Culture, Urine     Status: Abnormal   Collection Time: 11/26/20  3:15 PM   Specimen: Urine, Random  Result Value Ref Range Status   Specimen Description URINE, RANDOM  Final   Special Requests   Final    NONE Performed at [facility], [place] [address] [place], [place] [address]    Culture >=100,000 COLONIES/mL ENTEROBACTER CLOACAE (A)  Final   Report Status 11/29/2020 FINAL  Final   Organism ID, Bacteria ENTEROBACTER CLOACAE (A)  Final      Susceptibility   Enterobacter cloacae - MIC*    CEFAZOLIN >=64 RESISTANT Resistant     CEFEPIME 4 INTERMEDIATE Intermediate     CIPROFLOXACIN <=0.25 SENSITIVE Sensitive     GENTAMICIN <=1 SENSITIVE Sensitive     IMIPENEM <=0.25 SENSITIVE Sensitive     NITROFURANTOIN 32 SENSITIVE Sensitive     TRIMETH/SULFA <=20 SENSITIVE Sensitive     PIP/TAZO >=128 RESISTANT Resistant     * >=100,000 COLONIES/mL ENTEROBACTER CLOACAE  Body fluid culture w Gram Stain     Status: None   Collection Time: 11/27/20 12:02 PM   Specimen: Lung, Left; Pleural Fluid  Result Value Ref Range Status   Specimen Description PLEURAL FLUID  Final   Special Requests LUNG LEFT SPEC A  Final   Gram Stain   Final    MODERATE WBC PRESENT,BOTH PMN AND MONONUCLEAR NO ORGANISMS SEEN    Culture   Final    NO GROWTH 3 DAYS Performed at [facility], [address], [place], [place] [address]    Report Status 11/30/2020 FINAL  Final     Radiology Studies: No results found.   Scheduled Meds: Please see MAR  LOS: 0 days   Yaakov Guthrie, MD 12/05/2020, 3:47 PM

## 2020-12-05 NOTE — Anesthesia Procedure Notes (Signed)
Procedure Name: MAC Performed by: Rendy Lazard B, CRNA Pre-anesthesia Checklist: Patient identified, Emergency Drugs available, Suction available and Patient being monitored Patient Re-evaluated:Patient Re-evaluated prior to induction Oxygen Delivery Method: Nasal cannula Preoxygenation: Pre-oxygenation with 100% oxygen Induction Type: IV induction Placement Confirmation: CO2 detector and positive ETCO2 Dental Injury: Teeth and Oropharynx as per pre-operative assessment        

## 2020-12-06 ENCOUNTER — Other Ambulatory Visit (HOSPITAL_COMMUNITY): Payer: 59

## 2020-12-06 LAB — PROTIME-INR
INR: 3.3 — ABNORMAL HIGH (ref 0.8–1.2)
Prothrombin Time: 33.8 seconds — ABNORMAL HIGH (ref 11.4–15.2)

## 2020-12-06 LAB — POTASSIUM: Potassium: 3.3 mmol/L — ABNORMAL LOW (ref 3.5–5.1)

## 2020-12-07 LAB — PROTIME-INR
INR: 2.5 — ABNORMAL HIGH (ref 0.8–1.2)
Prothrombin Time: 27.3 seconds — ABNORMAL HIGH (ref 11.4–15.2)

## 2020-12-07 LAB — POTASSIUM: Potassium: 4.1 mmol/L (ref 3.5–5.1)

## 2020-12-08 LAB — PROTIME-INR
INR: 1.9 — ABNORMAL HIGH (ref 0.8–1.2)
Prothrombin Time: 21.8 seconds — ABNORMAL HIGH (ref 11.4–15.2)

## 2020-12-08 LAB — CBC
HCT: 27.2 % — ABNORMAL LOW (ref 36.0–46.0)
Hemoglobin: 8 g/dL — ABNORMAL LOW (ref 12.0–15.0)
MCH: 31.1 pg (ref 26.0–34.0)
MCHC: 29.4 g/dL — ABNORMAL LOW (ref 30.0–36.0)
MCV: 105.8 fL — ABNORMAL HIGH (ref 80.0–100.0)
Platelets: 157 10*3/uL (ref 150–400)
RBC: 2.57 MIL/uL — ABNORMAL LOW (ref 3.87–5.11)
RDW: 20 % — ABNORMAL HIGH (ref 11.5–15.5)
WBC: 6.6 10*3/uL (ref 4.0–10.5)
nRBC: 0 % (ref 0.0–0.2)

## 2020-12-08 LAB — BASIC METABOLIC PANEL
Anion gap: 6 (ref 5–15)
BUN: 18 mg/dL (ref 6–20)
CO2: 41 mmol/L — ABNORMAL HIGH (ref 22–32)
Calcium: 8.8 mg/dL — ABNORMAL LOW (ref 8.9–10.3)
Chloride: 94 mmol/L — ABNORMAL LOW (ref 98–111)
Creatinine, Ser: 0.39 mg/dL — ABNORMAL LOW (ref 0.44–1.00)
GFR, Estimated: >60 mL/min (ref >60)
Glucose, Bld: 94 mg/dL (ref 70–99)
Potassium: 3.8 mmol/L (ref 3.5–5.1)
Sodium: 141 mmol/L (ref 135–145)

## 2020-12-08 LAB — MAGNESIUM: Magnesium: 1.7 mg/dL (ref 1.7–2.4)

## 2020-12-09 ENCOUNTER — Encounter (HOSPITAL_COMMUNITY): Payer: 59 | Admitting: Anesthesiology

## 2020-12-09 ENCOUNTER — Encounter (HOSPITAL_COMMUNITY): Payer: Self-pay | Admitting: Internal Medicine

## 2020-12-09 ENCOUNTER — Other Ambulatory Visit (HOSPITAL_COMMUNITY): Payer: 59

## 2020-12-09 ENCOUNTER — Encounter
Admission: RE | Disposition: A | Discharge status: Still In Hospital | Payer: Self-pay | Source: Home / Self Care | Attending: Internal Medicine

## 2020-12-09 HISTORY — PX: BUBBLE STUDY: SHX6837

## 2020-12-09 HISTORY — PX: TEE WITHOUT CARDIOVERSION: SHX5443

## 2020-12-09 LAB — CBC
HCT: 26.2 % — ABNORMAL LOW (ref 36.0–46.0)
Hemoglobin: 7.7 g/dL — ABNORMAL LOW (ref 12.0–15.0)
MCH: 31.2 pg (ref 26.0–34.0)
MCHC: 29.4 g/dL — ABNORMAL LOW (ref 30.0–36.0)
MCV: 106.1 fL — ABNORMAL HIGH (ref 80.0–100.0)
Platelets: 145 10*3/uL — ABNORMAL LOW (ref 150–400)
RBC: 2.47 MIL/uL — ABNORMAL LOW (ref 3.87–5.11)
RDW: 19.2 % — ABNORMAL HIGH (ref 11.5–15.5)
WBC: 6.1 10*3/uL (ref 4.0–10.5)
nRBC: 0 % (ref 0.0–0.2)

## 2020-12-09 LAB — PROTIME-INR
INR: 1.5 — ABNORMAL HIGH (ref 0.8–1.2)
Prothrombin Time: 18.4 seconds — ABNORMAL HIGH (ref 11.4–15.2)

## 2020-12-09 LAB — MAGNESIUM: Magnesium: 2 mg/dL (ref 1.7–2.4)

## 2020-12-09 SURGERY — ECHOCARDIOGRAM, TRANSESOPHAGEAL
Anesthesia: Monitor Anesthesia Care

## 2020-12-09 MED ORDER — PROPOFOL 500 MG/50ML IV EMUL
INTRAVENOUS | Status: DC | PRN
  Administered 2020-12-09: 100 ug/kg/min via INTRAVENOUS

## 2020-12-09 MED ORDER — LABETALOL HCL 5 MG/ML IV SOLN: 20.0000 mg | Freq: Once | INTRAVENOUS | Status: DC

## 2020-12-09 MED ORDER — WARFARIN - PHYSICIAN DOSING INPATIENT: Freq: Every day | Status: DC

## 2020-12-09 MED ORDER — SODIUM CHLORIDE 0.9 % IV SOLN: Freq: Once | INTRAVENOUS | Status: DC

## 2020-12-09 MED ORDER — WARFARIN SODIUM 5 MG PO TABS
5.0000 mg | ORAL_TABLET | ORAL | Status: AC
  Administered 2020-12-09: 5 mg via ORAL
  Filled 2020-12-09: qty 1

## 2020-12-09 MED ORDER — SODIUM CHLORIDE 0.9 % IV SOLN: INTRAVENOUS | Status: DC

## 2020-12-09 MED ORDER — PROPOFOL 10 MG/ML IV BOLUS
INTRAVENOUS | Status: DC | PRN
  Administered 2020-12-09: 30 mg via INTRAVENOUS
  Administered 2020-12-09: 20 mg via INTRAVENOUS

## 2020-12-09 MED ORDER — SODIUM CHLORIDE 0.9 % IV SOLN: INTRAVENOUS | Status: DC | PRN

## 2020-12-09 MED ORDER — LABETALOL HCL 5 MG/ML IV SOLN
INTRAVENOUS | Status: DC | PRN
  Administered 2020-12-09: 5 mg via INTRAVENOUS
  Administered 2020-12-09: 10 mg via INTRAVENOUS

## 2020-12-09 NOTE — CV Procedure (Signed)
INDICATIONS:   The patient is 45 years old female with Marfan syndrome, MVP/MV endocarditis and severe MR had St. Jude mechanical MV replacement on 10/31/2020.Marland Kitchen  PROCEDURE:  Informed consent was discussed including risks, benefits and alternatives for the procedure.  Risks include, but are not limited to, cough, sore throat, vomiting, nausea, somnolence, esophageal and stomach trauma or perforation, bleeding, low blood pressure, aspiration, pneumonia, infection, trauma to the teeth and death.    Patient was given sedation.  The oropharynx was anesthetized with topical lidocaine.  The transesophageal probe was inserted in the esophagus and stomach and multiple views were obtained.  Agitated saline was used after the transesophageal probe was removed from the body.  The patient was kept under observation until the patient left the procedure room.  The patient left the procedure room in stable condition.   COMPLICATIONS:  There were no immediate complications.  FINDINGS:  1. LEFT VENTRICLE: The left ventricle is normal in structure and has mild generalized hypokinesia.  No thrombus or masses seen in the left ventricle.  2. RIGHT VENTRICLE:  The right ventricle is normal in structure and has moderate to severe hypokinesia without any thrombus or masses.    3. LEFT ATRIUM:  The left atrium is dilated without any thrombus or masses.  4. LEFT ATRIAL APPENDAGE:  The left atrial appendage is small and free of any thrombus or masses.  5. RIGHT ATRIUM:  The right atrium is dilated and free of any thrombus or masses.    6. ATRIAL SEPTUM:  The atrial septum is normal with ASD or PFO.  7. MITRAL VALVE:  The mitral valve is prosthetic and moves well.with  Bilateral paravalvular small leaks and without masses, stenosis or vegetations.  8. TRICUSPID VALVE:  The tricuspid valve is normal in structure and function with severe multijet regurgitation. No masses, stenosis or vegetations.  9. AORTIC VALVE:  The  aortic valve is normal in structure and function with trivial regurgitation, no masses, stenosis or vegetations.  10. PULMONIC VALVE:  The pulmonic valve is normal in structure and function with trivial regurgitation, no masses, stenosis or vegetations.  11. AORTIC ARCH, ASCENDING AND DESCENDING AORTA:  The aorta had no atherosclerosis in the ascending or descending aorta.  The aortic arch was normal.  12.  Superior Vena Cava : No thrombus or catheter.  13.  Pulmonary Veins: Visible.  14.  Pulmonary artery: visible and normal.   IMPRESSION:   1. Mild LV systolic dysfunction. 2. Moderate to severe RV systolic dysfunction. 3. Prosthetic MV moves well. 4. Small perivalvular, MV leaks. 5. Severe TR. 6. PFO presented  RECOMMENDATIONS:    Resume warfarin with INR 3.0 +/- 0.5.Marland Kitchen

## 2020-12-09 NOTE — Progress Notes (Signed)
OT Cancellation Note  Patient Details Name: Jennifer [name] MRN: [id number] DOB: 07-25-1976   Cancelled Treatment:    Reason Eval/Treat Not Completed: (P) Patient at procedure or test/ unavailable  [facility] 12/09/2020, 7:51 AM Maurie Boettcher, OT/L   Acute OT Clinical Specialist Acute Rehabilitation Services Pager [phone] Office [phone]

## 2020-12-09 NOTE — Anesthesia Postprocedure Evaluation (Signed)
Anesthesia Post Note  Patient: Jennifer [name]  Procedure(s) Performed: TRANSESOPHAGEAL ECHOCARDIOGRAM (TEE) (N/A ) BUBBLE STUDY     Patient location during evaluation: PACU Anesthesia Type: MAC Level of consciousness: awake and alert Pain management: pain level controlled Vital Signs Assessment: post-procedure vital signs reviewed and stable Respiratory status: spontaneous breathing, nonlabored ventilation, respiratory function stable and patient connected to nasal cannula oxygen Cardiovascular status: stable and blood pressure returned to baseline Postop Assessment: no apparent nausea or vomiting Anesthetic complications: no   No complications documented.  Last Vitals:  Vitals:   12/09/20 0922 12/09/20 0931  BP: 96/61 106/67  Pulse: (!) 113 (!) 113  Resp: (!) 23 20  Temp:    SpO2: 95% 98%    Last Pain:  Vitals:   12/09/20 0911  TempSrc: Oral  PainSc: 0-No pain                 Lorece Keach S

## 2020-12-09 NOTE — Anesthesia Preprocedure Evaluation (Addendum)
Anesthesia Evaluation  Patient identified by MRN, date of birth, ID band Patient awake    Reviewed: Allergy & Precautions, NPO status , Patient's Chart, lab work & pertinent test results  Airway Mallampati: II  TM Distance: >3 FB Neck ROM: Full    Dental no notable dental hx.    Pulmonary neg pulmonary ROS,    Pulmonary exam normal breath sounds clear to auscultation       Cardiovascular +CHF  + Valvular Problems/Murmurs MR  Rhythm:Regular Rate:Normal + Systolic murmurs Marfan syndrome   Neuro/Psych negative neurological ROS  negative psych ROS   GI/Hepatic negative GI ROS, Neg liver ROS,   Endo/Other  negative endocrine ROS  Renal/GU negative Renal ROS  negative genitourinary   Musculoskeletal negative musculoskeletal ROS (+)   Abdominal   Peds negative pediatric ROS (+)  Hematology  (+) anemia ,   Anesthesia Other Findings   Reproductive/Obstetrics negative OB ROS                            Anesthesia Physical Anesthesia Plan  ASA: III  Anesthesia Plan: MAC   Post-op Pain Management:    Induction: Intravenous  PONV Risk Score and Plan: 1 and Propofol infusion and Treatment may vary due to age or medical condition  Airway Management Planned: Simple Face Mask  Additional Equipment:   Intra-op Plan:   Post-operative Plan:   Informed Consent: I have reviewed the patients History and Physical, chart, labs and discussed the procedure including the risks, benefits and alternatives for the proposed anesthesia with the patient or authorized representative who has indicated his/her understanding and acceptance.     Dental advisory given  Plan Discussed with: CRNA and Surgeon  Anesthesia Plan Comments:        Anesthesia Quick Evaluation

## 2020-12-09 NOTE — Progress Notes (Signed)
Ref: Karlene Einstein, MD   Subjective:  Here for evaluation for endocarditis  Objective:  Vital Signs in the last 24 hours: Temp:  [97.8 F (36.6 C)] 97.8 F (36.6 C) (05/09 0737) Pulse Rate:  [121-122] 121 (05/09 0755) Resp:  [21] 21 (05/09 0737) BP: (113-119)/(71) 113/71 (05/09 0755) SpO2:  [97 %] 97 % (05/09 0737) Weight:  [65.8 kg] 65.8 kg (05/09 0737)  Physical Exam: BP Readings from Last 1 Encounters:  12/09/20 113/71     Wt Readings from Last 1 Encounters:  12/09/20 65.8 kg    Weight change:  Body mass index is 20.81 kg/m. HEENT: [place]/AT, Eyes-Blue, Conjunctiva-Pink, Sclera-Non-icteric Neck: No JVD, No bruit, Trachea midline. Lungs:  Clear, Bilateral. Cardiac:  Regular rhythm, normal S1 and S2 metallic, no S3. III/VI systolic murmur. Abdomen:  Soft, non-tender. BS present. Extremities:  No edema present. No cyanosis. No clubbing. CNS: AxOx3, Cranial nerves grossly intact, moves all 4 extremities.  Skin: Warm and dry.   Intake/Output from previous day: No intake/output data recorded.    Lab Results: BMET    Component Value Date/Time   NA 141 12/08/2020 0220   NA 140 12/05/2020 0340   NA 138 12/03/2020 0454   K 3.8 12/08/2020 0220   K 4.1 12/07/2020 0500   K 3.3 (L) 12/06/2020 0426   CL 94 (L) 12/08/2020 0220   CL 91 (L) 12/05/2020 0340   CL 92 (L) 12/03/2020 0454   CO2 41 (H) 12/08/2020 0220   CO2 42 (H) 12/05/2020 0340   CO2 40 (H) 12/03/2020 0454   GLUCOSE 94 12/08/2020 0220   GLUCOSE 92 12/05/2020 0340   GLUCOSE 93 12/03/2020 0454   BUN 18 12/08/2020 0220   BUN 16 12/05/2020 0340   BUN 15 12/03/2020 0454   CREATININE 0.39 (L) 12/08/2020 0220   CREATININE 0.42 (L) 12/05/2020 0340   CREATININE 0.40 (L) 12/03/2020 0454   CALCIUM 8.8 (L) 12/08/2020 0220   CALCIUM 8.7 (L) 12/05/2020 0340   CALCIUM 8.6 (L) 12/03/2020 0454   GFRNONAA >60 12/08/2020 0220   GFRNONAA >60 12/05/2020 0340   GFRNONAA >60 12/03/2020 0454   CBC    Component Value  Date/Time   WBC 6.6 12/08/2020 0220   RBC 2.57 (L) 12/08/2020 0220   HGB 8.0 (L) 12/08/2020 0220   HCT 27.2 (L) 12/08/2020 0220   PLT 157 12/08/2020 0220   MCV 105.8 (H) 12/08/2020 0220   MCH 31.1 12/08/2020 0220   MCHC 29.4 (L) 12/08/2020 0220   RDW 20.0 (H) 12/08/2020 0220   LYMPHSABS 0.9 11/26/2020 0425   MONOABS 0.4 11/26/2020 0425   EOSABS 0.1 11/26/2020 0425   BASOSABS 0.1 11/26/2020 0425   HEPATIC Function Panel Recent Labs    11/26/20 2303  PROT 5.5*   HEMOGLOBIN A1C No components found for: HGA1C,  MPG CARDIAC ENZYMES No results found for: CKTOTAL, CKMB, CKMBINDEX, TROPONINI BNP No results for input(s): PROBNP in the last 8760 hours. TSH No results for input(s): TSH in the last 8760 hours. CHOLESTEROL No results for input(s): CHOL in the last 8760 hours.  Scheduled Meds: Continuous Infusions: . sodium chloride 10 mL/hr at 12/09/20 0746   PRN Meds:.lidocaine (PF)  Assessment/Plan: H/O MV endocarditis A. Fibrillation S/P St jude mechanical MV Acute on chronic respiratory failure with hypoxia Anemia of blood loss Marfan syndrome Thoracic aortic aneurysm  TEE today.   LOS: 0 days   Time spent including chart review, lab review, examination, discussion with patient : 25 min  Dixie Dials  MD  12/09/2020, 8:34 AM

## 2020-12-09 NOTE — Transfer of Care (Signed)
Immediate Anesthesia Transfer of Care Note  Patient: Jennifer [name]  Procedure(s) Performed: TRANSESOPHAGEAL ECHOCARDIOGRAM (TEE) (N/A ) BUBBLE STUDY  Patient Location: Endoscopy Unit  Anesthesia Type:MAC  Level of Consciousness: drowsy and patient cooperative  Airway & Oxygen Therapy: Patient Spontanous Breathing and Patient connected to nasal cannula oxygen  Post-op Assessment: Report given to RN and Post -op Vital signs reviewed and stable  Post vital signs: Reviewed and stable  Last Vitals:  Vitals Value Taken Time  BP 96/54 12/09/20 0914  Temp 36.6 C 12/09/20 0911  Pulse 113 12/09/20 0914  Resp 22 12/09/20 0914  SpO2 94 % 12/09/20 0914  Vitals shown include unvalidated device data.  Last Pain:  Vitals:   12/09/20 0911  TempSrc: Oral  PainSc: 0-No pain         Complications: No complications documented.

## 2020-12-09 NOTE — Anesthesia Procedure Notes (Signed)
Procedure Name: MAC Date/Time: 12/09/2020 8:42 AM Performed by: Kathryne Hitch, CRNA Pre-anesthesia Checklist: Patient identified, Emergency Drugs available, Suction available and Patient being monitored Patient Re-evaluated:Patient Re-evaluated prior to induction Oxygen Delivery Method: Nasal cannula Preoxygenation: Pre-oxygenation with 100% oxygen Induction Type: IV induction Ventilation: Mask ventilation without difficulty Placement Confirmation: positive ETCO2 Dental Injury: Teeth and Oropharynx as per pre-operative assessment

## 2020-12-10 LAB — PROTIME-INR
INR: 1.8 — ABNORMAL HIGH (ref 0.8–1.2)
Prothrombin Time: 20.5 seconds — ABNORMAL HIGH (ref 11.4–15.2)

## 2020-12-11 ENCOUNTER — Other Ambulatory Visit (HOSPITAL_COMMUNITY): Payer: 59

## 2020-12-11 ENCOUNTER — Encounter (HOSPITAL_COMMUNITY): Payer: Self-pay | Admitting: Cardiovascular Disease

## 2020-12-11 LAB — BASIC METABOLIC PANEL
Anion gap: 5 (ref 5–15)
BUN: 10 mg/dL (ref 6–20)
CO2: 41 mmol/L — ABNORMAL HIGH (ref 22–32)
Calcium: 9.2 mg/dL (ref 8.9–10.3)
Chloride: 95 mmol/L — ABNORMAL LOW (ref 98–111)
Creatinine, Ser: 0.31 mg/dL — ABNORMAL LOW (ref 0.44–1.00)
GFR, Estimated: >60 mL/min (ref >60)
Glucose, Bld: 96 mg/dL (ref 70–99)
Potassium: 3.9 mmol/L (ref 3.5–5.1)
Sodium: 141 mmol/L (ref 135–145)

## 2020-12-11 LAB — CBC
HCT: 28.8 % — ABNORMAL LOW (ref 36.0–46.0)
Hemoglobin: 8.4 g/dL — ABNORMAL LOW (ref 12.0–15.0)
MCH: 31 pg (ref 26.0–34.0)
MCHC: 29.2 g/dL — ABNORMAL LOW (ref 30.0–36.0)
MCV: 106.3 fL — ABNORMAL HIGH (ref 80.0–100.0)
Platelets: 151 10*3/uL (ref 150–400)
RBC: 2.71 MIL/uL — ABNORMAL LOW (ref 3.87–5.11)
RDW: 18.8 % — ABNORMAL HIGH (ref 11.5–15.5)
WBC: 4.7 10*3/uL (ref 4.0–10.5)
nRBC: 0 % (ref 0.0–0.2)

## 2020-12-11 LAB — PROTIME-INR
INR: 2.2 — ABNORMAL HIGH (ref 0.8–1.2)
Prothrombin Time: 24.8 seconds — ABNORMAL HIGH (ref 11.4–15.2)

## 2020-12-11 LAB — PHOSPHORUS: Phosphorus: 3.5 mg/dL (ref 2.5–4.6)

## 2020-12-11 LAB — MAGNESIUM: Magnesium: 1.8 mg/dL (ref 1.7–2.4)

## 2020-12-12 ENCOUNTER — Other Ambulatory Visit (HOSPITAL_COMMUNITY): Payer: 59

## 2020-12-12 LAB — PROTIME-INR
INR: 2.9 — ABNORMAL HIGH (ref 0.8–1.2)
Prothrombin Time: 30 seconds — ABNORMAL HIGH (ref 11.4–15.2)

## 2020-12-13 ENCOUNTER — Other Ambulatory Visit (HOSPITAL_COMMUNITY): Payer: 59

## 2020-12-13 LAB — BASIC METABOLIC PANEL
Anion gap: 7 (ref 5–15)
BUN: 14 mg/dL (ref 6–20)
CO2: 42 mmol/L — ABNORMAL HIGH (ref 22–32)
Calcium: 8.8 mg/dL — ABNORMAL LOW (ref 8.9–10.3)
Chloride: 92 mmol/L — ABNORMAL LOW (ref 98–111)
Creatinine, Ser: 0.39 mg/dL — ABNORMAL LOW (ref 0.44–1.00)
GFR, Estimated: >60 mL/min (ref >60)
Glucose, Bld: 91 mg/dL (ref 70–99)
Potassium: 3.6 mmol/L (ref 3.5–5.1)
Sodium: 141 mmol/L (ref 135–145)

## 2020-12-13 LAB — PHOSPHORUS: Phosphorus: 4.3 mg/dL (ref 2.5–4.6)

## 2020-12-13 LAB — CBC
HCT: 30.3 % — ABNORMAL LOW (ref 36.0–46.0)
Hemoglobin: 8.8 g/dL — ABNORMAL LOW (ref 12.0–15.0)
MCH: 30.3 pg (ref 26.0–34.0)
MCHC: 29 g/dL — ABNORMAL LOW (ref 30.0–36.0)
MCV: 104.5 fL — ABNORMAL HIGH (ref 80.0–100.0)
Platelets: 201 10*3/uL (ref 150–400)
RBC: 2.9 MIL/uL — ABNORMAL LOW (ref 3.87–5.11)
RDW: 18.4 % — ABNORMAL HIGH (ref 11.5–15.5)
WBC: 5.6 10*3/uL (ref 4.0–10.5)
nRBC: 0 % (ref 0.0–0.2)

## 2020-12-13 LAB — PROTIME-INR
INR: 4 — ABNORMAL HIGH (ref 0.8–1.2)
Prothrombin Time: 38.9 seconds — ABNORMAL HIGH (ref 11.4–15.2)

## 2020-12-13 LAB — MAGNESIUM: Magnesium: 1.9 mg/dL (ref 1.7–2.4)

## 2020-12-13 MED ORDER — LIDOCAINE HCL 1 % IJ SOLN
INTRAMUSCULAR | Status: AC
  Filled 2020-12-13: qty 20

## 2020-12-13 NOTE — Progress Notes (Signed)
Request to IR for right thoracentesis, patient currently with right sided chest tube in place due to previous apical pneumothorax. CXR today showed possible right and left pleural effusions.   [place] of left chest does not show any pleural fluid which is amenable to percutaneous aspiration. [place] of right chest shows small amount of pleural fluid which is potentially amenable to thoracentesis however with increased risk due to previous pneumothorax and current chest tube in place - discussed increased risk of worsening pneumothorax as well as likely little symptomatic benefit from aspiration of minimal right pleural fluid with patient today who decided to hold off on thoracentesis as this time. She would like to wait to see if her symptoms worsen or the fluid accumulates more before proceeding.  No procedure today, patient returned to floor in stable condition.  Candiss Norse, PA-C

## 2020-12-14 ENCOUNTER — Other Ambulatory Visit (HOSPITAL_COMMUNITY): Payer: 59

## 2020-12-14 LAB — CBC
HCT: 30.8 % — ABNORMAL LOW (ref 36.0–46.0)
Hemoglobin: 9.1 g/dL — ABNORMAL LOW (ref 12.0–15.0)
MCH: 30.8 pg (ref 26.0–34.0)
MCHC: 29.5 g/dL — ABNORMAL LOW (ref 30.0–36.0)
MCV: 104.4 fL — ABNORMAL HIGH (ref 80.0–100.0)
Platelets: 206 10*3/uL (ref 150–400)
RBC: 2.95 MIL/uL — ABNORMAL LOW (ref 3.87–5.11)
RDW: 18.3 % — ABNORMAL HIGH (ref 11.5–15.5)
WBC: 5.2 10*3/uL (ref 4.0–10.5)
nRBC: 0 % (ref 0.0–0.2)

## 2020-12-14 LAB — PROTIME-INR
INR: 3.6 — ABNORMAL HIGH (ref 0.8–1.2)
Prothrombin Time: 35.8 seconds — ABNORMAL HIGH (ref 11.4–15.2)

## 2020-12-15 ENCOUNTER — Other Ambulatory Visit (HOSPITAL_COMMUNITY): Payer: 59

## 2020-12-15 LAB — CBC
HCT: 30.1 % — ABNORMAL LOW (ref 36.0–46.0)
Hemoglobin: 8.9 g/dL — ABNORMAL LOW (ref 12.0–15.0)
MCH: 30.7 pg (ref 26.0–34.0)
MCHC: 29.6 g/dL — ABNORMAL LOW (ref 30.0–36.0)
MCV: 103.8 fL — ABNORMAL HIGH (ref 80.0–100.0)
Platelets: 242 10*3/uL (ref 150–400)
RBC: 2.9 MIL/uL — ABNORMAL LOW (ref 3.87–5.11)
RDW: 18 % — ABNORMAL HIGH (ref 11.5–15.5)
WBC: 6.2 10*3/uL (ref 4.0–10.5)
nRBC: 0 % (ref 0.0–0.2)

## 2020-12-15 LAB — PROTIME-INR
INR: 2.7 — ABNORMAL HIGH (ref 0.8–1.2)
Prothrombin Time: 28.5 seconds — ABNORMAL HIGH (ref 11.4–15.2)

## 2020-12-15 NOTE — Progress Notes (Signed)
PROGRESS NOTE    Jennifer [name]  XHB:[id number] DOB: [birth year] DOA: 11/25/2020  Brief Narrative:  Jennifer [name] is an 45 y.o. female with medical history significant of Marfan syndrome, pectus excavatum, mitral valve prolapse.  She presented to the acute hospital on 10/19/2020 with worsening shortness of breath.  She was admitted to the cardiology service.  On admission she was found to have severe mitral regurgitation due to her mitral valve prolapse.  She also was found to be in volume overload.  She had blood cultures that turned positive.  It showed gram-positive cocci.  She was started on IV vancomycin.  Her hospital course complicated by cardiogenic shock, biventricular failure status post Impella.  Her antibiotics were broadened to IV vancomycin, cefepime, Flagyl.  Blood cultures were positive with Granulicatella adiacens, clearing 10/25/20. On 10/31/20 she underwent MV replacement with St. Jude mechanical heart valve.  Intraoperative cultures negative.  After that she was transitioned to penicillin.  Gentamicin was added initially for synergy.  Now on treatment with IV penicillin.  Due to her complex medical problems she was transferred to [facility].  She had urine cultures collected on 11/26/2020 which are showing greater than 100,000 colonies per mL of Enterobacter cloacae.  Patient also developed right pneumothorax with chest tube in place. 12/15/2020: Patient completed antibiotics on 12/12/2020.  Remains afebrile.   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 Jude mechanical valve on 10/31/20 Endocarditis UTI with Enterobacter cloacae Acute hypoxemic respiratory failure, currently extubated on oxygen by nasal cannula Right-sided pneumothorax Left-sided pleural effusion status post thoracentesis Sacral pressure ulcer stage II Thrombocytopenia Dysphagia/protein calorie  malnutrition Chronic atrial fibrillation history of Marfan syndrome/pectus excavatum status post chest reconstruction  Granulicatella adiacens bacteremia: She was admitted to the acute facility on 10/19/2020 and became febrile on 10/20/2020 with blood cultures at the acute facility that showed Granulicatella adiacens.  She was initially started on broad-spectrum antimicrobials.  She had repeat blood cultures on 10/24/2020 which showed 1 of 2 bottles Corynebacterium.  Her blood cultures on 10/25/2020 were negative at the acute facility.  Patient was transitioned to penicillin.  She initially received gentamicin for added synergy.    After that received treatment IV penicillin 24,000,000 units daily which she completed on 12/12/2020.  Appears to be stable at this time.  However, if she starts having any worsening fevers, leukocytosis suggest to send for repeat pancultures.  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  She had positive blood cultures as mentioned above.  Intraoperative cultures did not show any growth.  They were unable to send for 16 S RNA.  At this time suggest to continue treatment with IV penicillin 24,000,000 units daily. -Suggest to treat for duration of 6 weeks of IV antibiotics from the date of surgery which is 10/31/2020 which she completed on 12/12/2020.   -Suggest 2D echocardiogram towards the end of her antibiotic therapy. - Patient will require lifelong antibiotic prophylaxis - Amoxicillin 2 g 60 min before dental cleaning/procedures. - Given her prolonged antibiotic course she is at risk for C. difficile.  If she starts having any diarrhea suggest to send stool for C. difficile and start on empiric p.o. vancomycin.  UTI with Enterobacter cloacae: -She had urine cultures collected on 11/26/2020 which are showing greater than 100,000 colonies per mL Enterobacter  cloacae.  Patient had  Foley catheter which has been removed.  She was started on meropenem in addition to the penicillin IV.  The Enterobacter cloacae was susceptible to ciprofloxacin.  Therefore recommended to discontinue the meropenem and switch her to ciprofloxacin and treated for  week for the UTI.  She denies having any urinary symptoms at this time.  Acute hypoxemic respiratory failure: In the setting of bacteremia/endocarditis.  She also had left-sided pleural effusion and is status post thoracentesis.  She has right-sided pneumothorax with chest tube in place.  Previously was intubated and now extubated. Currently on oxygen by nasal cannula.  Management per the primary team. - Patient encouraged to do incentive spirometry.  Sacral pressure ulcer stage II: Continue to monitor.  Continue local wound care.  Due to her debility she is high risk for worsening of the pressure ulcer.  Thrombocytopenia: Continue to monitor platelet count.    Completed antibiotics as mentioned above.Dysphagia/protein calorie malnutrition: Due to her dysphagia she is at risk for aspiration and aspiration pneumonia.  If her respiratory status worsens, suggest repeat chest imaging preferably chest CT without contrast to better evaluate.  Management of protein calorie malnutrition per the primary team.  Chronic atrial fibrillation: Continue medications and management per primary team.  She is on anticoagulation with Coumadin.  Due to her complex medical problems she is high risk for worsening and decompensation.    Subjective: She is complaining of some shortness of breath.  She is on oxygen by nasal cannula.  Denies having any fevers, chills, nausea, vomiting, abdominal pain, diarrhea or dysuria at this time.  Objective: Temperature 97.1, pulse 90, respiratory rate 14, blood pressure 101/60, oxygen saturation 100%   Examination: Constitutional: Frail appearing female, awake  Head: Atraumatic,  normocephalic Eyes: PERLA, EOMI, irises appear normal, anicteric sclera  ENMT: external ears and nose appear normal, normal hearing, moist oral mucosa            Neck: neck appears normal, no masses CVS: S1-S2, murmur Respiratory: Decreased breath sound lower lobes, no rhonchi or wheezing Abdomen: soft nontender, nondistended, normal bowel sounds Musculoskeletal: Trace edema Neuro: Debility with generalized weakness otherwise grossly nonfocal Psych: stable mood and affect, mental status Skin: no rashes   Data Reviewed: I have personally reviewed following labs and imaging studies  CBC: Recent Labs  Lab 12/09/20 1033 12/11/20 0429 12/13/20 0347 12/14/20 0452 12/15/20 0352  WBC 6.1 4.7 5.6 5.2 6.2  HGB 7.7* 8.4* 8.8* 9.1* 8.9*  HCT 26.2* 28.8* 30.3* 30.8* 30.1*  MCV 106.1* 106.3* 104.5* 104.4* 103.8*  PLT 145* 151 201 206 081    Basic Metabolic Panel: Recent Labs  Lab 12/09/20 1033 12/11/20 0429 12/13/20 0347  NA  --  141 141  K  --  3.9 3.6  CL  --  95* 92*  CO2  --  41* 42*  GLUCOSE  --  96 91  BUN  --  10 14  CREATININE  --  0.31* 0.39*  CALCIUM  --  9.2 8.8*  MG 2.0 1.8 1.9  PHOS  --  3.5 4.3    GFR: Estimated Creatinine Clearance: 94.2 mL/min (A) (by C-G formula based on SCr of 0.39 mg/dL (L)).  Liver Function Tests: No results for input(s): AST, ALT, ALKPHOS, BILITOT, PROT, ALBUMIN in the last 168 hours.  CBG: No results for input(s): GLUCAP in the last 168 hours.   No results found for this or any previous visit (from the past 240 hour(s)).   Radiology Studies: DG CHEST PORT 1  VIEW  Result Date: 12/15/2020 CLINICAL DATA:  Shortness of breath. EXAM: PORTABLE CHEST 1 VIEW COMPARISON:  12/14/2020 FINDINGS: Right chest tube remains in place with tip over the right apex. There is right pleural fluid without visible pneumothorax. The right PICC line is been removed. Bilateral lower lung zone interstitial and airspace opacities are unchanged. Stable cardiac  enlargement. Previous median sternotomy and valve repair. IMPRESSION: 1. No change in aeration to the lungs compared with prior exam. 2. Stable right chest tube. No pneumothorax. Electronically Signed   By: Kerby Moors M.D.   On: 12/15/2020 07:12   DG Chest Port 1 View  Result Date: 12/14/2020 CLINICAL DATA:  Shortness of breath.  Chest tube EXAM: PORTABLE CHEST 1 VIEW COMPARISON:  Yesterday FINDINGS: The left more than right lower pulmonary opacification with small right pleural effusion. There was ultrasound pleural fluid evaluation yesterday. Right chest tube with tip at the apex. No pneumothorax. Right PICC with tip at the SVC. Postoperative chest wall and heart. Cardiomegaly. IMPRESSION: Unchanged since 12/11/2020, as above. Electronically Signed   By: Monte Fantasia M.D.   On: 12/14/2020 07:10     Scheduled Meds: . labetalol  20 mg Intravenous Once  . Warfarin - Physician Dosing Inpatient   Does not apply q1600   Please see MAR  LOS: 0 days   Yaakov Guthrie, MD 12/15/2020, 5:23 PM

## 2020-12-16 ENCOUNTER — Other Ambulatory Visit (HOSPITAL_COMMUNITY): Payer: 59

## 2020-12-16 LAB — PROTIME-INR
INR: 2.4 — ABNORMAL HIGH (ref 0.8–1.2)
Prothrombin Time: 26.2 seconds — ABNORMAL HIGH (ref 11.4–15.2)

## 2020-12-17 ENCOUNTER — Other Ambulatory Visit (HOSPITAL_COMMUNITY): Payer: 59

## 2020-12-17 LAB — CBC
HCT: 31.4 % — ABNORMAL LOW (ref 36.0–46.0)
Hemoglobin: 9.2 g/dL — ABNORMAL LOW (ref 12.0–15.0)
MCH: 30.4 pg (ref 26.0–34.0)
MCHC: 29.3 g/dL — ABNORMAL LOW (ref 30.0–36.0)
MCV: 103.6 fL — ABNORMAL HIGH (ref 80.0–100.0)
Platelets: 283 10*3/uL (ref 150–400)
RBC: 3.03 MIL/uL — ABNORMAL LOW (ref 3.87–5.11)
RDW: 17.4 % — ABNORMAL HIGH (ref 11.5–15.5)
WBC: 9.3 10*3/uL (ref 4.0–10.5)
nRBC: 0 % (ref 0.0–0.2)

## 2020-12-17 LAB — BASIC METABOLIC PANEL
Anion gap: 6 (ref 5–15)
BUN: 21 mg/dL — ABNORMAL HIGH (ref 6–20)
CO2: 44 mmol/L — ABNORMAL HIGH (ref 22–32)
Calcium: 8.9 mg/dL (ref 8.9–10.3)
Chloride: 88 mmol/L — ABNORMAL LOW (ref 98–111)
Creatinine, Ser: 0.45 mg/dL (ref 0.44–1.00)
GFR, Estimated: >60 mL/min (ref >60)
Glucose, Bld: 56 mg/dL — ABNORMAL LOW (ref 70–99)
Potassium: 3.1 mmol/L — ABNORMAL LOW (ref 3.5–5.1)
Sodium: 138 mmol/L (ref 135–145)

## 2020-12-17 LAB — PROTIME-INR
INR: 2.3 — ABNORMAL HIGH (ref 0.8–1.2)
Prothrombin Time: 25.1 seconds — ABNORMAL HIGH (ref 11.4–15.2)

## 2020-12-17 LAB — MAGNESIUM: Magnesium: 2 mg/dL (ref 1.7–2.4)

## 2020-12-18 ENCOUNTER — Other Ambulatory Visit (HOSPITAL_COMMUNITY): Payer: 59

## 2020-12-18 LAB — PROTIME-INR
INR: 2.8 — ABNORMAL HIGH (ref 0.8–1.2)
Prothrombin Time: 29.8 seconds — ABNORMAL HIGH (ref 11.4–15.2)

## 2020-12-18 LAB — POTASSIUM: Potassium: 3.8 mmol/L (ref 3.5–5.1)

## 2020-12-19 LAB — PROTIME-INR
INR: 3.3 — ABNORMAL HIGH (ref 0.8–1.2)
Prothrombin Time: 33.5 seconds — ABNORMAL HIGH (ref 11.4–15.2)

## 2020-12-19 NOTE — PMR Pre-admission (Signed)
PMR Admission Coordinator Pre-Admission Assessment  Patient: Jennifer [name] is an 45 y.o., female MRN: [id number] DOB: 1976-02-09 Height: _0  (1.778 m) Weight: 65.8 kg  Insurance Information HMO:     PPO:      PCP:      IPA:      80/20:      OTHER:  PRIMARYIval Bible      Policy#: [id number]      Subscriber: pt CM Name: Jackelyn Poling      Phone#: [phone]     Fax#: [phone] Pre-Cert#: [id number] approved for 7 days      Employer: [employer] Benefits:  Phone #: [phone]     Name: 5/18 Eff. Date: 08/03/2017     Deduct: $300      Out of Pocket Max: $7900 CIR: 80%      SNF: 80% limited to 120 days per year Outpatient: $20      Co-Pay: visits per medical neccesity Home Health: 80%      Co-Pay: visits per medical neccesity DME: 80%     Co-Pay: 20% Providers: in network  SECONDARY: none        Financial Counselor:       Phone#:   [employer]" for patients in Inpatient Rehabilitation Facilities with attached "Privacy Act [place] Records" was provided and verbally reviewed with: N/A  Emergency Contact Information Contact Information    Name Relation Home Work [place] [phone]      Current Medical History  Patient Admitting Diagnosis: Debility  History of Present Illness: 45 year old female with previous medical history of Marfan syndrome, mitral valve prolapse and known mild to moderate regurg . Has been reportedly on atenolol since she was a teenager and been on losartan for a few years. Presented to [place] ED on 10/19/2020 due to worsening shortness of breath. This has been noted to be progressive and dyspnea on exertion over several months, but worsened for two weeks, with abrupt onset of bilateral lower extremity swelling up to the thighs. Outpatient workup by hematology, gastroenterology and rheumatology without definitive conclusion.   While under cariology  service, patient found to have gram positive bacteremia with subacute MV endocarditis, treated with Penicillin G and gentamicin.On 10/31/20 taken to the OR for mitral valve replacement with ST Jude mechanical valve and partial modified Ravitch chest wall reconstruction. Returned to OR on 11/02/20 for reexploration with mediastinal washout, complex chest wall reconstruction with stabilization of the sternum with Recon Talon plate and a ladder plate. Modified Ravitch procedure. Chest wall reconstruction using Strattice mesh 10 x 10 cm. Closure of sternotomy. Again returned to OR 11/04/20 for Impella placement and placement of right chest tube.. Extubated 4/8 and Impella removed 11/12/20. Transferred to floor from ICU on 11/23/20. Penicillin G daily for total of 6 weeks with end date of 12/12/2020. Warfarin with INR goals of 3.0-3.5 for mechanical mitral valve. Recommended lifelong antibiotic prophylaxis of Amoxicillin 2 g 60 min before dental cleaning/procedures. Postoperative atrial fibrillation on amiodarone and coumadin .   Chest tube clamp trial 4/25 with small increase in right apical PTX and new development of tiny right basilar PTX. Felt patient likely to have a space at least initially due to the severity of her pectus excavatum and the difference in chest cavity volume after her repair. Had stage 2-3 sacral decubitus to lower back. Recommended to clean with soap an water. apply baz and barrier cream to sacrum  with santyl in lower sacrum to open dark area then cover with sacral meplix 2 times per day. On nocturnal tube feeding of Osmolite due to decreased po intake.   Admitted to [facility] on 11/25/20 for ongoing rehabilitation and wound care with chest tube. Chest tube eventually removed on 12/18/20 with follow up chest x ray with no evidence of pneumothorax. Latest INR 3.3 on Coumadin. Stable on O2 via [place] up to 0.5 to 1 liters and maintaining good saturations. Does get SOB with activity. CXR 5/13 showed  possible right and left pleural effusions . IR consulted and [place] of left chest did not show any pleural fluid amenable to percutaneous aspiration. [place] of right chest showed small amount of pleural fluid which was potentially amenable to thoracentesis however with increased risk with chest tube in place, thoracentesis was not pursued. Chronic atrial fibrillation with need for continued heart rate monitoring. Protein calorie malnutrition now off nocturnal feeds and to continue with heart healthy diet with dietician follow up. Generalized weakness due to prolonged hospitalization with need for continued PT and OT prior to discharge home.  Patient's medical record from [facility] has been reviewed by the rehabilitation admission coordinator and physician.  Past Medical History  Past Medical History:  Diagnosis Date  . Marfan syndrome     Family History   family history includes Hepatitis C in her father; Marfan syndrome in her father.  Prior Rehab/Hospitalizations Has the patient had prior rehab or hospitalizations prior to admission? Yes  Has the patient had major surgery during 100 days prior to admission? Yes   Current Medications Admelog Amiodarone ASA Bupropion Digestive advantage caps Diltiazem HCL Dronabinol Ferrous sulfate Fluconazole Wafarin Lidoderm patch Losartan potassium Melatonin Metoprolol tartrate Pantoprazole sodium Pro-stat Sertraline HCL Simethicone Tb a Vite Thiamine HCL Vitamin C  Patients Current Diet: Diet  Heart Health Diet  Precautions / Restrictions Precautions: Sternal; Fall   Has the patient had 2 or more falls or a fall with injury in the past year? 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 Continued to work, but has been remote due to  the pandemic  Prior Functional Level Self Care: Did the patient need help bathing, dressing, using the toilet or eating? Independent  Indoor Mobility: Did the patient need assistance with walking from room to room (with or without device)? Independent  Stairs: Did the patient need assistance with internal or external stairs (with or without device)? Independent  Functional Cognition: Did the patient need help planning regular tasks such as shopping or remembering to take medications? Independent  Home Assistive Devices / Equipment None   Prior Device Use: Indicate devices/aids used by the patient prior to current illness, exacerbation or injury? None of the above   Prior Functional Level Current Functional Level  Bed Mobility  Independent Min assist   Transfers  Independent  Min assist; Mod assist transfer just couple of steps to chair. Gets SOB with activity. Using 0.5 liters [place] O2. HR 117 to 127 with activity and at rest. 5/20 ambulated 10 feet with min to mod assist   Mobility - Walk/Wheelchair  Independent  Mod assist   Upper Body Dressing  Independent  Mod assist   Lower Body Dressing  Independent  Max assist   Grooming  Independent  Min assist  Eating/Drinking  Independent  Mod Independent   Toilet Transfer  Independent  Mod assist   Bladder Continence   continent  use of purewick external device   Bowel Management  continent  continent   Stair Climbing  Independent  Other (not attempted)   Communication  independent  independent   Memory  intact intact     Special Needs/ Care Considerations O2 at 0.5 to 1 liters [place]. Not on home oxygen  Previous Home Environment  Living Arrangements: Spouse/significant other; Children  Lives With: Spouse; Family Available Help at Discharge: Family; Friend(s); Available 24 hours/day (family and friends will arrange 24/7 supervision) Type of Home: House Home Layout: Two level; Laundry or work area in  basement; Full bath on main level (her home office is in the finished basement) Home Access: Stairs to enter Entrance Stairs-Rails: Right; Left; Can reach both Entrance Stairs-Number of Steps: 12 Bathroom Shower/Tub: Tub/shower unit; Door [employer] Toilet: Handicapped height Bathroom Accessibility: Yes How Accessible: Accessible via walker Home Care Services: No  Discharge Living Setting Plans for Discharge Living Setting: Patient's home; Lives with (comment) (spouse and children, 3) Type of Home at Discharge: House Discharge Home Layout: Two level; Laundry or work area in basement; Full bath on main level; Able to live on main level with bedroom/bathroom Discharge Home Access: Stairs to enter Entrance Stairs-Rails: Right; Left; Can reach both Entrance Stairs-Number of Steps: 12 Discharge Bathroom Shower/Tub: Tub/shower unit; Door Discharge Bathroom Toilet: Handicapped height Discharge Bathroom Accessibility: Yes How Accessible: Accessible via walker Does the patient have any problems obtaining your medications?: No  Social/Family/Support Systems Patient Roles: Spouse; Parent ([employer] employee, IT department working remote) Contact Information: spouse, Legrand Como Anticipated Caregiver: spouse and various family members Anticipated Caregiver's Contact Information: [phone] Ability/Limitations of Caregiver: spouse works beginning at 5 pm, various other family memebrs can provide 24/7 supervision Caregiver Availability: 24/7 Discharge Plan Discussed with Primary Caregiver: Yes Is Caregiver In Agreement with Plan?: Yes Does Caregiver/Family have Issues with Lodging/Transportation while Pt is in Rehab?: No  Goals Patient/Family Goal for Rehab: supervision PT, supervision to min OT Expected length of stay: ELOS 10 to 14 days Pt/Family Agrees to Admission and willing to participate: Yes Program Orientation Provided & Reviewed with Pt/Caregiver Including Roles  & Responsibilities:  Yes  Decrease burden of Care through IP rehab admission: n/a  Possible need for SNF placement upon discharge: not anticipated  Patient Condition: I have reviewed medical records from [facility], spoken with CSW, and patient. I met with patient at the bedside for inpatient rehabilitation assessment.  Patient will benefit from ongoing PT and OT, can actively participate in 3 hours of therapy a day 5 days of the week, and can make measurable gains during the admission.  Patient will also benefit from the coordinated team approach during an Inpatient Acute Rehabilitation admission.  The patient will receive intensive therapy as well as Rehabilitation physician, nursing, social worker, and care management interventions.  Due to bladder management, bowel management, safety, skin/wound care, disease management, medication administration, pain management and patient education the patient requires 24 hour a day rehabilitation nursing.  The patient is currently mod assist overall with mobility and basic ADLs.  Discharge setting and therapy post discharge at home with home health is anticipated.  Patient has agreed to participate in the Acute Inpatient Rehabilitation Program and will admit today.  Preadmission Screen Completed By:  Cleatrice Burke, 12/23/2020 11:41 AM ______________________________________________________________________   Discussed status with Dr. Naaman Plummer on 12/23/2020 at  1145 and received approval for admission today.  Admission Coordinator:  Cleatrice Burke, RN, time  5430 Date  12/23/2020   Assessment/Plan: Diagnosis: debility 1. Does the need for close, 24 hr/day Medical supervision in concert with the patient's rehab needs make it unreasonable for this patient to be served in a less intensive setting? Yes 2. Co-Morbidities requiring supervision/potential complications: Marfan Syndrome with MVP, bacteremia/endocarditis/MVR, persistent pleural effusions and CT,  dysphagia 3. Due to bladder management, bowel management, safety, skin/wound care, disease management, medication administration, pain management and patient education, does the patient require 24 hr/day rehab nursing? Yes 4. Does the patient require coordinated care of a physician, rehab nurse, PT, OT, and SLP to address physical and functional deficits in the context of the above medical diagnosis(es)? Yes Addressing deficits in the following areas: balance, endurance, locomotion, strength, transferring, bowel/bladder control, bathing, dressing, feeding, grooming, toileting and psychosocial support 5. Can the patient actively participate in an intensive therapy program of at least 3 hrs of therapy 5 days a week? Yes 6. The potential for patient to make measurable gains while on inpatient rehab is excellent 7. Anticipated functional outcomes upon discharge from inpatient rehab: supervision PT, supervision and min assist OT, n/a SLP 8. Estimated rehab length of stay to reach the above functional goals is: 10-14 days 9. Anticipated discharge destination: Home 10. Overall Rehab/Functional Prognosis: excellent  MD Signature Meredith Staggers, MD, [facility] 12/23/2020

## 2020-12-20 LAB — PROTIME-INR
INR: 3.5 — ABNORMAL HIGH (ref 0.8–1.2)
Prothrombin Time: 35 seconds — ABNORMAL HIGH (ref 11.4–15.2)

## 2020-12-21 LAB — BASIC METABOLIC PANEL WITH GFR
Anion gap: 9 (ref 5–15)
BUN: 15 mg/dL (ref 6–20)
CO2: 38 mmol/L — ABNORMAL HIGH (ref 22–32)
Calcium: 8.8 mg/dL — ABNORMAL LOW (ref 8.9–10.3)
Chloride: 92 mmol/L — ABNORMAL LOW (ref 98–111)
Creatinine, Ser: 0.41 mg/dL — ABNORMAL LOW (ref 0.44–1.00)
GFR, Estimated: >60 mL/min
Glucose, Bld: 96 mg/dL (ref 70–99)
Potassium: 3.8 mmol/L (ref 3.5–5.1)
Sodium: 139 mmol/L (ref 135–145)

## 2020-12-21 LAB — PROTIME-INR
INR: 3.8 — ABNORMAL HIGH (ref 0.8–1.2)
Prothrombin Time: 37.6 seconds — ABNORMAL HIGH (ref 11.4–15.2)

## 2020-12-21 LAB — CBC
HCT: 29 % — ABNORMAL LOW (ref 36.0–46.0)
Hemoglobin: 8.6 g/dL — ABNORMAL LOW (ref 12.0–15.0)
MCH: 29.6 pg (ref 26.0–34.0)
MCHC: 29.7 g/dL — ABNORMAL LOW (ref 30.0–36.0)
MCV: 99.7 fL (ref 80.0–100.0)
Platelets: 274 10*3/uL (ref 150–400)
RBC: 2.91 MIL/uL — ABNORMAL LOW (ref 3.87–5.11)
RDW: 16.7 % — ABNORMAL HIGH (ref 11.5–15.5)
WBC: 5.5 10*3/uL (ref 4.0–10.5)
nRBC: 0 % (ref 0.0–0.2)

## 2020-12-21 LAB — MAGNESIUM: Magnesium: 1.9 mg/dL (ref 1.7–2.4)

## 2020-12-22 LAB — PROTIME-INR
INR: 3.2 — ABNORMAL HIGH (ref 0.8–1.2)
Prothrombin Time: 32.7 seconds — ABNORMAL HIGH (ref 11.4–15.2)

## 2020-12-23 ENCOUNTER — Encounter (HOSPITAL_COMMUNITY): Payer: Self-pay | Admitting: Physical Medicine & Rehabilitation

## 2020-12-23 ENCOUNTER — Inpatient Hospital Stay (HOSPITAL_COMMUNITY)
Admission: RE | Admit: 2020-12-23 | Discharge: 2021-01-10 | DRG: 945 | Disposition: A | Discharge status: Home / Self Care | Payer: 59 | Source: Other Acute Inpatient Hospital | Attending: Physical Medicine & Rehabilitation | Admitting: Physical Medicine & Rehabilitation

## 2020-12-23 ENCOUNTER — Other Ambulatory Visit: Payer: Self-pay

## 2020-12-23 DIAGNOSIS — J9600 Acute respiratory failure, unspecified whether with hypoxia or hypercapnia: Secondary | ICD-10-CM | POA: Diagnosis not present

## 2020-12-23 DIAGNOSIS — R110 Nausea: Secondary | ICD-10-CM | POA: Diagnosis not present

## 2020-12-23 DIAGNOSIS — Z8774 Personal history of (corrected) congenital malformations of heart and circulatory system: Secondary | ICD-10-CM | POA: Diagnosis not present

## 2020-12-23 DIAGNOSIS — I482 Chronic atrial fibrillation, unspecified: Secondary | ICD-10-CM | POA: Diagnosis not present

## 2020-12-23 DIAGNOSIS — I712 Thoracic aortic aneurysm, without rupture: Secondary | ICD-10-CM | POA: Diagnosis present

## 2020-12-23 DIAGNOSIS — Q211 Atrial septal defect: Secondary | ICD-10-CM | POA: Diagnosis not present

## 2020-12-23 DIAGNOSIS — K5900 Constipation, unspecified: Secondary | ICD-10-CM | POA: Diagnosis present

## 2020-12-23 DIAGNOSIS — L89152 Pressure ulcer of sacral region, stage 2: Secondary | ICD-10-CM | POA: Diagnosis present

## 2020-12-23 DIAGNOSIS — J9611 Chronic respiratory failure with hypoxia: Secondary | ICD-10-CM | POA: Diagnosis not present

## 2020-12-23 DIAGNOSIS — R1310 Dysphagia, unspecified: Secondary | ICD-10-CM | POA: Diagnosis not present

## 2020-12-23 DIAGNOSIS — Q87418 Marfan's syndrome with other cardiovascular manifestations: Secondary | ICD-10-CM | POA: Diagnosis not present

## 2020-12-23 DIAGNOSIS — J90 Pleural effusion, not elsewhere classified: Secondary | ICD-10-CM | POA: Diagnosis not present

## 2020-12-23 DIAGNOSIS — I330 Acute and subacute infective endocarditis: Secondary | ICD-10-CM

## 2020-12-23 DIAGNOSIS — R0601 Orthopnea: Secondary | ICD-10-CM

## 2020-12-23 DIAGNOSIS — Q8740 Marfan's syndrome, unspecified: Secondary | ICD-10-CM | POA: Diagnosis not present

## 2020-12-23 DIAGNOSIS — D62 Acute posthemorrhagic anemia: Secondary | ICD-10-CM | POA: Diagnosis present

## 2020-12-23 DIAGNOSIS — E43 Unspecified severe protein-calorie malnutrition: Secondary | ICD-10-CM | POA: Diagnosis present

## 2020-12-23 DIAGNOSIS — Z79899 Other long term (current) drug therapy: Secondary | ICD-10-CM | POA: Diagnosis not present

## 2020-12-23 DIAGNOSIS — R0602 Shortness of breath: Secondary | ICD-10-CM

## 2020-12-23 DIAGNOSIS — I4891 Unspecified atrial fibrillation: Secondary | ICD-10-CM | POA: Diagnosis present

## 2020-12-23 DIAGNOSIS — J811 Chronic pulmonary edema: Secondary | ICD-10-CM | POA: Diagnosis not present

## 2020-12-23 DIAGNOSIS — Z952 Presence of prosthetic heart valve: Secondary | ICD-10-CM

## 2020-12-23 DIAGNOSIS — I4821 Permanent atrial fibrillation: Secondary | ICD-10-CM | POA: Diagnosis not present

## 2020-12-23 DIAGNOSIS — Z681 Body mass index (BMI) 19 or less, adult: Secondary | ICD-10-CM

## 2020-12-23 DIAGNOSIS — J9620 Acute and chronic respiratory failure, unspecified whether with hypoxia or hypercapnia: Secondary | ICD-10-CM | POA: Diagnosis not present

## 2020-12-23 DIAGNOSIS — I361 Nonrheumatic tricuspid (valve) insufficiency: Secondary | ICD-10-CM | POA: Diagnosis not present

## 2020-12-23 DIAGNOSIS — J9811 Atelectasis: Secondary | ICD-10-CM | POA: Diagnosis not present

## 2020-12-23 DIAGNOSIS — R5381 Other malaise: Principal | ICD-10-CM | POA: Diagnosis present

## 2020-12-23 DIAGNOSIS — Z8279 Family history of other congenital malformations, deformations and chromosomal abnormalities: Secondary | ICD-10-CM

## 2020-12-23 DIAGNOSIS — E46 Unspecified protein-calorie malnutrition: Secondary | ICD-10-CM | POA: Diagnosis not present

## 2020-12-23 DIAGNOSIS — L8990 Pressure ulcer of unspecified site, unspecified stage: Secondary | ICD-10-CM | POA: Insufficient documentation

## 2020-12-23 LAB — PROTIME-INR
INR: 2.6 — ABNORMAL HIGH (ref 0.8–1.2)
Prothrombin Time: 28.1 seconds — ABNORMAL HIGH (ref 11.4–15.2)

## 2020-12-23 MED ORDER — DILTIAZEM HCL 60 MG PO TABS
30.0000 mg | ORAL_TABLET | Freq: Four times a day (QID) | ORAL | Status: DC
  Administered 2020-12-24 – 2021-01-10 (×67): 30 mg via ORAL
  Filled 2020-12-23 (×72): qty 1

## 2020-12-23 MED ORDER — ACETAMINOPHEN 325 MG PO TABS
325.0000 mg | ORAL_TABLET | ORAL | Status: DC | PRN
  Administered 2020-12-25 – 2021-01-02 (×5): 650 mg via ORAL
  Filled 2020-12-23 (×6): qty 2

## 2020-12-23 MED ORDER — PANTOPRAZOLE SODIUM 40 MG PO TBEC
40.0000 mg | DELAYED_RELEASE_TABLET | Freq: Every day | ORAL | Status: DC
  Administered 2020-12-24 – 2021-01-10 (×18): 40 mg via ORAL
  Filled 2020-12-23 (×18): qty 1

## 2020-12-23 MED ORDER — WARFARIN - PHARMACIST DOSING INPATIENT: Freq: Every day | Status: DC

## 2020-12-23 MED ORDER — ASPIRIN EC 81 MG PO TBEC
81.0000 mg | DELAYED_RELEASE_TABLET | Freq: Every day | ORAL | Status: DC
  Administered 2020-12-24 – 2021-01-10 (×18): 81 mg via ORAL
  Filled 2020-12-23 (×18): qty 1

## 2020-12-23 MED ORDER — BUPROPION HCL ER (XL) 150 MG PO TB24
150.0000 mg | ORAL_TABLET | Freq: Every day | ORAL | Status: DC
  Administered 2020-12-24 – 2021-01-10 (×18): 150 mg via ORAL
  Filled 2020-12-23 (×19): qty 1

## 2020-12-23 MED ORDER — SERTRALINE HCL 50 MG PO TABS
25.0000 mg | ORAL_TABLET | Freq: Every day | ORAL | Status: DC
  Administered 2020-12-24 – 2021-01-10 (×18): 25 mg via ORAL
  Filled 2020-12-23 (×18): qty 1

## 2020-12-23 MED ORDER — MELATONIN 5 MG PO TABS
5.0000 mg | ORAL_TABLET | Freq: Every day | ORAL | Status: DC
  Administered 2020-12-23 – 2021-01-09 (×17): 5 mg via ORAL
  Filled 2020-12-23 (×18): qty 1

## 2020-12-23 MED ORDER — THIAMINE HCL 100 MG PO TABS
100.0000 mg | ORAL_TABLET | Freq: Every day | ORAL | Status: DC
  Administered 2020-12-24 – 2021-01-10 (×18): 100 mg via ORAL
  Filled 2020-12-23 (×18): qty 1

## 2020-12-23 MED ORDER — WARFARIN SODIUM 5 MG PO TABS
5.0000 mg | ORAL_TABLET | Freq: Once | ORAL | Status: AC
  Administered 2020-12-23: 5 mg via ORAL
  Filled 2020-12-23: qty 1

## 2020-12-23 MED ORDER — ASCORBIC ACID 500 MG PO TABS
500.0000 mg | ORAL_TABLET | Freq: Every day | ORAL | Status: DC
  Administered 2020-12-24 – 2021-01-10 (×18): 500 mg via ORAL
  Filled 2020-12-23 (×18): qty 1

## 2020-12-23 MED ORDER — AMIODARONE HCL 200 MG PO TABS
200.0000 mg | ORAL_TABLET | Freq: Every day | ORAL | Status: DC
  Administered 2020-12-24 – 2021-01-10 (×18): 200 mg via ORAL
  Filled 2020-12-23 (×18): qty 1

## 2020-12-23 MED ORDER — HYDROCODONE-ACETAMINOPHEN 5-325 MG PO TABS: 1.0000 | ORAL_TABLET | Freq: Four times a day (QID) | ORAL | Status: DC | PRN

## 2020-12-23 MED ORDER — LOSARTAN POTASSIUM 50 MG PO TABS
25.0000 mg | ORAL_TABLET | Freq: Every day | ORAL | Status: DC
  Administered 2020-12-24 – 2020-12-29 (×6): 25 mg via ORAL
  Filled 2020-12-23 (×6): qty 1

## 2020-12-23 MED ORDER — ADULT MULTIVITAMIN W/MINERALS CH
1.0000 | ORAL_TABLET | Freq: Every day | ORAL | Status: DC
  Administered 2020-12-24 – 2021-01-10 (×18): 1 via ORAL
  Filled 2020-12-23 (×18): qty 1

## 2020-12-23 MED ORDER — ALPRAZOLAM 0.25 MG PO TABS
0.2500 mg | ORAL_TABLET | Freq: Two times a day (BID) | ORAL | Status: DC | PRN
  Administered 2020-12-29 – 2020-12-30 (×2): 0.25 mg via ORAL
  Filled 2020-12-23 (×2): qty 1

## 2020-12-23 MED ORDER — METOPROLOL TARTRATE 25 MG PO TABS
25.0000 mg | ORAL_TABLET | Freq: Three times a day (TID) | ORAL | Status: DC
  Administered 2020-12-23 – 2021-01-10 (×49): 25 mg via ORAL
  Filled 2020-12-23 (×51): qty 1

## 2020-12-23 MED ORDER — FERROUS SULFATE 325 (65 FE) MG PO TABS
325.0000 mg | ORAL_TABLET | Freq: Every day | ORAL | Status: DC
  Administered 2020-12-24 – 2020-12-25 (×2): 325 mg via ORAL
  Filled 2020-12-23 (×3): qty 1

## 2020-12-23 MED ORDER — ENSURE ENLIVE PO LIQD
237.0000 mL | Freq: Two times a day (BID) | ORAL | Status: DC
  Administered 2020-12-24 – 2021-01-10 (×29): 237 mL via ORAL

## 2020-12-23 NOTE — Progress Notes (Signed)
Inpatient Rehabilitation  Patient information reviewed and entered into eRehab system by Euphemia Lingerfelt M. Paul Trettin, M.A., CCC/SLP, PPS Coordinator.  Information including medical coding, functional ability and quality indicators will be reviewed and updated through discharge.    

## 2020-12-23 NOTE — Progress Notes (Signed)
Patient ID: Jennifer [name], female   DOB: 1976-07-22, 45 y.o.   MRN: [id number]  Met with patient, introduced myself, and explained my role in her care. Explained the rehab process, rehab schedule, and the team conference process. Completed skin assessment with the LPN, Rayne Du. Gave the patient additional educational information on the DASH diet, cooking with less salt, and heart healthy diet. I will continue to monitor her progress while on rehab.  Dorthula Nettles, RN3, BSN, CBIS, [place], [facility], Inpatient Rehabilitation Office [phone] Cell [phone]

## 2020-12-23 NOTE — Progress Notes (Signed)
Inpatient Rehabilitation Medication Review by a Pharmacist  A complete drug regimen review was completed for this patient to identify any potential clinically significant medication issues.  Clinically significant medication issues were identified:  yes   Type of Medication Issue Identified Description of Issue Urgent (address now) Non-Urgent (address on AM team rounds) Plan Plan Accepted by Provider? (Yes / No / Pending AM Rounds)  Drug Interaction(s) (clinically significant)       Duplicate Therapy       Allergy       No Medication Administration End Date       Incorrect Dose       Additional Drug Therapy Needed  Senna-s missing, note states it was to start tonight, no order placed Non-urgent   Pending AM rounds  Other         Name of provider notified for urgent issues identified:   Provider Method of Notification:    For non-urgent medication issues to be resolved on team rounds tomorrow morning a [facility] Secure Chat Handoff was sent to:    Pharmacist comments:   Time spent performing this drug regimen review (minutes):  20   Jennifer [name] 12/23/2020 5:28 PM

## 2020-12-23 NOTE — H&P (Addendum)
Physical Medicine and Rehabilitation Admission H&P       HPI: Jennifer [name] is a 45 year old right-handed female with history of congenital pectus excavatum, Marfan syndrome followed by cardiology services Dr.Lisi at [facility], mitral valve prolapse and known mild to moderate mitral regurgitation.  Per chart review she lives with her spouse in [place].  1 level home 12 steps to entry.  Husband works during the day.  Good supportive family.  She does have 3 adult aged children at home.  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  On arrival to the ED she was tachycardic 110 blood pressure 110s over 90, SPO2 90% on room air.  Hemoglobin 9.3.  EKG demonstrated sinus tachycardia right bundle branch block.  CTA chest no pulmonary emboli however findings compatible fluid overload including cardiomegaly and small pericardial effusion small bilateral pleural effusion pulmonary interstitial edema.  CT abdomen pelvis with contrast demonstrated small volume ascites.  Patient did undergo diuresis.  Placed on Lovenox for DVT prophylaxis.  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  Coumadin therapy was initiated.  Sternal precautions as directed.  She did require a right chest tube for a short time removed 12/18/2020 as well as did undergo a thoracentesis for pleural effusion.Marland Kitchen  She was weaned from oxygen therapy.  Hospital course again complicated by bouts of atrial fibrillation maintained on amiodarone advised to continue Coumadin with latest INR 2.6.  Hospital course anemia 8.6 and monitored.  Her diet was slowly advanced to regular  consistency.  She was transferred to [facility] 11/25/2020 for ongoing therapies.  Therapy evaluation completed due to patient decreased functional mobility was admitted for a comprehensive rehab program.   Review of Systems  Constitutional: Positive for malaise/fatigue. Negative for chills and fever.  HENT: Negative for hearing loss.   Eyes: Negative for blurred vision and double vision.  Respiratory: Positive for shortness of breath. Negative for cough and wheezing.   Cardiovascular: Positive for leg swelling. Negative for chest pain and palpitations.  Gastrointestinal: Positive for constipation. Negative for heartburn, nausea and vomiting.  Genitourinary: Negative for dysuria.  Musculoskeletal: Positive for joint pain and myalgias.  Skin: Negative for rash.  Psychiatric/Behavioral: Positive for depression. The patient has insomnia.   All other systems reviewed and are negative.       Past Medical History:  Diagnosis Date  . Marfan syndrome           Past Surgical History:  Procedure Laterality Date  . BUBBLE STUDY   12/09/2020    Procedure: BUBBLE STUDY;  Surgeon: Dixie Dials, MD;  Location: [place];  Service: Cardiovascular;;  . HERNIA REPAIR      . IR THORACENTESIS ASP PLEURAL SPACE W/IMG GUIDE   11/27/2020  . TEE WITHOUT CARDIOVERSION N/A 12/09/2020    Procedure: TRANSESOPHAGEAL ECHOCARDIOGRAM (TEE);  Surgeon: Dixie Dials, MD;  Location: [facility] ENDOSCOPY;  Service: Cardiovascular;  Laterality: N/A;         Family History  Problem Relation Age of Onset  . Marfan syndrome Father    . Hepatitis C Father      Social History:  reports that she has  never smoked. She has never used smokeless tobacco. She reports current alcohol use. She reports that she does not use drugs. Allergies: No Known Allergies       Medications Prior to Admission  Medication Sig Dispense Refill  . atenolol (TENORMIN) 25 MG tablet Take 1 tablet by mouth daily.      Marland Kitchen atenolol (TENORMIN)  25 MG tablet TAKE 1 AND 1/2 TABLETS BY MOUTH DAILY 135 tablet 2  . doxycycline (VIBRA-TABS) 100 MG tablet Take 100 mg by mouth 2 (two) times daily.      Marland Kitchen losartan (COZAAR) 50 MG tablet Take 50 mg by mouth 2 (two) times daily.      Marland Kitchen losartan (COZAAR) 50 MG tablet TAKE 2 TABLETS BY MOUTH ONCE A DAY 60 tablet 0  . losartan (COZAAR) 50 MG tablet TAKE 2 TABLETS (100 MG) BY MOUTH DAILY 480 tablet 0      Drug Regimen Review Drug regimen was reviewed and remains appropriate with no significant issues identified   Home: Home Living Living Arrangements: Spouse/significant other,Children Available Help at Discharge: Family,Friend(s),Available 24 hours/day (family and friends will arrange 24/7 supervision) Type of Home: House Home Access: Stairs to enter Technical brewer of Steps: 12 Entrance Stairs-Rails: Right,Left,Can reach both Home Layout: Two level,Laundry or work area in [employer] on main level (her home office is in the finished basement) Bathroom Shower/Tub: Tub/shower unit,Door Biochemist, clinical: Handicapped height Bathroom Accessibility: Yes  Lives With: Spouse,Family   Functional History: Prior Function Level of Independence: Independent Independent prior to admission working IT at [employer] Status:  Mobility: Min mod assist for mobility   ADL: Min mod assist ADLs   Cognition: Cognition Orientation Level: Oriented X4   Physical Exam: Blood pressure 98/69, pulse (!) 114, temperature 97.9 F (36.6 C), temperature source Oral, resp. rate 18, height 5\' 10"  (1.778 m), weight 65.8 kg, SpO2 99 %. Physical Exam Constitutional:      General: She is not in acute distress.    Comments: Frail appearing  HENT:     Head: Normocephalic.     Right Ear: External ear normal.     Left Ear: External ear normal.     Nose: Nose normal.     Comments: O2 [place]    Mouth/Throat:     Mouth: Mucous membranes are moist.  Eyes:     Extraocular Movements:  Extraocular movements intact.     Conjunctiva/sclera: Conjunctivae normal.     Pupils: Pupils are equal, round, and reactive to light.  Cardiovascular:     Rate and Rhythm: Tachycardia present.     Heart sounds: Murmur heard.     Pulmonary:     Effort: Pulmonary effort is normal. No respiratory distress.     Breath sounds: No wheezing.  Abdominal:     General: Abdomen is flat. Bowel sounds are normal. There is no distension.     Tenderness: There is no abdominal tenderness.  Musculoskeletal:        General: No swelling or tenderness.     Cervical back: Normal range of motion.     Right lower leg: No edema.     Left lower leg: No edema.  Skin:    General: Skin is warm.     Comments: Chest incision well-healed with steristrips, sacrum with mild stage II Neurological:     Comments: Alert and oriented x 3. Normal insight and awareness. Intact Memory. Normal language and speech. Cranial nerve exam unremarkable, UE motor 4-/5  prox to 4/5 distally. LE: 3/5 HF, KE and 4/5 ADF/PF. No sensory findings. Normal tone  Psychiatric:        Mood and Affect: Mood normal.        Behavior: Behavior normal.        Lab Results Last 48 Hours        Results for orders placed or performed during the hospital encounter of 11/25/20 (from the past 48 hour(s))  Protime-INR     Status: Abnormal    Collection Time: 12/22/20  4:03 AM  Result Value Ref Range    Prothrombin Time 32.7 (H) 11.4 - 15.2 seconds    INR 3.2 (H) 0.8 - 1.2      Comment: (NOTE) INR goal varies based on device and disease states. Performed at [facility], [place] [address], [place], [place] [address]    Protime-INR     Status: Abnormal    Collection Time: 12/23/20  7:40 AM  Result Value Ref Range    Prothrombin Time 28.1 (H) 11.4 - 15.2 seconds    INR 2.6 (H) 0.8 - 1.2      Comment: (NOTE) INR goal varies based on device and disease states. Performed at [facility], [place] [address] [place], [place] [address]         Imaging Results (Last 48 hours)  No results found.           Medical Problem List and Plan: 1.  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  Sternal precautions.             -patient may shower             -ELOS/Goals: 10-14 days, supervision with PT and supervision to min assist with OT 2.  Antithrombotics: -DVT/anticoagulation: Chronic Coumadin             -dosing adjustments per pharmacy protocol             -antiplatelet therapy: Aspirin 81 mg daily 3. Pain Management: Hydrocodone as needed 4. Mood: Zoloft 25 mg daily, Wellbutrin 150 mg daily, melatonin 6 mg nightly Xanax as needed             -antipsychotic agents: N/A 5. Neuropsych: This patient is capable of making decisions on her own behalf. 6. Skin/Wound Care: Routine skin checks  -foam dressing to sacrum  -encourage appropriate nutritional intake, pressure relief 7. Fluids/Electrolytes/Nutrition: Routine in and outs with follow-up chemistries on admit             -encourage appropriate PO intake 8.  Acute blood loss anemia.  Follow-up CBC.  Continue iron supplement 9.  Atrial fibrillation.  Cardizem 30 mg every 6 hours, metoprolol 25 mg 3 times daily, losartan 25 mg daily, amiodarone 200 mg daily             -pt in sinus tachycardia in 110's when I saw her today             -adjust chronotropics as needed based on activity tolerance             -cards f/u as needed 10. Mild constipation (since coming off abx)             -add senna-s at bedtime           Cathlyn Parsons, PA-C 12/23/2020  I have personally performed a face to face diagnostic evaluation of this patient  and formulated the key components of the plan.  Additionally, I have personally reviewed laboratory data, imaging studies, as well as relevant notes and concur with the physician assistant's documentation above.  The patient's status has not changed from the original H&P.   Any changes in documentation from the acute care chart have been noted above.  Meredith Staggers, MD, Mell[facility] Drown

## 2020-12-23 NOTE — H&P (Signed)
Physical Medicine and Rehabilitation Admission H&P     HPI: Jennifer [name] is a 45 year old right-handed female with history of congenital pectus excavatum, Marfan syndrome followed by cardiology services Dr[facility]Lisi at [facility], mitral valve prolapse and known mild to moderate mitral regurgitation.  Per chart review she lives with her spouse in [place].  1 level home 12 steps to entry.  Husband works during the day.  Good supportive family.  She does have 3 adult aged children at home.  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  On arrival to the ED she was tachycardic 110 blood pressure 110s over 90, SPO2 90% on room air.  Hemoglobin 9.3.  EKG demonstrated sinus tachycardia right bundle branch block.  CTA chest no pulmonary emboli however findings compatible fluid overload including cardiomegaly and small pericardial effusion small bilateral pleural effusion pulmonary interstitial edema.  CT abdomen pelvis with contrast demonstrated small volume ascites.  Patient did undergo diuresis.  Placed on Lovenox for DVT prophylaxis.  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  Coumadin therapy was initiated.  Sternal precautions as directed.  She did require a right chest tube for a short time removed 12/18/2020 as well as did undergo a thoracentesis for pleural effusion.Marland Kitchen  She was weaned from oxygen therapy.  Hospital course again complicated by bouts of atrial fibrillation maintained on amiodarone advised to continue Coumadin with latest INR 2.6.  Hospital course anemia 8.6 and monitored.  Her diet was slowly advanced to regular  consistency.  She was transferred to [facility] 11/25/2020 for ongoing therapies.  Therapy evaluation completed due to patient decreased functional mobility was admitted for a comprehensive rehab program.  Review of Systems  Constitutional: Positive for malaise/fatigue. Negative for chills and fever.  HENT: Negative for hearing loss.   Eyes: Negative for blurred vision and double vision.  Respiratory: Positive for shortness of breath. Negative for cough and wheezing.   Cardiovascular: Positive for leg swelling. Negative for chest pain and palpitations.  Gastrointestinal: Positive for constipation. Negative for heartburn, nausea and vomiting.  Genitourinary: Negative for dysuria.  Musculoskeletal: Positive for joint pain and myalgias.  Skin: Negative for rash.  Psychiatric/Behavioral: Positive for depression. The patient has insomnia.   All other systems reviewed and are negative.  Past Medical History:  Diagnosis Date  . Marfan syndrome    Past Surgical History:  Procedure Laterality Date  . BUBBLE STUDY  12/09/2020   Procedure: BUBBLE STUDY;  Surgeon: Dixie Dials, MD;  Location: [place];  Service: Cardiovascular;;  . HERNIA REPAIR    . IR THORACENTESIS ASP PLEURAL SPACE W/IMG GUIDE  11/27/2020  . TEE WITHOUT CARDIOVERSION N/A 12/09/2020   Procedure: TRANSESOPHAGEAL ECHOCARDIOGRAM (TEE);  Surgeon: Dixie Dials, MD;  Location: [facility] ENDOSCOPY;  Service: Cardiovascular;  Laterality: N/A;   Family History  Problem Relation Age of Onset  . Marfan syndrome Father   . Hepatitis C Father    Social History:  reports that she has never smoked. She has never used smokeless tobacco. She reports current alcohol use. She reports that she does not use drugs. Allergies: No Known Allergies Medications Prior to Admission  Medication Sig  Dispense Refill  . atenolol (TENORMIN) 25 MG tablet Take 1 tablet by mouth daily.    Marland Kitchen atenolol (TENORMIN) 25 MG tablet TAKE 1 AND 1/2 TABLETS BY  MOUTH DAILY 135 tablet 2  . doxycycline (VIBRA-TABS) 100 MG tablet Take 100 mg by mouth 2 (two) times daily.    Marland Kitchen losartan (COZAAR) 50 MG tablet Take 50 mg by mouth 2 (two) times daily.    Marland Kitchen losartan (COZAAR) 50 MG tablet TAKE 2 TABLETS BY MOUTH ONCE A DAY 60 tablet 0  . losartan (COZAAR) 50 MG tablet TAKE 2 TABLETS (100 MG) BY MOUTH DAILY 480 tablet 0    Drug Regimen Review Drug regimen was reviewed and remains appropriate with no significant issues identified  Home: Home Living Living Arrangements: Spouse/significant other,Children Available Help at Discharge: Family,Friend(s),Available 24 hours/day (family and friends will arrange 24/7 supervision) Type of Home: House Home Access: Stairs to enter Technical brewer of Steps: 12 Entrance Stairs-Rails: Right,Left,Can reach both Home Layout: Two level,Laundry or work area in [employer] on main level (her home office is in the finished basement) Bathroom Shower/Tub: Tub/shower unit,Door Biochemist, clinical: Handicapped height Bathroom Accessibility: Yes  Lives With: Spouse,Family   Functional History: Prior Function Level of Independence: Independent Independent prior to admission working IT at [employer] Status:  Mobility: Min mod assist for mobility          ADL: Min mod assist ADLs    Cognition: Cognition Orientation Level: Oriented X4    Physical Exam: Blood pressure 98/69, pulse (!) 114, temperature 97.9 F (36.6 C), temperature source Oral, resp. rate 18, height 5\' 10"  (1.778 m), weight 65.8 kg, SpO2 99 %. Physical Exam Constitutional:      General: She is not in acute distress.    Comments: Frail appearing  HENT:     Head: Normocephalic.     Right Ear: External ear normal.     Left Ear: External ear normal.     Nose: Nose normal.     Comments: O2 [place]    Mouth/Throat:     Mouth: Mucous membranes are moist.  Eyes:     Extraocular Movements: Extraocular movements intact.      Conjunctiva/sclera: Conjunctivae normal.     Pupils: Pupils are equal, round, and reactive to light.  Cardiovascular:     Rate and Rhythm: Tachycardia present.     Heart sounds: Murmur heard.    Pulmonary:     Effort: Pulmonary effort is normal. No respiratory distress.     Breath sounds: No wheezing.  Abdominal:     General: Abdomen is flat. Bowel sounds are normal. There is no distension.     Tenderness: There is no abdominal tenderness.  Musculoskeletal:        General: No swelling or tenderness.     Cervical back: Normal range of motion.     Right lower leg: No edema.     Left lower leg: No edema.  Skin:    General: Skin is warm.     Comments: Chest incision well-healed with steristrips  Neurological:     Comments: Alert and oriented x 3. Normal insight and awareness. Intact Memory. Normal language and speech. Cranial nerve exam unremarkable, UE motor 4-/5 prox to 4/5 distally. LE: 3/5 HF, KE and 4/5 ADF/PF. No sensory findings. Normal tone  Psychiatric:        Mood and Affect: Mood normal.        Behavior: Behavior normal.  Results for orders placed or performed during the hospital encounter of 11/25/20 (from the past 48 hour(s))  Protime-INR     Status: Abnormal   Collection Time: 12/22/20  4:03 AM  Result Value Ref Range   Prothrombin Time 32.7 (H) 11.4 - 15.2 seconds   INR 3.2 (H) 0.8 - 1.2    Comment: (NOTE) INR goal varies based on device and disease states. Performed at [facility], [place] [address], [place], [place] [address]   Protime-INR     Status: Abnormal   Collection Time: 12/23/20  7:40 AM  Result Value Ref Range   Prothrombin Time 28.1 (H) 11.4 - 15.2 seconds   INR 2.6 (H) 0.8 - 1.2    Comment: (NOTE) INR goal varies based on device and disease states. Performed at [facility], [place] [address], [place], [place] [address]    No results found.     Medical Problem List and Plan: 1.  Debility secondary to endocarditis/valve  prolapse.  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  Sternal precautions.  -patient may shower  -ELOS/Goals: 10-14 days, supervision with PT and supervision to min assist with OT 2.  Antithrombotics: -DVT/anticoagulation: Chronic Coumadin  -dosing adjustments per pharmacy protocol  -antiplatelet therapy: Aspirin 81 mg daily 3. Pain Management: Hydrocodone as needed 4. Mood: Zoloft 25 mg daily, Wellbutrin 150 mg daily, melatonin 6 mg nightly Xanax as needed  -antipsychotic agents: N/A 5. Neuropsych: This patient is capable of making decisions on her own behalf. 6. Skin/Wound Care: Routine skin checks 7. Fluids/Electrolytes/Nutrition: Routine in and outs with follow-up chemistries on admit  -encourage appropriate PO intake 8.  Acute blood loss anemia.  Follow-up CBC.  Continue iron supplement 9.  Atrial fibrillation.  Cardizem 30 mg every 6 hours, metoprolol 25 mg 3 times daily, losartan 25 mg daily, amiodarone 200 mg daily  -pt in sinus tachycardia in 110's when I saw her today  -adjust chronotropics as needed based on activity tolerance  -cards f/u as needed 10. Mild constipation (since coming off abx)  -add senna-s at bedtime      Cathlyn Parsons, PA-C 12/23/2020

## 2020-12-23 NOTE — Progress Notes (Signed)
Patient arrived on unit, oriented to unit. Reviewed medications, therapy schedule, rehab routine and plan of care. States an understanding of information reviewed. No complications noted at this time. Patient reports no pain and is AX4 Jennifer [name] L Evanne Matsunaga  

## 2020-12-23 NOTE — Progress Notes (Signed)
Meredith Staggers, MD  Physician  Physical Medicine and Rehabilitation  PMR Pre-admission     Signed  Date of Service:  12/19/2020  2:19 PM      Related encounter: Admission (Current) from 11/25/2020 in [place] all _0 Manual_1 Template_2 Copied  Added by: _3 Cristina Gong, RN_4 Meredith Staggers, MD   _5 Hover for details  PMR Admission Coordinator Pre-Admission Assessment   Patient: Jennifer [name] is an 45 y.o., female MRN: [id number] DOB: 1976/02/23 Height: _6  (1.778 m) Weight: 65.8 kg   Insurance Information HMO:     PPO:      PCP:      IPA:      80/20:      OTHER:  PRIMARYIval Bible      Policy#: [id number]      Subscriber: pt CM Name: Jackelyn Poling      Phone#: [phone]     Fax#: [phone] Pre-Cert#: [id number] approved for 7 days      Employer: [employer] Benefits:  Phone #: [phone]     Name: 5/18 Eff. Date: 08/03/2017     Deduct: $300      Out of Pocket Max: $7900 CIR: 80%      SNF: 80% limited to 120 days per year Outpatient: $20      Co-Pay: visits per medical neccesity Home Health: 80%      Co-Pay: visits per medical neccesity DME: 80%     Co-Pay: 20% Providers: in network  SECONDARY: none         Financial Counselor:       Phone#:    [employer]" for patients in Inpatient Rehabilitation Facilities with attached "Privacy Act [place] Records" was provided and verbally reviewed with: N/A   Emergency Contact Information         Contact Information     Name Relation Home Work [place] [phone]         Current Medical History  Patient Admitting Diagnosis: Debility   History of Present Illness: 45 year old female with previous medical history of Marfan syndrome, mitral valve prolapse and known mild to moderate regurg . Has been reportedly on atenolol since she was a teenager and been on  losartan for a few years. Presented to [facility] ED on 10/19/2020 due to worsening shortness of breath. This has been noted to be progressive and dyspnea on exertion over several months, but worsened for two weeks, with abrupt onset of bilateral lower extremity swelling up to the thighs. Outpatient workup by hematology, gastroenterology and rheumatology without definitive conclusion.    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 Returned to OR on 11/02/20 for reexploration with mediastinal washout, complex chest wall reconstruction with stabilization of the sternum with Recon Talon plate and a ladder plate. Modified Ravitch procedure. Chest wall reconstruction using Strattice mesh 10 x 10 cm. Closure of sternotomy. Again returned to OR 11/04/20 for Impella placement and placement of right chest tube.. Extubated 4/8 and Impella removed 11/12/20. Transferred to floor from ICU on 11/23/20. Penicillin G daily for total of 6 weeks with end date of 12/12/2020. Warfarin with INR goals of  3.0-3.5 for mechanical mitral valve. Recommended lifelong antibiotic prophylaxis of Amoxicillin 2 g 60 min before dental cleaning/procedures. Postoperative atrial fibrillation on amiodarone and coumadin .    Chest tube clamp trial 4/25 with small increase in right apical PTX and new development of tiny right basilar PTX. Felt patient likely to have a space at least initially due to the severity of her pectus excavatum and the difference in chest cavity volume after her repair. Had stage 2-3 sacral decubitus to lower back. Recommended to clean with soap an water. apply baz and barrier cream to sacrum with santyl in lower sacrum to open dark area then cover with sacral meplix 2 times  per day. On nocturnal tube feeding of Osmolite due to decreased po intake.    Admitted to [facility] on 11/25/20 for ongoing rehabilitation and wound care with chest tube. Chest tube eventually removed on 12/18/20 with follow up chest x ray with no evidence of pneumothorax. Latest INR 3.3 on Coumadin. Stable on O2 via [place] up to 0.5 to 1 liters and maintaining good saturations. Does get SOB with activity. CXR 5/13 showed possible right and left pleural effusions . IR consulted and [place] of left chest did not show any pleural fluid amenable to percutaneous aspiration. [place] of right chest showed small amount of pleural fluid which was potentially amenable to thoracentesis however with increased risk with chest tube in place, thoracentesis was not pursued. Chronic atrial fibrillation with need for continued heart rate monitoring. Protein calorie malnutrition now off nocturnal feeds and to continue with heart healthy diet with dietician follow up. Generalized weakness due to prolonged hospitalization with need for continued PT and OT prior to discharge home.   Patient's medical record from [facility] has been reviewed by the rehabilitation admission coordinator and physician.   Past Medical History      Past Medical History:  Diagnosis Date  . Marfan syndrome        Family History   family history includes Hepatitis C in her father; Marfan syndrome in her father.   Prior Rehab/Hospitalizations Has the patient had prior rehab or hospitalizations prior to admission? Yes   Has the patient had major surgery during 100 days prior to admission? Yes              Current Medications Admelog Amiodarone ASA Bupropion Digestive advantage caps Diltiazem HCL Dronabinol Ferrous sulfate Fluconazole Wafarin Lidoderm patch Losartan potassium Melatonin Metoprolol tartrate Pantoprazole sodium Pro-stat Sertraline HCL Simethicone Tb a Vite Thiamine HCL Vitamin C   Patients Current Diet:  Diet  Heart Health Diet   Precautions / Restrictions Precautions: Sternal; Fall   Has the patient had 2 or more falls or a fall with injury in the past year? 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 Continued to work, but has been remote due to the pandemic   Prior Functional Level Self Care: Did the patient need help bathing, dressing, using the toilet or eating? Independent   Indoor Mobility: Did the patient need assistance with walking from room to room (with or without device)? Independent   Stairs: Did the patient need assistance with internal or external stairs (with or without device)? Independent   Functional Cognition: Did the patient need help planning regular tasks such as shopping or remembering  to take medications? Independent   Home Assistive Devices / Equipment None     Prior Device Use: Indicate devices/aids used by the patient prior to current illness, exacerbation or injury? None of the above     Prior Functional Level Current Functional Level  Bed Mobility   Independent Min assist    Transfers   Independent   Min assist; Mod assist transfer just couple of steps to chair. Gets SOB with activity. Using 0.5 liters [place] O2. HR 117 to 127 with activity and at rest. 5/20 ambulated 10 feet with min to mod assist    Mobility - Walk/Wheelchair   Independent   Mod assist    Upper Body Dressing   Independent   Mod assist    Lower Body Dressing   Independent   Max assist    Grooming   Independent   Min assist    Eating/Drinking   Independent   Mod Independent    Toilet Transfer   Independent   Mod assist    Bladder Continence    continent   use of purewick external device    Bowel Management   continent   continent    Stair Climbing   Independent    Other (not attempted)    Communication   independent   independent    Memory   intact intact      Special Needs/ Care Considerations O2 at 0.5 to 1 liters [place]. Not on home oxygen   Previous Home Environment  Living Arrangements: Spouse/significant other; Children  Lives With: Spouse; Family Available Help at Discharge: Family; Friend(s); Available 24 hours/day (family and friends will arrange 24/7 supervision) Type of Home: House Home Layout: Two level; Laundry or work area in basement; Full bath on main level (her home office is in the finished basement) Home Access: Stairs to enter Entrance Stairs-Rails: Right; Left; Can reach both Entrance Stairs-Number of Steps: 12 Bathroom Shower/Tub: Tub/shower unit; Door [employer] Toilet: Handicapped height Bathroom Accessibility: Yes How Accessible: Accessible via walker Home Care Services: No   Discharge Living Setting Plans for Discharge Living Setting: Patient's home; Lives with (comment) (spouse and children, 3) Type of Home at Discharge: House Discharge Home Layout: Two level; Laundry or work area in basement; Full bath on main level; Able to live on main level with bedroom/bathroom Discharge Home Access: Stairs to enter Entrance Stairs-Rails: Right; Left; Can reach both Entrance Stairs-Number of Steps: 12 Discharge Bathroom Shower/Tub: Tub/shower unit; Door Discharge Bathroom Toilet: Handicapped height Discharge Bathroom Accessibility: Yes How Accessible: Accessible via walker Does the patient have any problems obtaining your medications?: No   Social/Family/Support Systems Patient Roles: Spouse; Parent ([employer] employee, IT department working remote) Contact Information: spouse, Legrand Como Anticipated Caregiver: spouse and various family members Anticipated Caregiver's Contact Information: [phone] Ability/Limitations of Caregiver: spouse works beginning at 5 pm, various other family memebrs can provide 24/7  supervision Caregiver Availability: 24/7 Discharge Plan Discussed with Primary Caregiver: Yes Is Caregiver In Agreement with Plan?: Yes Does Caregiver/Family have Issues with Lodging/Transportation while Pt is in Rehab?: No   Goals Patient/Family Goal for Rehab: supervision PT, supervision to min OT Expected length of stay: ELOS 10 to 14 days Pt/Family Agrees to Admission and willing to participate: Yes Program Orientation Provided & Reviewed with Pt/Caregiver Including Roles  & Responsibilities: Yes   Decrease burden of Care through IP rehab admission: n/a   Possible need for SNF placement upon discharge: not anticipated   Patient Condition: I have reviewed medical records from  [facility], spoken with CSW, and patient. I met with patient at the bedside for inpatient rehabilitation assessment.  Patient will benefit from ongoing PT and OT, can actively participate in 3 hours of therapy a day 5 days of the week, and can make measurable gains during the admission.  Patient will also benefit from the coordinated team approach during an Inpatient Acute Rehabilitation admission.  The patient will receive intensive therapy as well as Rehabilitation physician, nursing, social worker, and care management interventions.  Due to bladder management, bowel management, safety, skin/wound care, disease management, medication administration, pain management and patient education the patient requires 24 hour a day rehabilitation nursing.  The patient is currently mod assist overall with mobility and basic ADLs.  Discharge setting and therapy post discharge at home with home health is anticipated.  Patient has agreed to participate in the Acute Inpatient Rehabilitation Program and will admit today.   Preadmission Screen Completed By:  Cleatrice Burke, 12/23/2020 11:41 AM ______________________________________________________________________   Discussed status with Dr. Naaman Plummer on 12/23/2020 at   1145 and received approval for admission today.   Admission Coordinator:  Cleatrice Burke, RN, time  0141 Date  12/23/2020    Assessment/Plan: Diagnosis: debility 1. Does the need for close, 24 hr/day Medical supervision in concert with the patient's rehab needs make it unreasonable for this patient to be served in a less intensive setting? Yes 2. Co-Morbidities requiring supervision/potential complications: Marfan Syndrome with MVP, bacteremia/endocarditis/MVR, persistent pleural effusions and CT, dysphagia 3. Due to bladder management, bowel management, safety, skin/wound care, disease management, medication administration, pain management and patient education, does the patient require 24 hr/day rehab nursing? Yes 4. Does the patient require coordinated care of a physician, rehab nurse, PT, OT, and SLP to address physical and functional deficits in the context of the above medical diagnosis(es)? Yes Addressing deficits in the following areas: balance, endurance, locomotion, strength, transferring, bowel/bladder control, bathing, dressing, feeding, grooming, toileting and psychosocial support 5. Can the patient actively participate in an intensive therapy program of at least 3 hrs of therapy 5 days a week? Yes 6. The potential for patient to make measurable gains while on inpatient rehab is excellent 7. Anticipated functional outcomes upon discharge from inpatient rehab: supervision PT, supervision and min assist OT, n/a SLP 8. Estimated rehab length of stay to reach the above functional goals is: 10-14 days 9. Anticipated discharge destination: Home 10. Overall Rehab/Functional Prognosis: excellent   MD Signature Meredith Staggers, MD, [place] Physical Medicine & Rehabilitation 12/23/2020           Revision History                             Note Details  Author Meredith Staggers, MD File Time 12/23/2020 12:13 PM  Author Type Physician Status Signed  Last  Editor Meredith Staggers, MD Service Physical Medicine and Rehabilitation

## 2020-12-23 NOTE — Progress Notes (Signed)
ANTICOAGULATION CONSULT NOTE - Initial Consult  Pharmacy Consult for warfarin Indication: mechanical MVR  No Known Allergies  Patient Measurements: Height: 5\' 10"  (177.8 cm) Weight: 53.7 kg (118 lb 6.2 oz) IBW/kg (Calculated) : 68.5  Vital Signs: Temp: 97.9 F (36.6 C) (05/23 1629) Temp Source: Oral (05/23 1629) BP: 102/58 (05/23 1629) Pulse Rate: 98 (05/23 1629)  Labs: Recent Labs    12/21/20 0340 12/22/20 0403 12/23/20 0740  HGB 8.6*  --   --   HCT 29.0*  --   --   PLT 274  --   --   LABPROT 37.6* 32.7* 28.1*  INR 3.8* 3.2* 2.6*  CREATININE 0.41*  --   --     Estimated Creatinine Clearance: 76.9 mL/min (A) (by C-G formula based on SCr of 0.41 mg/dL (L)).   Medical History: Past Medical History:  Diagnosis Date  . Marfan syndrome     Medications:  Medications Prior to Admission  Medication Sig Dispense Refill Last Dose  . atenolol (TENORMIN) 25 MG tablet Take 1 tablet by mouth daily.     Marland Kitchen atenolol (TENORMIN) 25 MG tablet TAKE 1 AND 1/2 TABLETS BY MOUTH DAILY 135 tablet 2   . doxycycline (VIBRA-TABS) 100 MG tablet Take 100 mg by mouth 2 (two) times daily.     Marland Kitchen losartan (COZAAR) 50 MG tablet Take 50 mg by mouth 2 (two) times daily.     Marland Kitchen losartan (COZAAR) 50 MG tablet TAKE 2 TABLETS BY MOUTH ONCE A DAY 60 tablet 0   . losartan (COZAAR) 50 MG tablet TAKE 2 TABLETS (100 MG) BY MOUTH DAILY 480 tablet 0     Assessment: 39 yof on warfarin for recent St Jude MVR due to endocarditis on 10/31/20. She has transferred to inpatient rehab from [facility] and pharmacy consulted to manage warfarin while in rehab.   Spoke with [facility] pharmacist re: warfarin dosing: received 5 mg on 5/16, 2.5 mg 5/17-5/19, held 5/21, 2.5 mg 5/22.  INR is therapeutic on low end of goal range today - confirmed warfarin had not been given prior to discharge. No bleeding noted, Hgb low stable 8-9s, platelets are normal.  Goal of Therapy:  INR 2.5-3.5 Monitor platelets by  anticoagulation protocol: Yes   Plan:  Warfarin 5 mg PO tonight Daily INR Monitor for s/sx of bleeding Needs education prior to discharge  Thank you for involving pharmacy in this patient's care.  Renold Genta, PharmD, BCPS Clinical Pharmacist Clinical phone for 12/23/2020 until 10p is [phone] 12/23/2020 4:38 PM  **Pharmacist phone directory can be found on [email or web address] listed under [place]**

## 2020-12-24 DIAGNOSIS — R5381 Other malaise: Secondary | ICD-10-CM | POA: Diagnosis not present

## 2020-12-24 DIAGNOSIS — L89152 Pressure ulcer of sacral region, stage 2: Secondary | ICD-10-CM | POA: Diagnosis not present

## 2020-12-24 DIAGNOSIS — D62 Acute posthemorrhagic anemia: Secondary | ICD-10-CM | POA: Diagnosis not present

## 2020-12-24 DIAGNOSIS — I4891 Unspecified atrial fibrillation: Secondary | ICD-10-CM | POA: Diagnosis not present

## 2020-12-24 LAB — CBC WITH DIFFERENTIAL/PLATELET
Abs Immature Granulocytes: 0.02 10*3/uL (ref 0.00–0.07)
Basophils Absolute: 0 10*3/uL (ref 0.0–0.1)
Basophils Relative: 0 %
Eosinophils Absolute: 0.1 10*3/uL (ref 0.0–0.5)
Eosinophils Relative: 1 %
HCT: 33.3 % — ABNORMAL LOW (ref 36.0–46.0)
Hemoglobin: 9.8 g/dL — ABNORMAL LOW (ref 12.0–15.0)
Immature Granulocytes: 0 %
Lymphocytes Relative: 28 %
Lymphs Abs: 1.9 10*3/uL (ref 0.7–4.0)
MCH: 28.9 pg (ref 26.0–34.0)
MCHC: 29.4 g/dL — ABNORMAL LOW (ref 30.0–36.0)
MCV: 98.2 fL (ref 80.0–100.0)
Monocytes Absolute: 0.4 10*3/uL (ref 0.1–1.0)
Monocytes Relative: 6 %
Neutro Abs: 4.4 10*3/uL (ref 1.7–7.7)
Neutrophils Relative %: 65 %
Platelets: 325 10*3/uL (ref 150–400)
RBC: 3.39 MIL/uL — ABNORMAL LOW (ref 3.87–5.11)
RDW: 16.4 % — ABNORMAL HIGH (ref 11.5–15.5)
WBC: 6.8 10*3/uL (ref 4.0–10.5)
nRBC: 0 % (ref 0.0–0.2)

## 2020-12-24 LAB — PROTIME-INR
INR: 2.4 — ABNORMAL HIGH (ref 0.8–1.2)
Prothrombin Time: 26.4 seconds — ABNORMAL HIGH (ref 11.4–15.2)

## 2020-12-24 LAB — COMPREHENSIVE METABOLIC PANEL
ALT: 17 U/L (ref 0–44)
AST: 21 U/L (ref 15–41)
Albumin: 2.4 g/dL — ABNORMAL LOW (ref 3.5–5.0)
Alkaline Phosphatase: 121 U/L (ref 38–126)
Anion gap: 8 (ref 5–15)
BUN: 14 mg/dL (ref 6–20)
CO2: 32 mmol/L (ref 22–32)
Calcium: 9.1 mg/dL (ref 8.9–10.3)
Chloride: 100 mmol/L (ref 98–111)
Creatinine, Ser: 0.35 mg/dL — ABNORMAL LOW (ref 0.44–1.00)
GFR, Estimated: >60 mL/min (ref >60)
Glucose, Bld: 87 mg/dL (ref 70–99)
Potassium: 4.1 mmol/L (ref 3.5–5.1)
Sodium: 140 mmol/L (ref 135–145)
Total Bilirubin: 0.7 mg/dL (ref 0.3–1.2)
Total Protein: 6.6 g/dL (ref 6.5–8.1)

## 2020-12-24 MED ORDER — WARFARIN SODIUM 5 MG PO TABS
5.0000 mg | ORAL_TABLET | Freq: Once | ORAL | Status: AC
  Administered 2020-12-24: 5 mg via ORAL
  Filled 2020-12-24: qty 1

## 2020-12-24 MED ORDER — ENSURE MAX PROTEIN PO LIQD
11.0000 [oz_av] | Freq: Every day | ORAL | Status: DC
  Administered 2020-12-24 – 2021-01-10 (×12): 11 [oz_av] via ORAL

## 2020-12-24 NOTE — Progress Notes (Signed)
ANTICOAGULATION CONSULT NOTE - Follow-Up Consult  Pharmacy Consult for warfarin Indication: mechanical MVR + Afib  No Known Allergies  Patient Measurements: Height: 5\' 10"  (177.8 cm) Weight: 53.7 kg (118 lb 6.2 oz) IBW/kg (Calculated) : 68.5  Vital Signs: Temp: 98.6 F (37 C) (05/24 0807) Temp Source: Oral (05/24 0407) BP: 135/76 (05/24 0807) Pulse Rate: 71 (05/24 0807)  Labs: Recent Labs    12/22/20 0403 12/23/20 0740 12/24/20 0503  HGB  --   --  9.8*  HCT  --   --  33.3*  PLT  --   --  325  LABPROT 32.7* 28.1* 26.4*  INR 3.2* 2.6* 2.4*  CREATININE  --   --  0.35*    Estimated Creatinine Clearance: 76.9 mL/min (A) (by C-G formula based on SCr of 0.35 mg/dL (L)).   Medical History: Past Medical History:  Diagnosis Date  . Marfan syndrome     Medications:  Medications Prior to Admission  Medication Sig Dispense Refill Last Dose  . atenolol (TENORMIN) 25 MG tablet Take 1 tablet by mouth daily.     Marland Kitchen atenolol (TENORMIN) 25 MG tablet TAKE 1 AND 1/2 TABLETS BY MOUTH DAILY 135 tablet 2   . doxycycline (VIBRA-TABS) 100 MG tablet Take 100 mg by mouth 2 (two) times daily.     Marland Kitchen losartan (COZAAR) 50 MG tablet Take 50 mg by mouth 2 (two) times daily.     Marland Kitchen losartan (COZAAR) 50 MG tablet TAKE 2 TABLETS BY MOUTH ONCE A DAY 60 tablet 0   . losartan (COZAAR) 50 MG tablet TAKE 2 TABLETS (100 MG) BY MOUTH DAILY 480 tablet 0     Assessment: 45 yo F on warfarin for recent St Jude MVR due to endocarditis on 10/31/20. She has transferred to inpatient rehab from [facility] and pharmacy consulted to manage warfarin while in rehab.   5/23 Pharmacist spoke with [facility] pharmacist re: warfarin dosing: received 5 mg on 5/16, 2.5 mg 5/17-5/19, held 5/21, 2.5 mg 5/22.  INR is slightly below goal range today.  Will repeat Warfarin 5mg  dose.   No bleeding noted, Hgb low stable 8-9s, platelets are normal.  Goal of Therapy:  INR 2.5-3.5 Monitor platelets by anticoagulation  protocol: Yes   Plan:  Warfarin 5 mg PO tonight Daily INR Monitor for s/sx of bleeding Needs education prior to discharge  Thank you for involving pharmacy in this patient's care.  [employer], Pharm.D., BCPS Clinical Pharmacist  **Pharmacist phone directory can be found on [email or web address] listed under [place].  12/24/2020 9:14 AM

## 2020-12-24 NOTE — Evaluation (Signed)
Physical Therapy Assessment and Plan  Patient Details  Name: Jennifer [name] MRN: [id number] Date of Birth: March 15, 1976  PT Diagnosis: Difficulty walking and Muscle weakness Rehab Potential: Good ELOS: 2-2.5 weeks   Today's Date: 12/24/2020 PT Individual Time: 3536-1443 and 1540-0867 PT Individual Time Calculation (min): 59 min    Hospital Problem: Active Problems:   Debility   Pressure injury of skin   Past Medical History:  Past Medical History:  Diagnosis Date  . Marfan syndrome    Past Surgical History:  Past Surgical History:  Procedure Laterality Date  . BUBBLE STUDY  12/09/2020   Procedure: BUBBLE STUDY;  Surgeon: Dixie Dials, MD;  Location: Center;  Service: Cardiovascular;;  . HERNIA REPAIR    . IR THORACENTESIS ASP PLEURAL SPACE W/IMG GUIDE  11/27/2020  . TEE WITHOUT CARDIOVERSION N/A 12/09/2020   Procedure: TRANSESOPHAGEAL ECHOCARDIOGRAM (TEE);  Surgeon: Dixie Dials, MD;  Location: [facility] ENDOSCOPY;  Service: Cardiovascular;  Laterality: N/A;    Assessment & Plan Clinical Impression: Patient is a 45 year old right-handed female with history ofcongenital pectus excavatum,Marfan syndrome followed by cardiology services [place], mitral valve prolapse and known mild to moderate mitral regurgitation. Per chart review she lives with her spouse in [place]. 1 level home 12 steps to entry. Husband works during the day. Good supportive family. She does have 3 adult aged children at home. 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 Hemoglobin 9.3. EKG demonstrated sinus tachycardia right bundle branch block. CTA chest no pulmonary emboli however findings compatible  fluid overload including cardiomegaly and small pericardial effusion small bilateral pleural effusion pulmonary interstitial edema. CT abdomen pelvis with contrast demonstrated small volume ascites. Patient did undergo diuresis. Placed on Lovenox for DVT prophylaxis. 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 Coumadin therapy was initiated. Sternal precautions as directed. She did require a right chest tube for a short time removed 12/18/2020 as well as did undergo a thoracentesis for pleural effusion.Marland Kitchen She was weaned from oxygen therapy. Hospital course again complicated by bouts of atrial fibrillation maintained on amiodarone advised to continue Coumadin with latest INR 2.6. Hospital course anemia 8.6 and monitored. Her diet was slowly advanced to regular consistency. She was transferred to [facility] 11/25/2020 for ongoing therapies.  Patient transferred to CIR on 12/23/2020 .   Patient currently requires mod with mobility secondary to muscle weakness, decreased cardiorespiratoy endurance and decreased sitting balance, decreased standing balance, decreased postural control and decreased balance strategies.  Prior to hospitalization, patient was independent  with mobility and lived with Spouse,Family in a House home.  Home access is 12Stairs to enter.  Patient will benefit from skilled PT intervention to maximize safe functional mobility, minimize fall risk and decrease caregiver burden for planned discharge home with 24 hour supervision.  Anticipate patient will benefit from follow up [place] at discharge.  PT - End of Session Activity Tolerance: Tolerates 10 - 20 min activity with multiple rests Endurance Deficit: Yes Endurance Deficit Description: Requires frequent rest breaks PT Assessment Rehab Potential  (ACUTE/IP ONLY): Good PT Barriers to Discharge: Home environment access/layout PT  Patient demonstrates impairments in the following area(s): Balance;Endurance;Motor;Pain;Safety PT Transfers Functional Problem(s): Bed Mobility;Bed to Chair;Furniture;Car PT Locomotion Functional Problem(s): Ambulation;Stairs PT Plan PT Intensity: Minimum of 1-2 x/day ,45 to 90 minutes PT Frequency: 5 out of 7 days PT Duration Estimated Length of Stay: 2-2.5 weeks PT Treatment/Interventions: Ambulation/gait training;Cognitive remediation/compensation;Discharge planning;DME/adaptive equipment instruction;Functional mobility training;Pain management;Psychosocial support;Splinting/orthotics;Therapeutic Activities;UE/LE Strength taining/ROM;Visual/perceptual remediation/compensation;Balance/vestibular training;Community reintegration;Disease management/prevention;Functional electrical stimulation;Neuromuscular re-education;Patient/family education;Stair training;Therapeutic Exercise;UE/LE Coordination activities;Wheelchair propulsion/positioning PT Transfers Anticipated Outcome(s): Supervision PT Locomotion Anticipated Outcome(s): Supervision PT Recommendation Follow Up Recommendations: Home health PT Patient destination: Home Equipment Recommended: To be determined   PT Evaluation Precautions/Restrictions Precautions Precautions: Sternal;Fall Restrictions Weight Bearing Restrictions: No General Chart Reviewed: Yes  Pain Pain Assessment Pain Scale: 0-10 Pain Score: 0-No pain Home Living/Prior Functioning Home Living Available Help at Discharge: Family;Friend(s);Available 24 hours/day Type of Home: House Home Access: Stairs to enter [employer] of Steps: 12 Entrance Stairs-Rails: Right;Left;Can reach both  Lives With: Spouse;Family Prior Function Level of Independence: Independent with basic ADLs;Independent with transfers;Independent with homemaking with ambulation;Independent with gait   Able to Take Stairs?: Yes Driving: Yes Vocation Requirements: Works Engineer, 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 Vision/Perception  Perception Perception: Within Functional Limits Praxis Praxis: Intact  Cognition Overall Cognitive Status: Within Functional Limits for tasks assessed Arousal/Alertness: Awake/alert Orientation Level: Oriented X4 Memory: Appears intact Safety/Judgment: Appears intact Sensation Sensation Light Touch: Appears Intact Coordination Gross Motor Movements are Fluid and Coordinated: Yes (Limited by generalized weakness) Fine Motor Movements are Fluid and Coordinated: Yes (Limited by generalized weakness) Motor  Motor Motor: Within Functional Limits  Trunk/Postural Assessment  Cervical Assessment Cervical Assessment:  (forwar head) Thoracic Assessment Thoracic Assessment:  (rounded shoulders) Lumbar Assessment Lumbar Assessment:  (posterior pelvic tilt) Postural Control Postural Control: Within Functional Limits  Balance Balance Balance Assessed: Yes Static Sitting Balance Static Sitting - Balance Support: Feet supported Static Sitting - Level of Assistance: 5: Stand by assistance Dynamic Sitting Balance Dynamic Sitting - Balance Support: Feet supported Dynamic Sitting - Level of Assistance: 4: Min assist Static Standing Balance Static Standing - Balance Support: During functional activity Static Standing - Level of Assistance: 4: Min assist Dynamic Standing Balance Dynamic Standing - Balance Support: During functional activity Dynamic Standing - Level of Assistance: 3: Mod assist Extremity Assessment  RLE Assessment General Strength Comments: Hips 2/5, Knees 4/5, Feet 4/5 LLE Assessment LLE Assessment: Exceptions to [facility] General Strength Comments: Hip 2+/5, Knee 4/5, Feet 4/5  Care Tool Care Tool Bed Mobility Roll left and right activity   Roll left and right assist level: Minimal Assistance - Patient > 75%    Sit to lying activity   Sit  to lying assist level: Minimal Assistance - Patient > 75%    Lying to sitting edge of bed activity   Lying to sitting edge of bed assist level: Moderate Assistance - Patient 50 - 74%     Care Tool Transfers Sit to stand transfer   Sit to stand assist level: Moderate Assistance - Patient 50 - 74%    Chair/bed transfer   Chair/bed transfer assist level: Moderate Assistance - Patient 50 - 74%     Toilet transfer   Assist Level: Moderate Assistance - Patient 50 - 74%    Car transfer   Car transfer assist level: Moderate Assistance - Patient 50 - 74%      Care Tool Locomotion Ambulation   Assist level: Moderate Assistance - Patient 50 - 74% Assistive device: Hand held assist Max  distance: 7'  Walk 10 feet activity Walk 10 feet activity did not occur: Safety/medical concerns       Walk 50 feet with 2 turns activity Walk 50 feet with 2 turns activity did not occur: Safety/medical concerns      Walk 150 feet activity Walk 150 feet activity did not occur: Safety/medical concerns      Walk 10 feet on uneven surfaces activity Walk 10 feet on uneven surfaces activity did not occur: Safety/medical concerns      Stairs Stair activity did not occur: Safety/medical concerns        Walk up/down 1 step activity Walk up/down 1 step or curb (drop down) activity did not occur: Safety/medical concerns     Walk up/down 4 steps activity did not occuR: Safety/medical concerns  Walk up/down 4 steps activity      Walk up/down 12 steps activity Walk up/down 12 steps activity did not occur: Safety/medical concerns      Pick up small objects from floor Pick up small object from the floor (from standing position) activity did not occur: Safety/medical concerns      Wheelchair Will patient use wheelchair at discharge?: No          Wheel 50 feet with 2 turns activity      Wheel 150 feet activity        Refer to Care Plan for Long Term Goals  SHORT TERM GOAL WEEK 1 PT Short Term  Goal 1 (Week 1): Pt will perform bed mobility with minA. PT Short Term Goal 2 (Week 1): Pt will perform sit to stand with minA. PT Short Term Goal 3 (Week 1): Pt will ambulate x50' with minA and LRAD. PT Short Term Goal 4 (Week 1): Pt will initiate stair training.  Recommendations for other services: None   Skilled Therapeutic Intervention  Evaluation completed (see details above and below) with education on PT POC and goals and individual treatment initiated with focus on balance, bed mobility, transfers, and ambulation. Pt received seated in WC and agrees to therapy. No complaint of pain. WC transport to gym for time management. Pt completes car transfers with modA and stand step technique. Pt performs sit to stand with modA and ambulates x7' with HHA. Following, pt performs sit to supine with minA. In supine pt performs strengthening for bilateral lower extremities, including 1x15 quad sets and glute sets. Pt performs 1x10 heel slides and requires rest break at 8 reps due to fatigue. Pt completes 1x15 SLRs with AAROM, and 2x10 supine bridges. Pt is able to just clear buttocks but cannot complete >50% of full range with bridges. Supine to sit with modA and cues on sequencing. Stand pivot from mat>WC>bed with modA. Pt left supine in bed with alarm intact and all needs within reach.   2nd Session: Pt received supine in bed and agrees to therapy. Reports no pain but endorses significant global fatigue. Supine to sit with modA and use of bed features, as well as verbal cues on logrolling technique. Sit to stand with modA and stand step to WC. WC transport to gym. Stand step to Nustep with modA. Pt completes Nustep activity for strength and endurance training. Initially pt on workload of 5 with steps per minute >50. Pt takes rest break after ~3 minutes. PT decreases workload to 3 due to pt report of fatigue and pt continues to perform with steps per minute~50. Pt completes total of 10:00 with x4-5 rest  breaks. Following pt reports global fatigue.  ModA for stand step transfer from nustep>WC>bed. Left supine with alarm intact and all needs within reach.  Mobility Bed Mobility Bed Mobility: Supine to Sit;Sit to Supine Supine to Sit: Moderate Assistance - Patient 50-74% Sit to Supine: Minimal Assistance - Patient > 75% Transfers Transfers: Sit to Stand;Stand to Sit Sit to Stand: Moderate Assistance - Patient 50-74% Stand to Sit: Moderate Assistance - Patient 50-74% Transfer (Assistive device): 1 person hand held assist Locomotion  Gait Ambulation: Yes Gait Assistance: Moderate Assistance - Patient 50-74% Gait Distance (Feet): 7 Feet Assistive device: 1 person hand held assist Gait Assistance Details: Verbal cues for technique;Verbal cues for gait pattern;Tactile cues for sequencing;Tactile cues for posture Gait Gait: Yes Gait Pattern: Impaired Gait Pattern: Shuffle Stairs / Additional Locomotion Stairs: No Wheelchair Mobility Wheelchair Mobility: Yes Wheelchair Assistance: Chartered loss adjuster: Both upper extremities Distance: 100'   Discharge Criteria: Patient will be discharged from PT if patient refuses treatment 3 consecutive times without medical reason, if treatment goals not met, if there is a change in medical status, if patient makes no progress towards goals or if patient is discharged from hospital.  The above assessment, treatment plan, treatment alternatives and goals were discussed and mutually agreed upon: by patient  Breck Coons, PT, DPT 12/24/2020, 3:41 PM

## 2020-12-24 NOTE — Evaluation (Signed)
Occupational Therapy Assessment and Plan  Patient Details  Name: Jennifer [name] MRN: [id number] Date of Birth: January 04, 1976  OT Diagnosis: abnormal posture, muscle weakness (generalized) and decreased cardiorespiratory endurance Rehab Potential: Rehab Potential (ACUTE ONLY): Excellent ELOS: 2.5 weeks   Today's Date: 12/24/2020  Session 1 OT Individual Time: 5597-4163 OT Individual Time Calculation (min): 56 min     Session 2 OT Individual Time: 1300-1330 OT Individual Time Calculation (min): 30 min     Hospital Problem: Active Problems:   Debility   Pressure injury of skin   Past Medical History:  Past Medical History:  Diagnosis Date  . Marfan syndrome    Past Surgical History:  Past Surgical History:  Procedure Laterality Date  . BUBBLE STUDY  12/09/2020   Procedure: BUBBLE STUDY;  Surgeon: Dixie Dials, MD;  Location: [place];  Service: Cardiovascular;;  . HERNIA REPAIR    . IR THORACENTESIS ASP PLEURAL SPACE W/IMG GUIDE  11/27/2020  . TEE WITHOUT CARDIOVERSION N/A 12/09/2020   Procedure: TRANSESOPHAGEAL ECHOCARDIOGRAM (TEE);  Surgeon: Dixie Dials, MD;  Location: [facility] ENDOSCOPY;  Service: Cardiovascular;  Laterality: N/A;    Assessment & Plan Clinical Impression: Patient is a 45 y.o. year old female with history of congenital pectus excavatum, Marfan syndrome followed by cardiology services Dr.Lisi at [facility], mitral valve prolapse and known mild to moderate mitral regurgitation.  Per chart review she lives with her spouse in [place].  1 level home 12 steps to entry.  Husband works during the day.  Good supportive family.  She does have 3 adult aged children at home.  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  On arrival to the ED she was tachycardic 110 blood pressure 110s  over 90, SPO2 90% on room air.  Hemoglobin 9.3.  EKG demonstrated sinus tachycardia right bundle branch block.  CTA chest no pulmonary emboli however findings compatible fluid overload including cardiomegaly and small pericardial effusion small bilateral pleural effusion pulmonary interstitial edema.  CT abdomen pelvis with contrast demonstrated small volume ascites.  Patient did undergo diuresis.  Placed on Lovenox for DVT prophylaxis.  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  Coumadin therapy was initiated.  Sternal precautions as directed.  She did require a right chest tube for a short time removed 12/18/2020 as well as did undergo a thoracentesis for pleural effusion.Marland Kitchen  She was weaned from oxygen therapy.  Hospital course again complicated by bouts of atrial fibrillation maintained on amiodarone advised to continue Coumadin with latest INR 2.6.  Hospital course anemia 8.6 and monitored.  Her diet was slowly advanced to regular consistency.  She was transferred to [facility] 11/25/2020 for ongoing therapies.  Therapy evaluation completed due to patient decreased functional mobility was admitted for a comprehensive rehab program. Patient transferred to CIR on 12/23/2020 .    Patient currently requires mod with basic self-care skills secondary to muscle weakness, decreased cardiorespiratoy endurance and decreased oxygen support and decreased sitting balance, decreased standing balance, decreased postural control, decreased balance strategies and difficulty maintaining precautions.  Prior to hospitalization, patient could complete BADL with independent .  Patient will benefit from skilled intervention to increase independence  with basic self-care skills prior to discharge home with care partner.  Anticipate patient will  require 24 hour supervision and follow up home health  OT Evaluation Precautions/Restrictions  Precautions Precautions: Sternal;Fall Restrictions Weight Bearing Restrictions: No Pain Pain Assessment Pain Scale: 0-10 Pain Score: 0-No pain Home Living/Prior Functioning Home Living Family/patient expects to be discharged to:: Private residence Living Arrangements: Spouse/significant other,Children Available Help at Discharge: Family,Friend(s),Available 24 hours/day Type of Home: House Home Access: Stairs to enter [employer] of Steps: 12 Entrance Stairs-Rails: Right,Left,Can reach both Home Layout: Two level,Laundry or work area in [employer] on main level (her home office is in the finished basement) Bathroom Shower/Tub: Chiropodist: Handicapped height Bathroom Accessibility: Yes  Lives With: Spouse,Family Prior Function Level of Independence: Independent with basic ADLs,Independent with homemaking with ambulation  Able to Take Stairs?: Yes Driving: Yes Vocation Requirements: Works Engineer, 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 Vision Baseline Vision/History: Wears glasses Perception  Perception: Within Functional Limits Praxis Praxis: Intact Cognition Overall Cognitive Status: Within Functional Limits for tasks assessed Arousal/Alertness: Awake/alert Orientation Level: Situation;Place;Person Person: Oriented Place: Oriented Situation: Oriented Year: 2022 Month: May Day of Week: Correct Memory: Appears intact Immediate Memory Recall: Sock;Blue;Bed Memory Recall Sock: With Cue Memory Recall Blue: Without Cue Memory Recall Bed: Without Cue Safety/Judgment: Appears intact Sensation Sensation Light Touch: Appears Intact Coordination Gross Motor Movements are Fluid and Coordinated: Yes (Limited by generalized weakness) Fine Motor Movements are Fluid and Coordinated: Yes (Limited by generalized weakness) Motor  Motor Motor:  Within Functional Limits  Trunk/Postural Assessment  Cervical Assessment Cervical Assessment:  (forwar head) Thoracic Assessment Thoracic Assessment:  (rounded shoulders) Lumbar Assessment Lumbar Assessment:  (posterior pelvic tilt) Postural Control Postural Control: Within Functional Limits  Balance Balance Balance Assessed: Yes Static Sitting Balance Static Sitting - Balance Support: Feet supported Static Sitting - Level of Assistance: 5: Stand by assistance Dynamic Sitting Balance Dynamic Sitting - Balance Support: Feet supported Dynamic Sitting - Level of Assistance: 4: Min assist Static Standing Balance Static Standing - Balance Support: During functional activity Static Standing - Level of Assistance: 4: Min assist Dynamic Standing Balance Dynamic Standing - Balance Support: During functional activity Dynamic Standing - Level of Assistance: 3: Mod assist Extremity/Trunk Assessment RUE Assessment RUE Assessment: Exceptions to [place] General Strength Comments: Generalized weakness LUE Assessment LUE Assessment: Exceptions to [facility] General Strength Comments: generalized weakness  Care Tool Care Tool Self Care Eating   Eating Assist Level: Set up assist    Oral Care    Oral Care Assist Level: Set up assist    Bathing   Body parts bathed by patient: Right arm;Left arm;Chest;Abdomen;Front perineal area;Buttocks;Right upper leg;Left upper leg;Face Body parts bathed by helper: Left lower leg;Right lower leg   Assist Level: Moderate Assistance - Patient 50 - 74%    Upper Body Dressing(including orthotics)   What is the patient wearing?: Pull over shirt   Assist Level: Moderate Assistance - Patient 50 - 74%    Lower Body Dressing (excluding footwear)   What is the patient wearing?: Underwear/pull up;Pants Assist for lower body dressing: Maximal Assistance - Patient 25 - 49%    Putting on/Taking off footwear   What is the patient wearing?: Non-skid slipper socks Assist  for footwear: Maximal Assistance - Patient 25 - 49%       Care Tool Toileting Toileting activity   Assist for toileting: Maximal Assistance - Patient 25 - 49%     Care Tool Bed Mobility Roll  left and right activity        Sit to lying activity        Lying to sitting edge of bed activity   Lying to sitting edge of bed assist level: Moderate Assistance - Patient 50 - 74%     Care Tool Transfers Sit to stand transfer   Sit to stand assist level: Maximal Assistance - Patient 25 - 49%    Chair/bed transfer   Chair/bed transfer assist level: Maximal Assistance - Patient 25 - 49%     Toilet transfer   Assist Level: Maximal Assistance - Patient 24 - 49%     Care Tool Cognition Expression of Ideas and Wants Expression of Ideas and Wants: Without difficulty (complex and basic) - expresses complex messages without difficulty and with speech that is clear and easy to understand   Understanding Verbal and Non-Verbal Content Understanding Verbal and Non-Verbal Content: Understands (complex and basic) - clear comprehension without cues or repetitions   Memory/Recall Ability *first 3 days only Memory/Recall Ability *first 3 days only: Current season;Location of own room;Staff names and faces    Refer to Care Plan for Long Term Goals  SHORT TERM GOAL WEEK 1 OT Short Term Goal 1 (Week 1): Pt will complete stand-pivot to [facility] with mod OT Short Term Goal 2 (Week 1): Pt will tolerate standing for 2 minutes within BADL task OT Short Term Goal 3 (Week 1): Pt will complete 1 step of LB dressing task  Recommendations for other services: Therapeutic Recreation  Stress management   Skilled Therapeutic Intervention Session 1 Pt greeted semi-reclined in bed and agreeable to OT eval and treat. OT eval completed addressing rehab process, OT purpose, POC, ELOS, and goals. OT reviewed sternal precautions prior to OOB activity and BADLs. Pt completed bed mobility with min/mod A. Mod A sit<>stand from  EOB while maintaining sternal precautions, then min A to pivot over to wc. BADLs completed from wc at the sink. Worked on sit<>stands and standing balance/endurance within BADLs. TED hose donned for BP management as pt with slight dizziness in standing. Grooming tasks completed from wc at the sink. Pt left seated in wc at end of session with alarm belt on, call bell in reach, and needs met.   Session 2 Pt greeted semi-reclined in bed and agreeable to OT treatment session. Attempted sit<>stand from EOB with RW, but pt just scooted forward and unable to power up. OT came in front of patient and provided knee block and max A to get pt into standing. Once into standing, OT provided pt with RW and she was able to pivot to wc with min A. OT brought in [place] to practice sit<>stands and discussed use of Stedy for nursing staff. Educated on keeping elbows in to maintain sternal precautions. SiT<>stand in [place] with mod A. OT then able to transition pt back to EOB in [place]. Worked on standing endurance with pt tolerating standing for 1 minute in [place]. 4 more sit<>stands from perched Stedy seat. OT educated on use of Stedy for [facility] transfers and provided encouragement and emotional support while discussing rehab process. Pt left semi-reclined in bed at end of session with bed alarm on, call bell in reach and needs met.   ADL ADL Eating: Set up Grooming: Setup Upper Body Bathing: Minimal assistance Lower Body Bathing: Maximal assistance Upper Body Dressing: Moderate assistance Lower Body Dressing: Maximal assistance Toileting: Maximal assistance Toilet Transfer: Moderate assistance Mobility  Bed Mobility Bed Mobility: Supine to Sit;Sit to  Supine Supine to Sit: Moderate Assistance - Patient 50-74% Sit to Supine: Minimal Assistance - Patient > 75% Transfers Sit to Stand: Maximal Assistance - Patient 25-49% Stand to Sit: Maximal Assistance - Patient 25-49%   Discharge Criteria: Patient will be discharged  from OT if patient refuses treatment 3 consecutive times without medical reason, if treatment goals not met, if there is a change in medical status, if patient makes no progress towards goals or if patient is discharged from hospital.  The above assessment, treatment plan, treatment alternatives and goals were discussed and mutually agreed upon: by patient  Valma Cava 12/24/2020, 3:57 PM

## 2020-12-24 NOTE — Progress Notes (Signed)
Initial Nutrition Assessment  DOCUMENTATION CODES:   Severe malnutrition in context of chronic illness,Underweight  INTERVENTION:  Continue Ensure Enlive po BID, each supplement provides 350 kcal and 20 grams of protein  Provide Ensure Max po once daily, each supplement provides 150 kcal and 30 grams of protein.   Encourage adequate PO intake.   NUTRITION DIAGNOSIS:   Severe Malnutrition related to chronic illness as evidenced by severe fat depletion,severe muscle depletion.  GOAL:   Patient will meet greater than or equal to 90% of their needs  MONITOR:   PO intake,Supplement acceptance,Skin,Weight trends,Labs,I & O's  REASON FOR ASSESSMENT:   Malnutrition Screening Tool    ASSESSMENT:   45 year old right-handed female with history of congenital pectus excavatum, Marfan syndrome, mitral valve prolapse and known mild to moderate mitral regurgitation. CTA chest no pulmonary emboli however findings compatible fluid overload including cardiomegaly and small pericardial effusion small bilateral pleural effusion pulmonary interstitial edema.  CT abdomen pelvis with contrast demonstrated small volume ascites. Patient did undergo diuresis. EE showed endocarditis with mildly decreased ejection fraction of 45 to 50%. 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 Pt with decreased functional mobility was admitted for a comprehensive rehab program.   Meal completion has been 95%. Pt reports having a good appetite currently and PTA with usual consumption of at least 2-3 meals a day with snack in between. Pt reports usual body weight of ~170 lbs, which she reports last weighing in August 2021. Pt with a reported 30% weight loss in 10 months. Noted hospitalization complicated with edema with diuresis. Weight loss may be partly related to fluid status. Pt currently has Ensure ordered and has been consuming them. RD  to continue with current orders to aid in caloric and protein needs. Discussed the importance of adequate caloric and protein needs on the body. Pt reports understanding of information discussed.    NUTRITION - FOCUSED PHYSICAL EXAM:  Flowsheet Row Most Recent Value  Orbital Region Unable to assess  Upper Arm Region Severe depletion  Thoracic and Lumbar Region Moderate depletion  Buccal Region Moderate depletion  Temple Region Unable to assess  Clavicle Bone Region Severe depletion  Clavicle and Acromion Bone Region Severe depletion  Scapular Bone Region Unable to assess  Dorsal Hand Unable to assess  Patellar Region Unable to assess  Anterior Thigh Region Unable to assess  Posterior Calf Region Unable to assess  Edema (RD Assessment) None  Hair Reviewed  Eyes Reviewed  Mouth Reviewed  Skin Reviewed  Nails Reviewed     Labs and medications reviewed.   Diet Order:   Diet Order            Diet regular Room service appropriate? Yes; Fluid consistency: Thin  Diet effective now                 EDUCATION NEEDS:   Not appropriate for education at this time  Skin:  Skin Assessment: Skin Integrity Issues: Skin Integrity Issues:: Stage II Stage II: sacrum  Last BM:  5/23  Height:   Ht Readings from Last 1 Encounters:  12/23/20 5\' 10"  (1.778 m)    Weight:   Wt Readings from Last 1 Encounters:  12/23/20 53.7 kg    Ideal Body Weight:  68.18 kg  BMI:  Body mass index is 16.99 kg/m.  Estimated Nutritional Needs:   Kcal:  1800-2000  Protein:  85-100 grams  Fluid:  >/= 1.8  L/day  Corrin Parker, MS, RD, LDN RD pager number/after hours weekend pager number on Amion.

## 2020-12-24 NOTE — Progress Notes (Signed)
   12/24/20 1220  Assess: MEWS Score  Temp 98 F (36.7 C)  BP 97/77  Pulse Rate (!) 112  Resp 16  SpO2 98 %  O2 Device Room Air  Assess: MEWS Score  MEWS Temp 0  MEWS Systolic 1  MEWS Pulse 2  MEWS RR 0  MEWS LOC 0  MEWS Score 3  MEWS Score Color Yellow  Assess: if the MEWS score is Yellow or Red  Were vital signs taken at a resting state? Yes  Focused Assessment No change from prior assessment  Early Detection of Sepsis Score *See Row Information* Low  MEWS guidelines implemented *See Row Information* Yes  Treat  MEWS Interventions Administered scheduled meds/treatments  Pain Scale 0-10  Pain Score 0  Take Vital Signs  Increase Vital Sign Frequency  Yellow: Q 2hr X 2 then Q 4hr X 2, if remains yellow, continue Q 4hrs  Notify: Charge Nurse/RN  Name of Charge Nurse/RN Notified Dauda  Date Charge Nurse/RN Notified 12/24/20  Time Charge Nurse/RN Notified 1227

## 2020-12-24 NOTE — Progress Notes (Signed)
PROGRESS NOTE   Subjective/Complaints: Pt had a reasonable night. Perhaps a little anxious but otherwise was quiet. BP was low this morning. A little light-headed with OT   ROS: Patient denies fever, rash, sore throat, blurred vision, nausea, vomiting, diarrhea, cough, shortness of breath or chest pain, joint or back pain, headache .    Objective:   No results found. Recent Labs    12/24/20 0503  WBC 6.8  HGB 9.8*  HCT 33.3*  PLT 325   Recent Labs    12/24/20 0503  NA 140  K 4.1  CL 100  CO2 32  GLUCOSE 87  BUN 14  CREATININE 0.35*  CALCIUM 9.1    Intake/Output Summary (Last 24 hours) at 12/24/2020 0955 Last data filed at 12/24/2020 0827 Gross per 24 hour  Intake 120 ml  Output --  Net 120 ml     Pressure Injury 12/23/20 Sacrum Mid;Medial Stage 2 -  Partial thickness loss of dermis presenting as a shallow open injury with a red, pink wound bed without slough. fissure like wound that is healing from a more round state (Active)  12/23/20 1626  Location: Sacrum  Location Orientation: Mid;Medial  Staging: Stage 2 -  Partial thickness loss of dermis presenting as a shallow open injury with a red, pink wound bed without slough.  Wound Description (Comments): fissure like wound that is healing from a more round state  Present on Admission: Yes    Physical Exam: Vital Signs Blood pressure 135/76, pulse 71, temperature 98.6 F (37 C), resp. rate 17, height 5\' 10"  (1.778 m), weight 53.7 kg, SpO2 94 %.  General: Alert and oriented x 3, No apparent distress, frail appearing HEENT: Head is normocephalic, atraumatic, PERRLA, EOMI, sclera anicteric, oral mucosa pink and moist, dentition intact, ext ear canals clear,  Neck: Supple without JVD or lymphadenopathy Heart:  Sinus tach, mitral click without murmur Chest: CTA bilaterally without wheezes, rales, or rhonchi; no distress Abdomen: Soft, non-tender, non-distended,  bowel sounds positive. Extremities: No clubbing, cyanosis, or edema. Pulses are 2+ Psych: Pt's affect is appropriate. Pt is cooperative Skin: sternal wound well healed. Sacral wound stg II Neuro: Pt is cognitively appropriate with normal insight, memory, and awareness. Cranial nerves 2-12 are intact. Sensory exam is normal. Reflexes are 2+ in all 4's. Fine motor coordination is intact. No tremors. Motor function is grossly 4/5 UE and 3-4/5 LE. Musculoskeletal: Full ROM, No pain with AROM or PROM in the neck, trunk, or extremities. Posture appropriate     Assessment/Plan: 1. Functional deficits which require 3+ hours per day of interdisciplinary therapy in a comprehensive inpatient rehab setting.  Physiatrist is providing close team supervision and 24 hour management of active medical problems listed below.  Physiatrist and rehab team continue to assess barriers to discharge/monitor patient progress toward functional and medical goals  Care Tool:  Bathing    Body parts bathed by patient: Right arm,Left arm,Chest,Abdomen,Front perineal area,Buttocks,Right upper leg,Left upper leg,Face   Body parts bathed by helper: Left lower leg,Right lower leg     Bathing assist Assist Level: Moderate Assistance - Patient 50 - 74%     Upper Body Dressing/Undressing Upper body dressing  What is the patient wearing?: Pull over shirt    Upper body assist Assist Level: Moderate Assistance - Patient 50 - 74%    Lower Body Dressing/Undressing Lower body dressing      What is the patient wearing?: Underwear/pull up,Pants     Lower body assist Assist for lower body dressing: Maximal Assistance - Patient 25 - 49%     Toileting Toileting    Toileting assist Assist for toileting: Maximal Assistance - Patient 25 - 49%     Transfers Chair/bed transfer  Transfers assist     Chair/bed transfer assist level: Moderate Assistance - Patient 50 - 74%     Locomotion Ambulation   Ambulation  assist              Walk 10 feet activity   Assist           Walk 50 feet activity   Assist           Walk 150 feet activity   Assist           Walk 10 feet on uneven surface  activity   Assist           Wheelchair     Assist               Wheelchair 50 feet with 2 turns activity    Assist            Wheelchair 150 feet activity     Assist          Blood pressure 135/76, pulse 71, temperature 98.6 F (37 C), resp. rate 17, height 5\' 10"  (1.778 m), weight 53.7 kg, SpO2 94 %.  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 Sternal precautions. -patient may shower -ELOS/Goals: 10-14 days, supervision with PT and supervision to min assist with OT  Patient is beginning CIR therapies today including PT and OT  2. Antithrombotics: -DVT/anticoagulation:Chronic Coumadin, INR therapeutic -dosing adjustments per pharmacy protocol -antiplatelet therapy: Aspirin 81 mg daily 3. Pain Management:Hydrocodone as needed 4. Mood:Zoloft 25 mg daily, Wellbutrin 150 mg daily, melatonin 6 mg nightly Xanax as needed -antipsychotic agents: N/A 5. Neuropsych: This patientiscapable of making decisions on herown behalf. 6. Skin/Wound Care:Routine skin checks             -foam dressing to sacrum             -encourage appropriate nutritional intake, pressure relief 7. Fluids/Electrolytes/Nutrition: encourage appropriate PO  -I personally reviewed all of the patient's labs today, and lab work is within normal limits.    -add protein supp for low albumin 8. Acute blood loss anemia. Follow-up hgb 9.8 today. Continue iron supplement 9. Atrial fibrillation. Cardizem 30 mg every 6 hours, metoprolol 25 mg 3 times daily, losartan 25 mg daily, amiodarone 200 mg  daily - sinus tachycardia in 110's again today -adjust chronotropics as needed based on activity tolerance -cards f/u as needed  -acclimation, TEDS for maintain BP 10. Mild constipation (since coming off abx) -added senna-s at bedtime---large BM this morning    LOS: 1 days A FACE TO [place] 12/24/2020, 9:55 AM

## 2020-12-24 NOTE — Patient Care Conference (Signed)
Inpatient RehabilitationTeam Conference and Plan of Care Update Date: 12/24/2020   Time: 10:48 AM     Patient Name: Jennifer [name]      Medical Record Number: [id number]  Date of Birth: 06/16/1976 Sex: Female         Room/Bed: 4W09C/4W09C-01 Payor Info: Payor: [employer]OY[employer] / Plan: [place] [employer] / Product Type: *No Product type* /    Admit Date/Time:  12/23/2020  4:13 PM  Primary Diagnosis:  <principal problem not specified>  Hospital Problems: Active Problems:   Debility   Pressure injury of skin    Expected Discharge Date: Expected Discharge Date:  (14 days)  Team Members Present: Physician leading conference: Dr. Alger Simons Care Coodinator Present: Loralee Pacas, LCSWA;Danetra Glock Creig Hines, RN, BSN, [place] Nurse Present: Dorthula Nettles, RN PT Present: Tereasa Coop, PT OT Present: Cherylynn Ridges, OT PPS Coordinator present : Gunnar Fusi, SLP     Current Status/Progress Goal Weekly Team Focus  Bowel/Bladder   continent b/b  remain continent      Swallow/Nutrition/ Hydration             ADL's   Mod A overall  supervision/mod I  self-care retraining, sternal precautions, endurance, OOB tolerance   Mobility   Eval pending         Communication             Safety/Cognition/ Behavioral Observations            Pain   no complaints of pain  < 3  assess pain q 4 hr and prn   Skin   stage 2 to Coccyx  no new skin breakdown  assess skin q shift and prn     Discharge Planning:  To be assessed. Per EMR pt to d/c to home with 24/7 care from various family members; pt husband goes to work at [employer].   Team Discussion: Multiple cardiac and pulmonary issues, mitral valve issues. BP low, tachycardic. Continent B/B, stage 2 to the coccyx, covered with foam. Discharging home with husband. Patient on target to meet rehab goals: Lower extremities are weak, patient is a mod assist. PT evaluation is pending.   *See Care Plan and progress notes for long and short-term goals.    Revisions to Treatment Plan:  Not at this time.  Teaching Needs: Family education, medication management, skin/wound care, transfer training, gait training, balance training, endurance training, stair training, safety awareness.  Current Barriers to Discharge: Decreased caregiver support, Medical stability, Home enviroment access/layout, Wound care, Lack of/limited family support, Weight, Medication compliance, Behavior and Nutritional means  Possible Resolutions to Barriers: Continue current medications, offer nutritional support, provide emotional support.     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 off abx, tachycardic, hypotension  Barriers to Discharge: Medical stability   Possible Resolutions to [employer] Focus: daily assessment of labs, pt data   Continued Need for Acute Rehabilitation Level of Care: The patient requires daily medical management by a physician with specialized training in physical medicine and rehabilitation for the following reasons: Direction of a multidisciplinary physical rehabilitation program to maximize functional independence : Yes Medical management of patient stability for increased activity during participation in an intensive rehabilitation regime.: Yes Analysis of laboratory values and/or radiology reports with any subsequent need for medication adjustment and/or medical intervention. : Yes   I attest that I was present, lead the team conference, and concur with the assessment and plan of the team.   Creig Hines,  Keondria Siever G 12/24/2020, 3:52 PM

## 2020-12-24 NOTE — Plan of Care (Signed)
  Problem: RH Balance Goal: LTG: Patient will maintain dynamic sitting balance (OT) Description: LTG:  Patient will maintain dynamic sitting balance with assistance during activities of daily living (OT) Flowsheets (Taken 12/24/2020 0947) LTG: Pt will maintain dynamic sitting balance during ADLs with: Independent Goal: LTG Patient will maintain dynamic standing with ADLs (OT) Description: LTG:  Patient will maintain dynamic standing balance with assist during activities of daily living (OT)  Flowsheets (Taken 12/24/2020 0947) LTG: Pt will maintain dynamic standing balance during ADLs with: Supervision/Verbal cueing   Problem: Sit to Stand Goal: LTG:  Patient will perform sit to stand in prep for activites of daily living with assistance level (OT) Description: LTG:  Patient will perform sit to stand in prep for activites of daily living with assistance level (OT) Flowsheets (Taken 12/24/2020 0947) LTG: PT will perform sit to stand in prep for activites of daily living with assistance level: Supervision/Verbal cueing   Problem: RH Grooming Goal: LTG Patient will perform grooming w/assist,cues/equip (OT) Description: LTG: Patient will perform grooming with assist, with/without cues using equipment (OT) Flowsheets (Taken 12/24/2020 0947) LTG: Pt will perform grooming with assistance level of: Independent   Problem: RH Bathing Goal: LTG Patient will bathe all body parts with assist levels (OT) Description: LTG: Patient will bathe all body parts with assist levels (OT) Flowsheets (Taken 12/24/2020 0947) LTG: Pt will perform bathing with assistance level/cueing: Supervision/Verbal cueing   Problem: RH Dressing Goal: LTG Patient will perform upper body dressing (OT) Description: LTG Patient will perform upper body dressing with assist, with/without cues (OT). Flowsheets (Taken 12/24/2020 0947) LTG: Pt will perform upper body dressing with assistance level of: Independent Goal: LTG Patient will  perform lower body dressing w/assist (OT) Description: LTG: Patient will perform lower body dressing with assist, with/without cues in positioning using equipment (OT) Flowsheets (Taken 12/24/2020 0947) LTG: Pt will perform lower body dressing with assistance level of: Supervision/Verbal cueing   Problem: RH Toileting Goal: LTG Patient will perform toileting task (3/3 steps) with assistance level (OT) Description: LTG: Patient will perform toileting task (3/3 steps) with assistance level (OT)  Flowsheets (Taken 12/24/2020 0947) LTG: Pt will perform toileting task (3/3 steps) with assistance level: Supervision/Verbal cueing   Problem: RH Toilet Transfers Goal: LTG Patient will perform toilet transfers w/assist (OT) Description: LTG: Patient will perform toilet transfers with assist, with/without cues using equipment (OT) Flowsheets (Taken 12/24/2020 0947) LTG: Pt will perform toilet transfers with assistance level of: Supervision/Verbal cueing   Problem: RH Tub/Shower Transfers Goal: LTG Patient will perform tub/shower transfers w/assist (OT) Description: LTG: Patient will perform tub/shower transfers with assist, with/without cues using equipment (OT) Flowsheets (Taken 12/24/2020 0947) LTG: Pt will perform tub/shower stall transfers with assistance level of: Supervision/Verbal cueing

## 2020-12-25 DIAGNOSIS — I4891 Unspecified atrial fibrillation: Secondary | ICD-10-CM | POA: Diagnosis not present

## 2020-12-25 DIAGNOSIS — L89152 Pressure ulcer of sacral region, stage 2: Secondary | ICD-10-CM | POA: Diagnosis not present

## 2020-12-25 DIAGNOSIS — R5381 Other malaise: Secondary | ICD-10-CM | POA: Diagnosis not present

## 2020-12-25 DIAGNOSIS — D62 Acute posthemorrhagic anemia: Secondary | ICD-10-CM | POA: Diagnosis not present

## 2020-12-25 LAB — PROTIME-INR
INR: 2.9 — ABNORMAL HIGH (ref 0.8–1.2)
Prothrombin Time: 30.3 seconds — ABNORMAL HIGH (ref 11.4–15.2)

## 2020-12-25 MED ORDER — WARFARIN SODIUM 2.5 MG PO TABS
2.5000 mg | ORAL_TABLET | Freq: Once | ORAL | Status: AC
  Administered 2020-12-25: 2.5 mg via ORAL
  Filled 2020-12-25: qty 1

## 2020-12-25 NOTE — Progress Notes (Signed)
Inpatient Rehabilitation Care Coordinator Assessment and Plan Patient Details  Name: Jennifer [name] MRN: [id number] Date of Birth: 01/09/1976  Today's Date: 12/25/2020  Hospital Problems: Active Problems:   Debility   Pressure injury of skin  Past Medical History:  Past Medical History:  Diagnosis Date  . Marfan syndrome    Past Surgical History:  Past Surgical History:  Procedure Laterality Date  . BUBBLE STUDY  12/09/2020   Procedure: BUBBLE STUDY;  Surgeon: Kadakia, Ajay, MD;  Location: [facility];  Service: Cardiovascular;;  . HERNIA REPAIR    . IR THORACENTESIS ASP PLEURAL SPACE W/IMG GUIDE  11/27/2020  . TEE WITHOUT CARDIOVERSION N/A 12/09/2020   Procedure: TRANSESOPHAGEAL ECHOCARDIOGRAM (TEE);  Surgeon: Kadakia, Ajay, MD;  Location: [facility];  Service: Cardiovascular;  Laterality: N/A;   Social History:  reports that she has never smoked. She has never used smokeless tobacco. She reports current alcohol use. She reports that she does not use drugs.  Family / Support Systems Marital Status: Married Patient Roles: Spouse,Parent Spouse/Significant Other: Jennifer [name] (husband): [phone] Children: 3 children: 24, 19, 17. Eldest child recently moved back home. 17 y.o. continues ot live at home. Other Supports: None reported Anticipated Caregiver: Husband and children Ability/Limitations of Caregiver: Pt reports that her husband works, but intends to take first two weeks at time of discharge. States children will help when they are home. 19 y.o dtr works but will help as needed. Caregiver Availability: 24/7 Family Dynamics: Pt lives with husband and three children.  Social History Preferred language: English Religion:  Cultural Background: Pt works from home for [employer] Education: some college Read: Yes Write: Yes Employment Status: Employed Name of Employer: [employer] Length of Employment:  (12 years) Return to Work Plans: Intends to return to work when able too  since she works from home. Legal History/Current Legal Issues: Denies Guardian/Conservator: N/A   Abuse/Neglect Abuse/Neglect Assessment Can Be Completed: Yes Physical Abuse: Denies Verbal Abuse: Denies Sexual Abuse: Denies Exploitation of patient/patient's resources: Denies Self-Neglect: Denies  Emotional Status Pt's affect, behavior and adjustment status: Pt in good spirits at time fo visit. Recent Psychosocial Issues: Pt reports that she was taking Wellbutrin prescribed from PCP for energy but does not see much of a difference. Reports when she was in cardiac ICU at [facility] she had some anxiety. Psychiatric History: Denies Substance Abuse History: Denies. EtoH-socially but not since Aug 2021.  Patient / Family Perceptions, Expectations & Goals Pt/Family understanding of illness & functional limitations: Pt has general understanding of care needs Premorbid pt/family roles/activities: Independent Anticipated changes in roles/activities/participation: Assistance with ADLs/IADLs Pt/family expectations/goals: Pt goal is "getting her energy level back; get up and walking, and get independent back."  Community Resources Community Agencies: None Premorbid Home Care/DME Agencies: None Transportation available at discharge: Husband Resource referrals recommended: Neuropsychology  Discharge Planning Living Arrangements: Spouse/significant other,Children Support Systems: Spouse/significant other,Children Type of Residence: Private residence Insurance Resources: Private Insurance (specify) ([place] [employer]) Financial Resources: Employment,Family Support Living Expenses: Mortgage Money Management: Spouse Does the patient have any problems obtaining your medications?: No Home Management: She reports she did all the cooking, and all family helps manages cleaning the home Patient/Family Preliminary Plans: TBD Care Coordinator Anticipated Follow Up Needs: HH/OP Expected length of stay: 2-2.5  weeks  Clinical Impression SW met with pt in room to introduce self, explain role, and discuss discharge process. Pt would like HCPOA consult. Pt is not a veteran. No DME. Home o2 pending for discharge. Pt not on   o2 when in room for assessment.   *SW shared with pt updates from team conference, and ELOS 2-2.5 weeks. Pt aware SW to follow-up with her husband.  Jennifer [name] 12/25/2020, 10:32 PM

## 2020-12-25 NOTE — Progress Notes (Signed)
Physical Therapy Session Note  Patient Details  Name: Jennifer [name] MRN: [id number] Date of Birth: 19-Dec-1975  Today's Date: 12/25/2020 PT Individual Time: 1002-1059 and 1434-1530 PT Individual Time Calculation (min): 57 min and 56 min  Short Term Goals: Week 1:  PT Short Term Goal 1 (Week 1): Pt will perform bed mobility with minA. PT Short Term Goal 2 (Week 1): Pt will perform sit to stand with minA. PT Short Term Goal 3 (Week 1): Pt will ambulate x50' with minA and LRAD. PT Short Term Goal 4 (Week 1): Pt will initiate stair training.  Skilled Therapeutic Interventions/Progress Updates:     1st Session: Pt received supine in bed and agrees to therapy. No complaint of pain. Supine to sit with verbal cues on positioning. Pt performs sit to stand form EOB with modA HHA and stand step to WC. Improved sequencing and body mechanics relative to yesterday. WC transport to gym for time management. Pt performs stand pivot to mat table with modA. Sit to prone with minA to attempt prone there-ex. Pt is unable to tolerate prone positioning secondary to difficulty breathing. Prone to supine with minA. Pt performs 1x15 clamshells with level 1 theraband. Progression to 1x15 with level 2 theraband and minA for external rotation initially progressing to pt performing without manual assistance. Pt then performs 3x5 supine bridging with therband around knees for increased engagement of abductors and external rotators. PT provides modA with towel under hips to facilitate full hip extension ROM, and pt controls eccentric lowering independently. Supine to sit with minA. Pt then performs sit to stand reps for strength and transfer training. PT uses towel to facilitate hip extension and pt holds onto PT's shoulders for upper extremity support. Pt performs sets of x2 with modA and pt focusing on eccentric control of stand to sit. Pt then ambulates with modA +1 HHA and +2 for WC follow. Bouts of 52' and 25' with seated rest  break. Pt ambualtes with very short stride length and slight buckle in knees with fatigue. WC transport back to room. Pt sits in [facility] with alarm intact and all needs within reach.  2nd Sessions: Pt received supine in bed and agrees to therapy. No complaint of pain. Supine to sit with cues on positioning. Sit to stand with modA and cues for body mechanics and anterior weight translation. WC transport to gym for time management. Stand pivot to Nustep with modA. Pt performs Nustep for strength and endurance training. Pt instructed to provide maximal effort for 1 minute at a time, and to allow exertion to recover between each bout. Pt initially performs at workload of 5 for 3 minutes and verbalizes significant fatigue. Workload decreased to 3 and pt continues same pattern. Steps per minute>50. Pt then performs standing activity for activity tolerance and strengthening. Pt stands to high low table with modA and performs pipe constructing activity to provide cognitive overlay as well as encourage pt to stand while performing functional activity with upper extremities. Pt able to stand for 3-4 minutes at a time with close supervision. WC transport back to room. Stand step transfer to bed with modA. Pt left supine with alarm intact and all needs within reach.  Therapy Documentation Precautions:  Precautions Precautions: Sternal,Fall Restrictions Weight Bearing Restrictions: Yes (sternal precautions)   Therapy/Group: Individual Therapy  Breck Coons, PT DPT 12/25/2020, 4:17 PM

## 2020-12-25 NOTE — Progress Notes (Signed)
PROGRESS NOTE   Subjective/Complaints: Pt had a reasonable night. Perhaps a little anxious but otherwise was quiet. BP was low this morning. A little light-headed with OT   ROS: Patient denies fever, rash, sore throat, blurred vision, nausea, vomiting, diarrhea, cough, shortness of breath or chest pain, joint or back pain, headache .    Objective:   No results found. Recent Labs    12/24/20 0503  WBC 6.8  HGB 9.8*  HCT 33.3*  PLT 325   Recent Labs    12/24/20 0503  NA 140  K 4.1  CL 100  CO2 32  GLUCOSE 87  BUN 14  CREATININE 0.35*  CALCIUM 9.1    Intake/Output Summary (Last 24 hours) at 12/25/2020 0820 Last data filed at 12/24/2020 1448 Gross per 24 hour  Intake 600 ml  Output --  Net 600 ml     Pressure Injury 12/23/20 Sacrum Mid;Medial Stage 2 -  Partial thickness loss of dermis presenting as a shallow open injury with a red, pink wound bed without slough. fissure like wound that is healing from a more round state (Active)  12/23/20 1626  Location: Sacrum  Location Orientation: Mid;Medial  Staging: Stage 2 -  Partial thickness loss of dermis presenting as a shallow open injury with a red, pink wound bed without slough.  Wound Description (Comments): fissure like wound that is healing from a more round state  Present on Admission: Yes    Physical Exam: Vital Signs Blood pressure 108/78, pulse (!) 105, temperature 97.7 F (36.5 C), temperature source Oral, resp. rate 18, height 5\' 10"  (1.778 m), weight 53.7 kg, SpO2 94 %.  General: Alert and oriented x 3, No apparent distress, frail appearing HEENT: Head is normocephalic, atraumatic, PERRLA, EOMI, sclera anicteric, oral mucosa pink and moist, dentition intact, ext ear canals clear,  Neck: Supple without JVD or lymphadenopathy Heart:  Sinus tach, mitral click without murmur Chest: CTA bilaterally without wheezes, rales, or rhonchi; no distress Abdomen:  Soft, non-tender, non-distended, bowel sounds positive. Extremities: No clubbing, cyanosis, or edema. Pulses are 2+ Psych: Pt's affect is appropriate. Pt is cooperative Skin: sternal wound well healed. Sacral wound stg II Neuro: Pt is cognitively appropriate with normal insight, memory, and awareness. Cranial nerves 2-12 are intact. Sensory exam is normal. Reflexes are 2+ in all 4's. Fine motor coordination is intact. No tremors. Motor function is grossly 4/5 UE and 3-4/5 LE. Musculoskeletal: Full ROM, No pain with AROM or PROM in the neck, trunk, or extremities. Posture appropriate     Assessment/Plan: 1. Functional deficits which require 3+ hours per day of interdisciplinary therapy in a comprehensive inpatient rehab setting.  Physiatrist is providing close team supervision and 24 hour management of active medical problems listed below.  Physiatrist and rehab team continue to assess barriers to discharge/monitor patient progress toward functional and medical goals  Care Tool:  Bathing    Body parts bathed by patient: Right arm,Left arm,Chest,Abdomen,Front perineal area,Buttocks,Right upper leg,Left upper leg,Face   Body parts bathed by helper: Left lower leg,Right lower leg     Bathing assist Assist Level: Moderate Assistance - Patient 50 - 74%     Upper Body  Dressing/Undressing Upper body dressing   What is the patient wearing?: Pull over shirt    Upper body assist Assist Level: Moderate Assistance - Patient 50 - 74%    Lower Body Dressing/Undressing Lower body dressing      What is the patient wearing?: Underwear/pull up,Pants     Lower body assist Assist for lower body dressing: Maximal Assistance - Patient 25 - 49%     Toileting Toileting    Toileting assist Assist for toileting: Maximal Assistance - Patient 25 - 49%     Transfers Chair/bed transfer  Transfers assist     Chair/bed transfer assist level: Maximal Assistance - Patient 25 - 49%      Locomotion Ambulation   Ambulation assist      Assist level: Moderate Assistance - Patient 50 - 74% Assistive device: Hand held assist Max distance: 7'   Walk 10 feet activity   Assist  Walk 10 feet activity did not occur: Safety/medical concerns        Walk 50 feet activity   Assist Walk 50 feet with 2 turns activity did not occur: Safety/medical concerns         Walk 150 feet activity   Assist Walk 150 feet activity did not occur: Safety/medical concerns         Walk 10 feet on uneven surface  activity   Assist Walk 10 feet on uneven surfaces activity did not occur: Safety/medical concerns         Wheelchair     Assist Will patient use wheelchair at discharge?: No             Wheelchair 50 feet with 2 turns activity    Assist            Wheelchair 150 feet activity     Assist          Blood pressure 108/78, pulse (!) 105, temperature 97.7 F (36.5 C), temperature source Oral, resp. rate 18, height 5\' 10"  (1.778 m), weight 53.7 kg, SpO2 94 %.  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 Sternal precautions. -patient may shower -ELOS/Goals: 10-14 days, supervision with PT and supervision to min assist with OT  -Continue CIR therapies including PT, OT   2. Antithrombotics: -DVT/anticoagulation:Chronic Coumadin, INR therapeutic -dosing adjustments per pharmacy protocol -antiplatelet therapy: Aspirin 81 mg daily 3. Pain Management:Hydrocodone as needed 4. Mood:Zoloft 25 mg daily, Wellbutrin 150 mg daily, melatonin 6 mg nightly Xanax as needed -antipsychotic agents: N/A  -mild anxiety 5. Neuropsych: This patientiscapable of making decisions on herown behalf. 6. Skin/Wound Care:Routine skin checks             -continue foam dressing to sacrum              -encourage appropriate nutritional intake, pressure relief 7. Fluids/Electrolytes/Severe protein deficient malutrition: encourage appropriate PO  -I personally reviewed all of the patient's labs today, and lab work is within normal limits.    -added protein supp for low albumin   -appreciate RD help 8. Acute blood loss anemia. Follow-up hgb 9.8 today. Continue iron supplement 9. Atrial fibrillation. Cardizem 30 mg every 6 hours, metoprolol 25 mg 3 times daily, losartan 25 mg daily, amiodarone 200 mg daily - sinus tachycardia in 110's. Doesn't seem to be going much higher -adjust chronotropics as needed based on activity tolerance -cards f/u as needed  -continue acclimation, TEDS for maintain BP 10. Mild constipation (  since coming off abx) -added senna-s at bedtime---moving bowels    LOS: 2 days A FACE TO [place] 12/25/2020, 8:20 AM

## 2020-12-25 NOTE — Progress Notes (Signed)
ANTICOAGULATION CONSULT NOTE - Follow-Up Consult  Pharmacy Consult for warfarin Indication: mechanical MVR + Afib  No Known Allergies  Patient Measurements: Height: 5\' 10"  (177.8 cm) Weight: 53.7 kg (118 lb 6.2 oz) IBW/kg (Calculated) : 68.5  Vital Signs: Temp: 97.7 F (36.5 C) (05/25 0403) Temp Source: Oral (05/25 0403) BP: 108/78 (05/25 0539) Pulse Rate: 105 (05/25 0539)  Labs: Recent Labs    12/23/20 0740 12/24/20 0503 12/25/20 0512  HGB  --  9.8*  --   HCT  --  33.3*  --   PLT  --  325  --   LABPROT 28.1* 26.4* 30.3*  INR 2.6* 2.4* 2.9*  CREATININE  --  0.35*  --     Estimated Creatinine Clearance: 76.9 mL/min (A) (by C-G formula based on SCr of 0.35 mg/dL (L)).   Medical History: Past Medical History:  Diagnosis Date  . Marfan syndrome     Medications:  Medications Prior to Admission  Medication Sig Dispense Refill Last Dose  . atenolol (TENORMIN) 25 MG tablet Take 1 tablet by mouth daily.     Marland Kitchen atenolol (TENORMIN) 25 MG tablet TAKE 1 AND 1/2 TABLETS BY MOUTH DAILY 135 tablet 2   . doxycycline (VIBRA-TABS) 100 MG tablet Take 100 mg by mouth 2 (two) times daily.     Marland Kitchen losartan (COZAAR) 50 MG tablet Take 50 mg by mouth 2 (two) times daily.     Marland Kitchen losartan (COZAAR) 50 MG tablet TAKE 2 TABLETS BY MOUTH ONCE A DAY 60 tablet 0   . losartan (COZAAR) 50 MG tablet TAKE 2 TABLETS (100 MG) BY MOUTH DAILY 480 tablet 0     Assessment: 45 yo F on warfarin for recent [employer] MVR due to endocarditis on 10/31/20. She has transferred to inpatient rehab from [facility] and pharmacy consulted to manage warfarin while in rehab.   5/23 Pharmacist spoke with [facility] pharmacist re: warfarin dosing: received 5 mg on 5/16, 2.5 mg 5/17-5/19, held 5/21, 2.5 mg 5/22.  INR is 2.9, therapeutic with goal 2.5-3.5. No bleeding noted, Hgb low stable 8-9s, platelets are normal/stable. Last CBC done on 12/24/20.   Goal of Therapy:  INR 2.5-3.5 Monitor platelets by  anticoagulation protocol: Yes   Plan:  Warfarin 2.5 mg PO tonight Daily INR Monitor for s/sx of bleeding Needs education prior to discharge  Thank you for involving pharmacy in this patient's care.  Nicole Cella, RPh Clinical Pharmacist  **Pharmacist phone directory can be found on [email or web address] listed under [place].  12/25/2020 10:55 AM

## 2020-12-25 NOTE — Progress Notes (Signed)
Occupational Therapy Session Note  Patient Details  Name: Jennifer [name] MRN: [id number] Date of Birth: 1976/05/09  Today's Date: 12/25/2020 OT Individual Time: 4114-6431 OT Individual Time Calculation (min): 60 min    Short Term Goals: Week 1:  OT Short Term Goal 1 (Week 1): Pt will complete stand-pivot to [facility] with mod OT Short Term Goal 2 (Week 1): Pt will tolerate standing for 2 minutes within BADL task OT Short Term Goal 3 (Week 1): Pt will complete 1 step of LB dressing task  Skilled Therapeutic Interventions/Progress Updates:    Pt received supine, calling to use the bathroom. Pt completed bed mobility with CGA. Pt completed sit > stand with max A from EOB. Cueing required for sternal precaution adherence. Once on her feet pt pivoted to the [facility] with min A. Min A for clothing management. Pt voided BM. She c/o slight light headedness, SpO2 was 96%, HR 118 bpm and BP 111/67. Pt stood with max A and was able to complete peri hygiene with min A for standing balance support. While pt was standing BSC was switched out for w/c and she was brought to the sink. Pt completed UB dressing and bathing with set up assist. Pt required several extended rest breaks. HR remained elevated but stable at 118-120 bpm throughout activity. Sit > stand from w/c required 4 attempts and heavy max A from OT. Pt able to don pants with mod A overall. HR increase to 125 bpm. Pt completed squat pivot transfer back to bed with mod A. She was left supine with all needs met, bed alarm set.   Therapy Documentation Precautions:  Precautions Precautions: Sternal,Fall Restrictions Weight Bearing Restrictions: No  Therapy/Group: Individual Therapy  Curtis Sites 12/25/2020, 6:21 AM

## 2020-12-25 NOTE — Progress Notes (Signed)
Patient was on the consult list for an AD. Chaplain carried it to her room and explained the procedure. When she is ready, nurse will notify Chaplain and a time will be set to sign.    12/25/20 1100  Clinical Encounter Type  Visited With Patient  Visit Type Initial  Referral From Nurse  Consult/Referral To Chaplain

## 2020-12-26 DIAGNOSIS — E43 Unspecified severe protein-calorie malnutrition: Secondary | ICD-10-CM | POA: Insufficient documentation

## 2020-12-26 DIAGNOSIS — R5381 Other malaise: Secondary | ICD-10-CM | POA: Diagnosis not present

## 2020-12-26 DIAGNOSIS — I4891 Unspecified atrial fibrillation: Secondary | ICD-10-CM | POA: Diagnosis not present

## 2020-12-26 DIAGNOSIS — L89152 Pressure ulcer of sacral region, stage 2: Secondary | ICD-10-CM | POA: Diagnosis not present

## 2020-12-26 DIAGNOSIS — D62 Acute posthemorrhagic anemia: Secondary | ICD-10-CM | POA: Diagnosis not present

## 2020-12-26 LAB — PROTIME-INR
INR: 3 — ABNORMAL HIGH (ref 0.8–1.2)
Prothrombin Time: 31 seconds — ABNORMAL HIGH (ref 11.4–15.2)

## 2020-12-26 MED ORDER — WARFARIN SODIUM 2.5 MG PO TABS
2.5000 mg | ORAL_TABLET | Freq: Once | ORAL | Status: AC
  Administered 2020-12-26: 2.5 mg via ORAL
  Filled 2020-12-26: qty 1

## 2020-12-26 MED ORDER — MEGESTROL ACETATE 400 MG/10ML PO SUSP
400.0000 mg | Freq: Two times a day (BID) | ORAL | Status: DC
  Administered 2020-12-26 – 2021-01-09 (×29): 400 mg via ORAL
  Filled 2020-12-26 (×29): qty 10

## 2020-12-26 NOTE — Evaluation (Signed)
Recreational Therapy Assessment and Plan  Patient Details  Name: Jennifer [name] MRN: [id number] Date of Birth: 11-25-1975 Today's Date: 12/26/2020  Rehab Potential:  Good  ELOS:   2.5 weeks  [facility] Problem: Active Problems:   Debility   Pressure injury of skin   Past Medical History:      Past Medical History:  Diagnosis Date  . Marfan syndrome    Past Surgical History:       Past Surgical History:  Procedure Laterality Date  . BUBBLE STUDY  12/09/2020   Procedure: BUBBLE STUDY;  Surgeon: Dixie Dials, MD;  Location: [place];  Service: Cardiovascular;;  . HERNIA REPAIR    . IR THORACENTESIS ASP PLEURAL SPACE W/IMG GUIDE  11/27/2020  . TEE WITHOUT CARDIOVERSION N/A 12/09/2020   Procedure: TRANSESOPHAGEAL ECHOCARDIOGRAM (TEE);  Surgeon: Dixie Dials, MD;  Location: [facility] ENDOSCOPY;  Service: Cardiovascular;  Laterality: N/A;    Assessment & Plan Clinical Impression: Patient is a 45 y.o. year old female with history ofcongenital pectus excavatum,Marfan syndrome followed by cardiology services [place], mitral valve prolapse and known mild to moderate mitral regurgitation. Per chart review she lives with her spouse in [place]. 1 level home 12 steps to entry. Husband works during the day. Good supportive family. She does have 3 adult aged children at home. 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 Hemoglobin 9.3. EKG demonstrated sinus tachycardia right bundle branch block. CTA chest no pulmonary emboli however findings compatible fluid overload including cardiomegaly and small pericardial effusion small bilateral pleural effusion pulmonary  interstitial edema. CT abdomen pelvis with contrast demonstrated small volume ascites. Patient did undergo diuresis. Placed on Lovenox for DVT prophylaxis. 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 Coumadin therapy was initiated. Sternal precautions as directed. She did require a right chest tube for a short time removed 12/18/2020 as well as did undergo a thoracentesis for pleural effusion.Marland Kitchen She was weaned from oxygen therapy. Hospital course again complicated by bouts of atrial fibrillation maintained on amiodarone advised to continue Coumadin with latest INR 2.6. Hospital course anemia 8.6 and monitored. Her diet was slowly advanced to regular consistency. She was transferred to [facility] 11/25/2020 for ongoing therapies.Therapy evaluation completed due to patient decreased functional mobility was admitted for a comprehensive rehab program. Patient transferred to CIR on 12/23/2020 .    Pt presents with decreased activity tolerance, decreased functional mobility, decreased balance, decreased oxygen support and difficulty maintaining precautions.  Met with pt today to discuss leisure interests, activity analysis/potential modifications & coping strategies.  Pt tearful at times discussing hospitalization, being away from home/family and recognizing progress thus far in rehab stay.  Active listening/emotional support provided.  Discussed the use of journaling to assist in recognizing progress and expressing her feelings.   Also discussed the use of aromatherapy as a therapy intervention including type of oil used, how it would be delivered, desired effect and complications from use including headache   2 drops of lavender essential oil was applied to a cottonball and placed on bedside table for inhalation in  the  room.  Nursing alerted about pts use of aromatherapy and instructed to remove essential oil from the room if pt requests it or c/o headaache.  Pt stated understanding and is agreement with the above.   Recommendations for other services: Neuropsych  Discharge Criteria: Patient will be discharged from TR if patient refuses treatment 3 consecutive times without medical reason.  If treatment goals not met, if there is a change in medical status, if patient makes no progress towards goals or if patient is discharged from hospital.  The above assessment, treatment plan, treatment alternatives and goals were discussed and mutually agreed upon: by patient  Reynolds 12/26/2020, 3:13 PM

## 2020-12-26 NOTE — Progress Notes (Signed)
PROGRESS NOTE   Subjective/Complaints: Woke up with nausea this morning. Unsure why. Hasn't had much appetite in general either. Was on marinol at [facility] but it made her feel "high". Feels that anxiety is under control  ROS: Patient denies fever, rash, sore throat, blurred vision, vomiting, diarrhea, cough, shortness of breath or chest pain, joint or back pain, headache, or mood change.   Objective:   No results found. Recent Labs    12/24/20 0503  WBC 6.8  HGB 9.8*  HCT 33.3*  PLT 325   Recent Labs    12/24/20 0503  NA 140  K 4.1  CL 100  CO2 32  GLUCOSE 87  BUN 14  CREATININE 0.35*  CALCIUM 9.1    Intake/Output Summary (Last 24 hours) at 12/26/2020 9935 Last data filed at 12/26/2020 0827 Gross per 24 hour  Intake 1100 ml  Output --  Net 1100 ml     Pressure Injury 12/23/20 Sacrum Mid;Medial Stage 2 -  Partial thickness loss of dermis presenting as a shallow open injury with a red, pink wound bed without slough. fissure like wound that is healing from a more round state (Active)  12/23/20 1626  Location: Sacrum  Location Orientation: Mid;Medial  Staging: Stage 2 -  Partial thickness loss of dermis presenting as a shallow open injury with a red, pink wound bed without slough.  Wound Description (Comments): fissure like wound that is healing from a more round state  Present on Admission: Yes    Physical Exam: Vital Signs Blood pressure 99/70, pulse 81, temperature 97.7 F (36.5 C), resp. rate 18, height 5\' 10"  (1.778 m), weight 53.7 kg, SpO2 91 %.  Constitutional: No distress . Vital signs reviewed.  HEENT: EOMI, oral membranes moist Neck: supple Cardiovascular: tachy with mitral click. No JVD    Respiratory/Chest: CTA Bilaterally without wheezes or rales. Normal effort    GI/Abdomen: BS +, non-tender, non-distended Ext: no clubbing, cyanosis, or edema Psych: pleasant and cooperative, sl  apprehensive Skin: sternal wound well healed. Sacral wound stg II Neuro: Pt is cognitively appropriate with normal insight, memory, and awareness. Cranial nerves 2-12 are intact. Sensory exam is normal. Reflexes are 2+ in all 4's. Fine motor coordination is intact. No tremors. Motor function is grossly 4/5 UE and 3-4/5 LE. Musculoskeletal: Full ROM, No pain with AROM or PROM in the neck, trunk, or extremities. Posture appropriate     Assessment/Plan: 1. Functional deficits which require 3+ hours per day of interdisciplinary therapy in a comprehensive inpatient rehab setting.  Physiatrist is providing close team supervision and 24 hour management of active medical problems listed below.  Physiatrist and rehab team continue to assess barriers to discharge/monitor patient progress toward functional and medical goals  Care Tool:  Bathing    Body parts bathed by patient: Right arm,Left arm,Chest,Abdomen,Front perineal area,Buttocks,Right upper leg,Left upper leg,Face   Body parts bathed by helper: Left lower leg,Right lower leg     Bathing assist Assist Level: Moderate Assistance - Patient 50 - 74%     Upper Body Dressing/Undressing Upper body dressing   What is the patient wearing?: Pull over shirt    Upper body assist Assist Level:  Moderate Assistance - Patient 50 - 74%    Lower Body Dressing/Undressing Lower body dressing      What is the patient wearing?: Underwear/pull up     Lower body assist Assist for lower body dressing: Minimal Assistance - Patient > 75% (from bed)     Toileting Toileting    Toileting assist Assist for toileting: Maximal Assistance - Patient 25 - 49%     Transfers Chair/bed transfer  Transfers assist     Chair/bed transfer assist level: Maximal Assistance - Patient 25 - 49%     Locomotion Ambulation   Ambulation assist      Assist level: 2 helpers Assistive device: Hand held assist Max distance: 31'   Walk 10 feet  activity   Assist  Walk 10 feet activity did not occur: Safety/medical concerns  Assist level: 2 helpers Assistive device: Hand held assist   Walk 50 feet activity   Assist Walk 50 feet with 2 turns activity did not occur: Safety/medical concerns         Walk 150 feet activity   Assist Walk 150 feet activity did not occur: Safety/medical concerns         Walk 10 feet on uneven surface  activity   Assist Walk 10 feet on uneven surfaces activity did not occur: Safety/medical concerns         Wheelchair     Assist Will patient use wheelchair at discharge?: No             Wheelchair 50 feet with 2 turns activity    Assist            Wheelchair 150 feet activity     Assist          Blood pressure 99/70, pulse 81, temperature 97.7 F (36.5 C), resp. rate 18, height 5\' 10"  (1.778 m), weight 53.7 kg, SpO2 91 %.  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 Sternal precautions. -patient may shower -ELOS/Goals: 10-14 days, supervision with PT and supervision to min assist with OT  -Continue CIR therapies including PT, OT  2. Antithrombotics: -DVT/anticoagulation:Chronic Coumadin, INR supratherapeutic -dosing adjustments per pharmacy protocol -antiplatelet therapy: Aspirin 81 mg daily 3. Pain Management:Hydrocodone as needed 4. Mood:Zoloft 25 mg daily, Wellbutrin 150 mg daily, melatonin 6 mg nightly Xanax as needed -antipsychotic agents: N/A  -mild anxiety 5. Neuropsych: This patientiscapable of making decisions on herown behalf. 6. Skin/Wound Care:Routine skin checks             -continue foam dressing to sacrum             -encourage appropriate nutritional intake, pressure relief 7. Fluids/Electrolytes/Severe protein deficient malutrition: encourage  appropriate PO    -added protein supp for low albumin   -appreciate RD help   -5/26 begin megace trial---pt agrees 8. Acute blood loss anemia. Follow-up hgb 9.8 today. Continue iron supplement  -hold iron supplement d/t nausea 9. Atrial fibrillation. Cardizem 30 mg every 6 hours, metoprolol 25 mg 3 times daily, losartan 25 mg daily, amiodarone 200 mg daily - sinus tachycardia in 110's. Doesn't seem to be going much higher -adjust chronotropics as needed based on activity tolerance -cards f/u as needed  -continue acclimation, TEDS for maintain BP---tolerating activity so far 10. Mild constipation (since coming off abx) -added senna-s at bedtime---moving bowels    LOS: 3 days A FACE TO FACE EVALUATION WAS PERFORMED  Meredith Staggers 12/26/2020,  9:11 AM

## 2020-12-26 NOTE — Care Management (Signed)
[facility] Individual Statement of Services  Patient Name:  Jennifer [name]  Date:  12/26/2020  Welcome to the [facility].  Our goal is to provide you with an individualized program based on your diagnosis and situation, designed to meet your specific needs.  With this comprehensive rehabilitation program, you will be expected to participate in at least 3 hours of rehabilitation therapies Monday-Friday, with modified therapy programming on the weekends.  Your rehabilitation program will include the following services:  Physical Therapy (PT), Occupational Therapy (OT), 24 hour per day rehabilitation nursing, Therapeutic Recreaction (TR), Psychology, Neuropsychology, Care Coordinator, Rehabilitation Medicine, Nutrition Services, Pharmacy Services and Other  Weekly team conferences will be held on Tuesdays to discuss your progress.  Your Inpatient Rehabilitation Care Coordinator will talk with you frequently to get your input and to update you on team discussions.  Team conferences with you and your family in attendance may also be held.  Expected length of stay: 2-2.5 weeks   Overall anticipated outcome: Supervision  Depending on your progress and recovery, your program may change. Your Inpatient Rehabilitation Care Coordinator will coordinate services and will keep you informed of any changes. Your Inpatient Rehabilitation Care Coordinator's name and contact numbers are listed  below.  The following services may also be recommended but are not provided by the [facility] will be made to provide these services after discharge if needed.  Arrangements include referral to agencies that provide these services.  Your insurance has been verified to be:  Jennifer [name]  Your primary doctor is:   Jennifer [name]  Pertinent information will be shared with your doctor and your insurance company.  Inpatient Rehabilitation Care Coordinator:  Jennifer [name] [phone] or ([phone]  Information discussed with and copy given to patient by: Jennifer [name], 12/26/2020, 10:26 AM

## 2020-12-26 NOTE — Plan of Care (Signed)
  Problem: Consults Goal: [facility] GENERAL PATIENT EDUCATION Description: See Patient Education module for education specifics. Outcome: Progressing   

## 2020-12-26 NOTE — Progress Notes (Signed)
Patient ID: Jennifer [name], female   DOB: 09-Mar-1976, 45 y.o.   MRN: [id number]  SW left message for [name] [phone]) to introduce self, role, and inform on follow-ups every Tuesday with patient with updates from medical team. SW encouraged follow-up at his convenience.    Loralee Pacas, MSW, [place] Office: [phone] Cell: [phone] Fax: [phone]

## 2020-12-26 NOTE — Progress Notes (Signed)
Occupational Therapy Session Note  Patient Details  Name: Jennifer [name] MRN: [id number] Date of Birth: 01/14/1976  Today's Date: 12/26/2020  Session 1 OT Individual Time: 9937-1696 OT Individual Time Calculation (min): 59 min   Session 2 OT Individual Time: 7893-8101 OT Individual Time Calculation (min): 30 min    Short Term Goals: Week 1:  OT Short Term Goal 1 (Week 1): Pt will complete stand-pivot to [facility] with mod OT Short Term Goal 2 (Week 1): Pt will tolerate standing for 2 minutes within BADL task OT Short Term Goal 3 (Week 1): Pt will complete 1 step of LB dressing task  Skilled Therapeutic Interventions/Progress Updates:    Session 1 Pt greeted with nurse tech present getting off of bed pan. Nurse tech provided total A for peri-care, then pt was able to pull her pants up using bridging. Pt completed bed mobility with supervision and reports her stomach is a little upset this morning. Pt wanted to try sitting on [facility] for a bit. Pt needed max A to come to standing while maintaining sternal precautions, she then was able to pivot over to [facility] with min A. Min A for balance while pulling down pants. Pt unable to void more or have BM. Sit<>stand again from [facility] with mod A, then min A for balance while pt washed buttocks. Min A pivot to wc. UB bathing/dressing completed with min A 2/2 button up shirt. Worked on fine motor control with buttoning shirt and some mild tremors 2/2 fatigue. OT educated on friction reducing bag to don TED hose. Pt able to achieve figure 4 position to don socks and thread pants. Max  A  To power up to standing at the sink and pull up pants. Grooming tasks completed from sitting position in wc 2/2 fatigue. Pt left seated in wc at end of session with alarm belt on, call bell in reach, and needs met.   Session 2 Pt greeted seated in wc and agreeable to OT treatment session. Pt brought down to therapy apartment and was educated on tub bench transfer in simulated home  environment. Mod A sit<>stand from wc, then min A to ambulate w/ RW into bathroom and transfer onto tub bench. Min A to lift LEs over tub ledge. Similar technique to get out of tub. Worked on standing balance/endurance with standing BITS activity using alternating UEs. Pt tolerated standing for 3, 2 minute intervals with extended rest breaks in between. Pt returned to room and transferred back to bed with mod A stand-pivot without AD. Pt left semi-reclined in bed with bed alarm on, call bell in reach, and needs met.   Therapy Documentation Precautions:  Precautions Precautions: Sternal,Fall Restrictions Weight Bearing Restrictions: No Pain: Pain Assessment Pain Scale: 0-10 Pain Score: 0-No paindenies pain, just general soreness  Therapy/Group: Individual Therapy  Valma Cava 12/26/2020, 3:17 PM

## 2020-12-26 NOTE — IPOC Note (Signed)
Overall Plan of Care [facility]) Patient Details Name: Jennifer [name] MRN: [id number] DOB: 10/29/1975  Admitting Diagnosis: Debility  Hospital Problems: Principal Problem:   Debility Active Problems:   Pressure injury of skin   Protein-calorie malnutrition, severe     Functional Problem List: Nursing Behavior,Bladder,Bowel,Edema,Endurance,Medication Management,Pain,Safety,Skin Integrity  PT Balance,Endurance,Motor,Pain,Safety  OT Balance,Endurance,Motor,Skin Integrity  SLP    TR         Basic ADL's: OT Grooming,Bathing,Dressing,Toileting     Advanced  ADL's: OT       Transfers: PT Bed Mobility,Bed to Chair,Furniture,Car  OT Toilet,Tub/Shower     Locomotion: PT Ambulation,Stairs     Additional Impairments: OT None  SLP        TR      Anticipated Outcomes Item Anticipated Outcome  Self Feeding    Swallowing      Basic self-care  Supervision/mod I  Conservation officer, nature  Supervision  Transfers  Supervision  Locomotion  Supervision  Communication     Cognition     Pain  < 3  Safety/Judgment  Supervision and no falls   Therapy Plan: PT Intensity: Minimum of 1-2 x/day ,45 to 90 minutes PT Frequency: 5 out of 7 days PT Duration Estimated Length of Stay: 2-2.5 weeks OT Intensity: Minimum of 1-2 x/day, 45 to 90 minutes OT Frequency: 5 out of 7 days OT Duration/Estimated Length of Stay: 2.5 weeks     Due to the current state of emergency, patients may not be receiving their 3-hours of Medicare-mandated therapy.   Team Interventions: Nursing Interventions Patient/Family Education,Bladder Management,Bowel Management,Disease Management/Prevention,Pain Management,Medication Management,Skin Care/Wound Management,Discharge Planning,Psychosocial Support  PT interventions Ambulation/gait training,Cognitive remediation/compensation,Discharge planning,DME/adaptive equipment instruction,Functional mobility  training,Pain management,Psychosocial support,Splinting/orthotics,Therapeutic Activities,UE/LE Strength taining/ROM,Visual/perceptual remediation/compensation,Balance/vestibular training,Community reintegration,Disease management/prevention,Functional electrical stimulation,Neuromuscular re-education,Patient/family education,Stair training,Therapeutic Exercise,UE/LE Museum/gallery conservator propulsion/positioning  OT Interventions Balance/vestibular training,Community reintegration,Discharge planning,Disease mangement/prevention,DME/adaptive equipment instruction,Functional mobility training,Neuromuscular re-education,Pain management,Patient/family education,Psychosocial support,Self Care/advanced ADL retraining,Skin care/wound managment,Splinting/orthotics,Therapeutic Activities,Therapeutic Exercise,UE/LE Strength taining/ROM,UE/LE Coordination activities,Visual/perceptual remediation/compensation,Wheelchair propulsion/positioning  SLP Interventions    TR Interventions    SW/CM Interventions Discharge Planning,Psychosocial Support,Patient/Family Education   Barriers to Discharge MD  Medical stability  Nursing Decreased caregiver support,Home environment access/layout,IV antibiotics,Incontinence,Wound Care,Lack of/limited family support,Weight bearing restrictions,Medication compliance,Behavior    PT Home environment access/layout    OT      SLP      SW       Team Discharge Planning: Destination: PT-Home ,OT- Home , SLP-  Projected Follow-up: PT-Home health PT, OT-  Home health OT,Outpatient OT (vs), SLP-  Projected Equipment Needs: PT-To be determined, OT- Tub/shower seat,3 in 1 bedside comode, SLP-  Equipment Details: PT- , OT-  Patient/family involved in discharge planning: PT- Patient,  OT-Patient, SLP-   MD ELOS: 2-2.5 weeks Medical Rehab Prognosis:  Excellent Assessment: The patient has been admitted for CIR therapies with the diagnosis of debility after endocarditis,  MVR/reconstructive surgery. The team will be addressing functional mobility, strength, stamina, balance, safety, adaptive techniques and equipment, self-care, bowel and bladder mgt, patient and caregiver education, NMR, stamina, HR control, nutrition, coping skills. Goals have been set at supervision for mobility and self-care.   Due to the current state of emergency, patients may not be receiving their 3 hours per day of Medicare-mandated therapy.    Meredith Staggers, MD, FAAPMR      See Team Conference Notes for weekly updates to the plan of care

## 2020-12-26 NOTE — Progress Notes (Addendum)
ANTICOAGULATION CONSULT NOTE - Follow-Up Consult  Pharmacy Consult for warfarin Indication: mechanical MVR + Afib  No Known Allergies  Patient Measurements: Height: 5\' 10"  (177.8 cm) Weight: 53.7 kg (118 lb 6.2 oz) IBW/kg (Calculated) : 68.5  Vital Signs: Temp: 97.7 F (36.5 C) (05/26 0415) BP: 99/70 (05/26 0415) Pulse Rate: 81 (05/26 0415)  Labs: Recent Labs    12/24/20 0503 12/25/20 0512 12/26/20 0443  HGB 9.8*  --   --   HCT 33.3*  --   --   PLT 325  --   --   LABPROT 26.4* 30.3* 31.0*  INR 2.4* 2.9* 3.0*  CREATININE 0.35*  --   --     Estimated Creatinine Clearance: 76.9 mL/min (A) (by C-G formula based on SCr of 0.35 mg/dL (L)).   Medical History: Past Medical History:  Diagnosis Date  . Marfan syndrome     Medications:  Medications Prior to Admission  Medication Sig Dispense Refill Last Dose  . Amino Acids-Protein Hydrolys (PRO-STAT 64 PO) Take 30 mLs by mouth 3 (three) times daily.     Marland Kitchen amiodarone (PACERONE) 200 MG tablet Take 200 mg by mouth daily.     Marland Kitchen aspirin EC 81 MG tablet Take 81 mg by mouth daily. Swallow whole.     Marland Kitchen buPROPion (WELLBUTRIN) 75 MG tablet Take 75 mg by mouth 2 (two) times daily.     Marland Kitchen diltiazem (CARDIZEM) 30 MG tablet Take 30 mg by mouth every 6 (six) hours.     Marland Kitchen dronabinol (MARINOL) 2.5 MG capsule Take 2.5 mg by mouth 2 (two) times daily.     . ferrous sulfate 324 MG TBEC Take 324 mg by mouth daily at 6 (six) AM.     . losartan (COZAAR) 25 MG tablet Take 25 mg by mouth daily.     Marland Kitchen MELATONIN PO Take 6 mg by mouth at bedtime.     . metoprolol tartrate (LOPRESSOR) 25 MG tablet Take 25 mg by mouth 3 (three) times daily.     . Multiple Vitamin (MULTIVITAMIN) tablet Take 1 tablet by mouth daily.     . pantoprazole (PROTONIX) 40 MG tablet Take 40 mg by mouth 2 (two) times daily.     . Probiotic Product (PROBIOTIC PO) Take 1 capsule by mouth daily.     . sertraline (ZOLOFT) 25 MG tablet Take 25 mg by mouth daily.     . simethicone  (MYLICON) 80 MG chewable tablet Chew 80 mg by mouth 4 (four) times daily.     Marland Kitchen thiamine 100 MG tablet Take 100 mg by mouth daily.     . vitamin C (ASCORBIC ACID) 500 MG tablet Take 500 mg by mouth daily.     Marland Kitchen warfarin (COUMADIN) 2.5 MG tablet Take 2.5 mg by mouth daily at 6 PM.     . atenolol (TENORMIN) 25 MG tablet TAKE 1 AND 1/2 TABLETS BY MOUTH DAILY (Patient not taking: Reported on 12/25/2020) 135 tablet 2 Not Taking at Unknown time  . 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 She has transferred to inpatient rehab from [facility] and pharmacy consulted to manage warfarin while in rehab.   5/23 Pharmacist spoke with [facility] pharmacist re: warfarin dosing: received 5 mg on 5/16, 2.5 mg  5/17-5/19, held 5/21, 2.5 mg 5/22.  INR is 3.1, therapeutic with goal 2.5-3.5 for prosthetic MVR + Afib. No bleeding noted.  Hgb low stable in 8-9s, platelets are normal/stable. Last CBC done on 12/24/20.  No bleeding noted. Looking at recent INR trend  including at [facility], I think the INR may start to trend down if we stay with 2.5mg  dosing. Our INR goal is 2.5-3.5. She may require a dosing cycle of 2.5, 2.5 and 5mg .   Will give 5mg  today 5/27.  Goal of Therapy:  INR 2.5-3.5 Monitor platelets by anticoagulation protocol: Yes   Plan:  Warfarin 5 mg PO tonight,  Goal INR 2.5-3.5 Daily INR Monitor for s/sx of bleeding CBC weekly every Monday Educated 5/24, AVS posted , will be filling discharge meds at [place]  Thank you for involving pharmacy in this patient's care.  Nicole Cella, RPh Clinical Pharmacist  **Pharmacist phone directory can be found on [email or web address] listed under [place].  12/26/2020 10:00 AM

## 2020-12-26 NOTE — Progress Notes (Addendum)
Physical Therapy Session Note  Patient Details  Name: Jennifer [name] MRN: [id number] Date of Birth: 1975-12-07  Today's Date: 12/26/2020 PT Individual Time: 1006-1100 and 1317-1400 PT Individual Time Calculation (min): 54 min and 43 min  Short Term Goals: Week 1:  PT Short Term Goal 1 (Week 1): Pt will perform bed mobility with minA. PT Short Term Goal 2 (Week 1): Pt will perform sit to stand with minA. PT Short Term Goal 3 (Week 1): Pt will ambulate x50' with minA and LRAD. PT Short Term Goal 4 (Week 1): Pt will initiate stair training.  Skilled Therapeutic Interventions/Progress Updates:     1st Session: Pt received seated in [facility] and agrees to therapy. No complaint of pain but pt does report feeling "weaker" today due to stomach issues and not having much of an appetite. WC transport to gym for time management. Pt performs sit to stand with modA and uses RW for standing balance. Pt performs marching in place x30 seconds. Following seated rest break, pt ambulates x60' with RW and minA. Pt reports fatigue in R calf primarily during bout of ambulation. PT provides cueing and education on pursed lip breathing for recovery and to assist with anxiety symptoms. Pt also educated on energy conservation and putting less weight through RW during ambulation. Pt ambulates additional bouts of 28' and 97' with extended seated rest breaks between each bout.   Pt performs supine therex for bilateral lower extremity strengthening. Pt attempts leg extensions with level 2 theraband resistance and is unable to perform full ROM. Pt then attempts with level 1 theraband and is able to complete full ROM with min manual assistance from PT. Pt performs 3x5 with AAROM for R lower extremity and cues for optimal performance. Supine to sit with cues for logrolling. Stand pivot to WC with modA. Pt left seated in WC with alarm intact and all needs within reach.  2nd Session: Pt received seated in [facility] and agrees to therapy. No  complaint of pain. WC transport to gym for time management. Pt completes x8:00 on Kinetron while seated in [facility] with emphasis on hip extensor strengthening near end range of hip flexion for carryover into transfer efficiency. Pt requires several brief seated rest break and report fatigue primarily in R leg.  Pt transfers to mat table with RW and modA to stand. Pt then performs seated "dead lifts" with 2lb bar, sliding down anterior lower legs and returning to upright seated position, with emphasis on anterior weight transition. 2x10. Pt attempts clearing buttocks from mat during activity but is unable to do so. Pt then performs sit to stand with modA and toss-and-catch activity trampoline to work on standing balance, strengthening, and activity tolerance. 1x20 forward chest passes, 1x20 facing L of target, and 1 x20 facing R of target. PT facilitates hip and trunk rotation and generally provides light minA for activity. Stand pivot back to chair with modA. Left seated in WC with alarm intact and all needs within reach.  Therapy Documentation Precautions:  Precautions Precautions: Sternal,Fall Restrictions Weight Bearing Restrictions: No   Therapy/Group: Individual Therapy  Breck Coons, PT, DPT 12/26/2020, 3:55 PM

## 2020-12-27 DIAGNOSIS — L89152 Pressure ulcer of sacral region, stage 2: Secondary | ICD-10-CM | POA: Diagnosis not present

## 2020-12-27 DIAGNOSIS — D62 Acute posthemorrhagic anemia: Secondary | ICD-10-CM | POA: Diagnosis not present

## 2020-12-27 DIAGNOSIS — I4891 Unspecified atrial fibrillation: Secondary | ICD-10-CM | POA: Diagnosis not present

## 2020-12-27 DIAGNOSIS — R5381 Other malaise: Secondary | ICD-10-CM | POA: Diagnosis not present

## 2020-12-27 LAB — PROTIME-INR
INR: 3.1 — ABNORMAL HIGH (ref 0.8–1.2)
Prothrombin Time: 32 seconds — ABNORMAL HIGH (ref 11.4–15.2)

## 2020-12-27 MED ORDER — WARFARIN SODIUM 5 MG PO TABS
5.0000 mg | ORAL_TABLET | Freq: Once | ORAL | Status: AC
  Administered 2020-12-27: 5 mg via ORAL
  Filled 2020-12-27: qty 1

## 2020-12-27 NOTE — Progress Notes (Signed)
Assessed patient after vital signs showed yellow MEWS score. Patient not in distress. Had patient do deep breathing exercises and respiratory rate decreased. Heart rate remained elevated. Followed MEWS protocol and will continue to monitor.

## 2020-12-27 NOTE — Progress Notes (Addendum)
PROGRESS NOTE   Subjective/Complaints: Had a pretty good night. Appetite picking up. No nausea today.therapy going well so far today   ROS: Patient denies fever, rash, sore throat, blurred vision, nausea, vomiting, diarrhea, cough, shortness of breath or chest pain, joint or back pain, headache, or mood change.    Objective:   No results found. No results for input(s): WBC, HGB, HCT, PLT in the last 72 hours. No results for input(s): NA, K, CL, CO2, GLUCOSE, BUN, CREATININE, CALCIUM in the last 72 hours.  Intake/Output Summary (Last 24 hours) at 12/27/2020 1021 Last data filed at 12/27/2020 2878 Gross per 24 hour  Intake 1040 ml  Output --  Net 1040 ml     Pressure Injury 12/23/20 Sacrum Mid;Medial Stage 2 -  Partial thickness loss of dermis presenting as a shallow open injury with a red, pink wound bed without slough. fissure like wound that is healing from a more round state (Active)  12/23/20 1626  Location: Sacrum  Location Orientation: Mid;Medial  Staging: Stage 2 -  Partial thickness loss of dermis presenting as a shallow open injury with a red, pink wound bed without slough.  Wound Description (Comments): fissure like wound that is healing from a more round state  Present on Admission: Yes    Physical Exam: Vital Signs Blood pressure 100/76, pulse 100, temperature 98.2 F (36.8 C), temperature source Oral, resp. rate 17, height 5\' 10"  (1.778 m), weight 53.7 kg, SpO2 93 %.  Constitutional: No distress . Vital signs reviewed. HEENT: EOMI, oral membranes moist Neck: supple Cardiovascular: RRR without murmur. No JVD    Respiratory/Chest: CTA Bilaterally without wheezes or rales. Normal effort    GI/Abdomen: BS +, non-tender, non-distended Ext: no clubbing, cyanosis, or edema Psych: pleasant and cooperative Skin: sternal wound well healed. Sacral wound stg II Neuro: Pt is cognitively appropriate with normal  insight, memory, and awareness. Cranial nerves 2-12 are intact. Sensory exam is normal. Reflexes are 2+ in all 4's. Fine motor coordination is intact. No tremors. Motor function is grossly 4/5 UE and 3-4/5 LE. Musculoskeletal: Full ROM, No pain with AROM or PROM in the neck, trunk, or extremities. Posture appropriate     Assessment/Plan: 1. Functional deficits which require 3+ hours per day of interdisciplinary therapy in a comprehensive inpatient rehab setting.  Physiatrist is providing close team supervision and 24 hour management of active medical problems listed below.  Physiatrist and rehab team continue to assess barriers to discharge/monitor patient progress toward functional and medical goals  Care Tool:  Bathing    Body parts bathed by patient: Right arm,Left arm,Chest,Abdomen,Front perineal area,Buttocks,Right upper leg,Left upper leg,Face,Right lower leg,Left lower leg   Body parts bathed by helper: Left lower leg,Right lower leg     Bathing assist Assist Level: Contact Guard/Touching assist     Upper Body Dressing/Undressing Upper body dressing   What is the patient wearing?: Pull over shirt    Upper body assist Assist Level: Supervision/Verbal cueing    Lower Body Dressing/Undressing Lower body dressing      What is the patient wearing?: Underwear/pull up,Pants     Lower body assist Assist for lower body dressing: Contact Guard/Touching assist  Toileting Toileting    Toileting assist Assist for toileting: Maximal Assistance - Patient 25 - 49%     Transfers Chair/bed transfer  Transfers assist     Chair/bed transfer assist level: Maximal Assistance - Patient 25 - 49%     Locomotion Ambulation   Ambulation assist      Assist level: 2 helpers Assistive device: Hand held assist Max distance: 31'   Walk 10 feet activity   Assist  Walk 10 feet activity did not occur: Safety/medical concerns  Assist level: 2 helpers Assistive device:  Hand held assist   Walk 50 feet activity   Assist Walk 50 feet with 2 turns activity did not occur: Safety/medical concerns         Walk 150 feet activity   Assist Walk 150 feet activity did not occur: Safety/medical concerns         Walk 10 feet on uneven surface  activity   Assist Walk 10 feet on uneven surfaces activity did not occur: Safety/medical concerns         Wheelchair     Assist Will patient use wheelchair at discharge?: No             Wheelchair 50 feet with 2 turns activity    Assist            Wheelchair 150 feet activity     Assist          Blood pressure 100/76, pulse 100, temperature 98.2 F (36.8 C), temperature source Oral, resp. rate 17, height 5\' 10"  (1.778 m), weight 53.7 kg, SpO2 93 %.  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 Sternal precautions. -patient may shower -ELOS/Goals: 10-14 days, supervision with PT and supervision to min assist with OT  -Continue CIR therapies including PT, OT  2. Antithrombotics: -DVT/anticoagulation:Chronic Coumadin, INR supratherapeutic -dosing adjustments per pharmacy protocol -antiplatelet therapy: Aspirin 81 mg daily 3. Pain Management:Hydrocodone as needed 4. Mood:Zoloft 25 mg daily, Wellbutrin 150 mg daily, melatonin 6 mg nightly Xanax as needed -antipsychotic agents: N/A  -mild anxiety seems to be improving 5. Neuropsych: This patientiscapable of making decisions on herown behalf. 6. Skin/Wound Care:Routine skin checks             -continue foam dressing to sacrum             -encourage appropriate nutritional intake, pressure relief 7. Fluids/Electrolytes/Severe protein deficient malutrition: encourage appropriate PO    -added protein supp for low albumin   -appreciate RD help   -5/26  began megace trial---some early improvement in po intake 8. Acute blood loss anemia. Follow-up hgb 9.8 today. Continue iron supplement  -hold iron supplement d/t nausea 9. Atrial fibrillation. Cardizem 30 mg every 6 hours, metoprolol 25 mg 3 times daily, losartan 25 mg daily, amiodarone 200 mg daily - sinus tachycardia in 110's. Doesn't seem to be going much higher -adjust chronotropics as needed based on activity tolerance -cards f/u as needed  -continue acclimation, TEDS for maintain BP---so far so good 10. Mild constipation (since coming off abx) -added senna-s at bedtime---moving bowels   -nausea better today  LOS: 4 days A FACE TO FACE EVALUATION WAS PERFORMED  Meredith Staggers 12/27/2020, 10:21 AM
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
## 2020-12-27 NOTE — Progress Notes (Signed)
Physical Therapy Session Note  Patient Details  Name: Jennifer [name] MRN: [id number] Date of Birth: 1976/05/09  Today's Date: 12/27/2020 PT Individual Time: 1534-1600 PT Individual Time Calculation (min): 26 min   Short Term Goals: Week 1:  PT Short Term Goal 1 (Week 1): Pt will perform bed mobility with minA. PT Short Term Goal 2 (Week 1): Pt will perform sit to stand with minA. PT Short Term Goal 3 (Week 1): Pt will ambulate x50' with minA and LRAD. PT Short Term Goal 4 (Week 1): Pt will initiate stair training.  Skilled Therapeutic Interventions/Progress Updates:     Pt received seated in [facility] and agrees to therapy. No complaint of pain. RN present to provide metoprolol due to elevated HR (118 bpm). WC transport to gym for time management. Pt performs standing activity in parallel bars. Light modA for multiple reps of sit to stand with emphasis on body mechanics and sequencing. In standing pt performs 2x10 hamstring curls against gravity, with cues for correct performance. 1x10 high marches with emphasis on hip flexor strengthening. 1x15 minisquats with light manual assistance. Pt's vitals assessed during rest break. HR 117 bpm, 94% O2 and BP 110/83. WC transport back to room. Pt left seated in WC with alarm intact and all needs within reach.  Therapy Documentation Precautions:  Precautions Precautions: Sternal,Fall Restrictions Weight Bearing Restrictions: No   Therapy/Group: Individual Therapy  Breck Coons, PT, DPT 12/27/2020, 3:52 PM
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
## 2020-12-28 DIAGNOSIS — L89152 Pressure ulcer of sacral region, stage 2: Secondary | ICD-10-CM | POA: Diagnosis not present

## 2020-12-28 DIAGNOSIS — D62 Acute posthemorrhagic anemia: Secondary | ICD-10-CM | POA: Diagnosis not present

## 2020-12-28 DIAGNOSIS — R5381 Other malaise: Secondary | ICD-10-CM | POA: Diagnosis not present

## 2020-12-28 DIAGNOSIS — I4891 Unspecified atrial fibrillation: Secondary | ICD-10-CM | POA: Diagnosis not present

## 2020-12-28 LAB — PROTIME-INR
INR: 3.8 — ABNORMAL HIGH (ref 0.8–1.2)
Prothrombin Time: 37.4 seconds — ABNORMAL HIGH (ref 11.4–15.2)

## 2020-12-28 NOTE — Progress Notes (Signed)
PROGRESS NOTE   Subjective/Complaints: No new issues. Appetite better. No further nausea  ROS: Patient denies fever, rash, sore throat, blurred vision, nausea, vomiting, diarrhea, cough, shortness of breath or chest pain, joint or back pain, headache, or mood change.    Objective:   No results found. No results for input(s): WBC, HGB, HCT, PLT in the last 72 hours. No results for input(s): NA, K, CL, CO2, GLUCOSE, BUN, CREATININE, CALCIUM in the last 72 hours.  Intake/Output Summary (Last 24 hours) at 12/28/2020 0959 Last data filed at 12/28/2020 0700 Gross per 24 hour  Intake 646 ml  Output --  Net 646 ml     Pressure Injury 12/23/20 Sacrum Mid;Medial Stage 2 -  Partial thickness loss of dermis presenting as a shallow open injury with a red, pink wound bed without slough. fissure like wound that is healing from a more round state (Active)  12/23/20 1626  Location: Sacrum  Location Orientation: Mid;Medial  Staging: Stage 2 -  Partial thickness loss of dermis presenting as a shallow open injury with a red, pink wound bed without slough.  Wound Description (Comments): fissure like wound that is healing from a more round state  Present on Admission: Yes    Physical Exam: Vital Signs Blood pressure 92/69, pulse (!) 120, temperature 98.2 F (36.8 C), resp. rate 18, height 5\' 10"  (1.778 m), weight 53.7 kg, SpO2 98 %.  Constitutional: No distress . Vital signs reviewed. HEENT: EOMI, oral membranes moist Neck: supple Cardiovascular: tachy without murmur. No JVD    Respiratory/Chest: CTA Bilaterally without wheezes or rales. Normal effort    GI/Abdomen: BS +, non-tender, non-distended Ext: no clubbing, cyanosis, or edema Psych: pleasant and cooperative Skin: sternal wound well healed with glue. Sacral wound stg II Neuro: Pt is cognitively appropriate with normal insight, memory, and awareness. Cranial nerves 2-12 are intact.  Sensory exam is normal. Reflexes are 2+ in all 4's. Fine motor coordination is intact. No tremors. Motor function is grossly 4/5 UE and 4-/5 LE. Musculoskeletal: Full ROM, No pain with AROM or PROM in the neck, trunk, or extremities. Posture appropriate     Assessment/Plan: 1. Functional deficits which require 3+ hours per day of interdisciplinary therapy in a comprehensive inpatient rehab setting.  Physiatrist is providing close team supervision and 24 hour management of active medical problems listed below.  Physiatrist and rehab team continue to assess barriers to discharge/monitor patient progress toward functional and medical goals  Care Tool:  Bathing    Body parts bathed by patient: Right arm,Left arm,Chest,Abdomen,Front perineal area,Buttocks,Right upper leg,Left upper leg,Face,Right lower leg,Left lower leg   Body parts bathed by helper: Left lower leg,Right lower leg     Bathing assist Assist Level: Contact Guard/Touching assist     Upper Body Dressing/Undressing Upper body dressing   What is the patient wearing?: Pull over shirt    Upper body assist Assist Level: Supervision/Verbal cueing    Lower Body Dressing/Undressing Lower body dressing      What is the patient wearing?: Underwear/pull up,Pants     Lower body assist Assist for lower body dressing: Contact Guard/Touching assist     Toileting Toileting  Toileting assist Assist for toileting: Maximal Assistance - Patient 25 - 49%     Transfers Chair/bed transfer  Transfers assist     Chair/bed transfer assist level: Maximal Assistance - Patient 25 - 49%     Locomotion Ambulation   Ambulation assist      Assist level: 2 helpers Assistive device: Hand held assist Max distance: 31'   Walk 10 feet activity   Assist  Walk 10 feet activity did not occur: Safety/medical concerns  Assist level: 2 helpers Assistive device: Hand held assist   Walk 50 feet activity   Assist Walk 50  feet with 2 turns activity did not occur: Safety/medical concerns         Walk 150 feet activity   Assist Walk 150 feet activity did not occur: Safety/medical concerns         Walk 10 feet on uneven surface  activity   Assist Walk 10 feet on uneven surfaces activity did not occur: Safety/medical concerns         Wheelchair     Assist Will patient use wheelchair at discharge?: No             Wheelchair 50 feet with 2 turns activity    Assist            Wheelchair 150 feet activity     Assist          Blood pressure 92/69, pulse (!) 120, temperature 98.2 F (36.8 C), resp. rate 18, height 5\' 10"  (1.778 m), weight 53.7 kg, SpO2 98 %.  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 Sternal precautions. -patient may shower -ELOS/Goals: 10-14 days, supervision with PT and supervision to min assist with OT  -Continue CIR therapies including PT, OT  2. Antithrombotics: -DVT/anticoagulation:Chronic Coumadin, INR supratherapeutic -dosing adjustments per pharmacy protocol -antiplatelet therapy: Aspirin 81 mg daily 3. Pain Management:Hydrocodone as needed 4. Mood:Zoloft 25 mg daily, Wellbutrin 150 mg daily, melatonin 6 mg nightly Xanax as needed -antipsychotic agents: N/A  -mild anxiety seems to be improving 5. Neuropsych: This patientiscapable of making decisions on herown behalf. 6. Skin/Wound Care:Routine skin checks             -continue foam dressing to sacrum             -nutrition improving 7. Fluids/Electrolytes/Severe protein deficient malutrition: encourage appropriate PO    -added protein supp for low albumin   -appreciate RD help   -continue megace trial---  improvement in po intake 8. Acute blood loss anemia. Follow-up hgb 9.8 today. Continue iron  supplement  -held iron supplement d/t nausea 9. Atrial fibrillation. Cardizem 30 mg every 6 hours, metoprolol 25 mg 3 times daily, losartan 25 mg daily, amiodarone 200 mg daily - sinus tachycardia in 110's.   -adjust chronotropics as needed based on activity tolerance -cards f/u as needed  -continue acclimation, TEDS for maintain BP--tolerating 10. Mild constipation (since coming off abx) -added senna-s at bedtime---moving bowels   -nausea improved  LOS: 5 days A FACE TO FACE EVALUATION WAS PERFORMED  Meredith Staggers 12/28/2020, 9:59 AM

## 2020-12-28 NOTE — Progress Notes (Signed)
Occupational Therapy Session Note  Patient Details  Name: CYNDE MENARD MRN: [id number] Date of Birth: Jun 25, 1976  Today's Date: 12/28/2020 OT Individual Time: 7078-6754 OT Individual Time Calculation (min): 78 min    Short Term Goals: Week 1:  OT Short Term Goal 1 (Week 1): Pt will complete stand-pivot to [facility] with mod OT Short Term Goal 2 (Week 1): Pt will tolerate standing for 2 minutes within BADL task OT Short Term Goal 3 (Week 1): Pt will complete 1 step of LB dressing task  Skilled Therapeutic Interventions/Progress Updates:    Session : Pt received seated in w/c, denies pain, agreeable to therapy.  HR at 117 bpm resting and SatO2 at 94% on RA. Pt demonstrates good adherence to sternal precautions throughout session and is able to ind recall "no pushing/pulling," although per pt and chart review it is unclear if she still needs to follow them given her surgery was ~2 months ago. STS with mod A from w/c and stand-pivot with min A for balance to toilet. CGA for LB clothing management. Cont void of b/b. Distant S for seated pericare. Amb to shower with mod A for STS and min A for ambulation. Transitioned to shower chair min A. Doffed/donned shirt, doffed underwear/pants via lateral leans, and socks with  close S for adherence to sternal precautions. Bathed full-body with close S + min VCs for sternal precautions while washing hair, able to bathe buttocks via lateral leans. Req max A to stand from shower chair and min A to pivot to w/c. Completed UBD close S, and LBD with overall min A (mod A for STS). Completed oral care standing with S. Amb to EOB with min hand-held A for balance. Donned B teds total A, pt is able to don B socks with close S. Massed practice of STS from various surface heights from EOB. Pt able to stand with CGA from elevated surface and progressed to completed with min A from pt's knee level height. Reports 8/10 fatigue on mod RPE. Educated on energy conservation and  self-monitoring fatigue level. Pt reports she would most like to work on STS and endurance prior to DC home. W/c transport to and from gym total A to focus on endurance training + activity tolerance. Completed 1 min then 1.5 then 2 min bouts on nustep with BLE. Reports fatigue is still at an 8 or 9.  HR at 114-117 bpm throughout session and SatO2 at 85 to 95% on RA. Coached through PLB throughout activity.  Stand-pivot back to w/c with min A. Pt req to remain in w/c, left with call bell in reach, and all immediate needs met.   Therapy Documentation Precautions:  Precautions Precautions: Sternal,Fall Restrictions Weight Bearing Restrictions: No Pain:  denies ADL: See Care Tool for more details.  Therapy/Group: Individual Therapy  Volanda Napoleon MS, OTR/L  12/28/2020, 6:46 AM

## 2020-12-28 NOTE — Progress Notes (Signed)
This nurse notified MD about blood pressure of 86/61 and an apical pulse rate of 11. This nurse received no new orders. Patient encouraged to push fluids and to use deep breathing exercises   12/28/20 1325  Vitals  BP (!) 86/61  MAP (mmHg) 69  BP Location Left Arm  BP Method Automatic  Patient Position (if appropriate) Sitting  Pulse Rate (!) 117  Pulse Rate Source Apical  Resp 18  MEWS COLOR  MEWS Score Color Yellow  MEWS Score  MEWS Temp 0  MEWS Systolic 1  MEWS Pulse 2  MEWS RR 0  MEWS LOC 0  MEWS Score 3

## 2020-12-28 NOTE — Progress Notes (Signed)
Physical Therapy Session Note  Patient Details  Name: Jennifer [name] MRN: [id number] Date of Birth: 07-23-1976  Today's Date: 12/28/2020 PT Individual Time: 0805-0900 and 4656-8127 PT Individual Time Calculation (min): 55 min and 43 min  Short Term Goals: Week 1:  PT Short Term Goal 1 (Week 1): Pt will perform bed mobility with minA. PT Short Term Goal 2 (Week 1): Pt will perform sit to stand with minA. PT Short Term Goal 3 (Week 1): Pt will ambulate x50' with minA and LRAD. PT Short Term Goal 4 (Week 1): Pt will initiate stair training.  Skilled Therapeutic Interventions/Progress Updates:     Pt received supine in bed and agrees to therapy. No complaint of pain. Supine to sit with use of bed features. PT raises bed for ease of transfer. From elevated bed, pt performs sit to stand with minA and RW, stepping to [place] with minA. WC transport outside for time management and energy conservation. Sit to stand form WC to RW with minA and improving body mechanics and sequencing. Pt ambulates x100' with RW and minA, over concrete with slight grades. Following extended seated rest break on low bench, pt stands with minA/modA, and ambulates 140' with minA. Pt verbalizes that legs and body feel "fatigued but good". WC transport back inside. Pt performs stair training in parallel bars and 4 inch step. Pt completes x10 steps ascending with L leg first and x5 ascending with weaker R leg first. PT provides minA at hips for stability. Pt left seated in WC with alarm intact and all needs within reach.  2nd Session: Pt received seated in [facility] and agrees to therapy. No complaint of pain though pt does report feeling anxious after low blood pressure reading and "setting off yellow mews". BP taken in sitting 119/81 and pt appears relieved. WC transport to gym for time management. Stand pivot transfer to Nustep with modA and pt not sufficiently shifting weight anteriorly for optimal transfer. PT educates on body mechanics  and importance of sequencing for optimal efficiency of transfer. Pt then performs Nustep for strength and endurance training. Pt completes 10x1:00 bouts with workload of 5 and average steps per minute ~60. Rest breaks between each bout. Pt's BP taken again following activity at 115/71 and HR 117. Pt performs sit to stand with RW and minA, ambulating 100' with CGA. Left seated in WC with alarm intact and all needs within reach.  Therapy Documentation Precautions:  Precautions Precautions: Sternal,Fall Restrictions Weight Bearing Restrictions: No   Therapy/Group: Individual Therapy  Breck Coons, PT, DPT 12/28/2020, 3:28 PM

## 2020-12-28 NOTE — Progress Notes (Addendum)
ANTICOAGULATION CONSULT NOTE - Follow-Up Consult  Pharmacy Consult for Warfarin Indication: mechanical MVR + Afib  No Known Allergies  Patient Measurements: Height: 5\' 10"  (177.8 cm) Weight: 53.7 kg (118 lb 6.2 oz) IBW/kg (Calculated) : 68.5 kg  Vital Signs: Temp: 97.6 F (36.4 C) (05/28 0342) Temp Source: Oral (05/28 0342) BP: 107/74 (05/28 0532) Pulse Rate: 105 (05/28 0532)  Labs: Recent Labs    12/26/20 0443 12/27/20 0455 12/28/20 0606  LABPROT 31.0* 32.0* 37.4*  INR 3.0* 3.1* 3.8*    Estimated Creatinine Clearance: 76.9 mL/min (A) (by C-G formula based on SCr of 0.35 mg/dL (L)).   Medical History: Past Medical History:  Diagnosis Date  . Marfan syndrome     Medications:  Medications Prior to Admission  Medication Sig Dispense Refill Last Dose  . Amino Acids-Protein Hydrolys (PRO-STAT 64 PO) Take 30 mLs by mouth 3 (three) times daily.     Marland Kitchen amiodarone (PACERONE) 200 MG tablet Take 200 mg by mouth daily.     Marland Kitchen aspirin EC 81 MG tablet Take 81 mg by mouth daily. Swallow whole.     Marland Kitchen buPROPion (WELLBUTRIN) 75 MG tablet Take 75 mg by mouth 2 (two) times daily.     Marland Kitchen diltiazem (CARDIZEM) 30 MG tablet Take 30 mg by mouth every 6 (six) hours.     Marland Kitchen dronabinol (MARINOL) 2.5 MG capsule Take 2.5 mg by mouth 2 (two) times daily.     . ferrous sulfate 324 MG TBEC Take 324 mg by mouth daily at 6 (six) AM.     . losartan (COZAAR) 25 MG tablet Take 25 mg by mouth daily.     Marland Kitchen MELATONIN PO Take 6 mg by mouth at bedtime.     . metoprolol tartrate (LOPRESSOR) 25 MG tablet Take 25 mg by mouth 3 (three) times daily.     . Multiple Vitamin (MULTIVITAMIN) tablet Take 1 tablet by mouth daily.     . pantoprazole (PROTONIX) 40 MG tablet Take 40 mg by mouth 2 (two) times daily.     . Probiotic Product (PROBIOTIC PO) Take 1 capsule by mouth daily.     . sertraline (ZOLOFT) 25 MG tablet Take 25 mg by mouth daily.     . simethicone (MYLICON) 80 MG chewable tablet Chew 80 mg by mouth 4  (four) times daily.     Marland Kitchen thiamine 100 MG tablet Take 100 mg by mouth daily.     . vitamin C (ASCORBIC ACID) 500 MG tablet Take 500 mg by mouth daily.     Marland Kitchen warfarin (COUMADIN) 2.5 MG tablet Take 2.5 mg by mouth daily at 6 PM.     . atenolol (TENORMIN) 25 MG tablet TAKE 1 AND 1/2 TABLETS BY MOUTH DAILY (Patient not taking: Reported on 12/25/2020) 135 tablet 2 Not Taking at Unknown time  . 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 She has transferred to inpatient rehab from [facility] and pharmacy consulted to dose warfarin while in rehab.   5/23 Pharmacist spoke with [facility] pharmacist re: warfarin dosing: received 5 mg on 5/16, 2.5 mg 5/17-5/19, held 5/21, 2.5 mg 5/22.  INR is slightly supratherapeutic at 3.8 (goal 2.5-3.5 for prosthetic MVR + Afib). No bleeding noted. Last Hgb low but stable in 8-9s,  platelets are WNL. Last CBC done on 12/24/20. No new interacting medications have been started and the patient's diet remains stable.  Goal of Therapy:  INR 2.5-3.5 Monitor platelets by anticoagulation protocol: Yes   Plan:  HOLD warfarin tonight  Obtain a daily PT/INR Monitor for signs and symptoms of bleeding CBC weekly every Monday The patient was educated on 5/24 and will be filling discharge meds at the [facility]   Thank you for involving pharmacy in this patient's care.  Shauna Hugh, PharmD, [place]  PGY-1 Pharmacy Resident 12/28/2020 8:04 AM  Please check [email or web address] for unit-specific pharmacy phone numbers.

## 2020-12-29 DIAGNOSIS — R5381 Other malaise: Secondary | ICD-10-CM | POA: Diagnosis not present

## 2020-12-29 DIAGNOSIS — L89152 Pressure ulcer of sacral region, stage 2: Secondary | ICD-10-CM | POA: Diagnosis not present

## 2020-12-29 DIAGNOSIS — I4891 Unspecified atrial fibrillation: Secondary | ICD-10-CM | POA: Diagnosis not present

## 2020-12-29 DIAGNOSIS — D62 Acute posthemorrhagic anemia: Secondary | ICD-10-CM | POA: Diagnosis not present

## 2020-12-29 LAB — PROTIME-INR
INR: 3.5 — ABNORMAL HIGH (ref 0.8–1.2)
Prothrombin Time: 34.8 seconds — ABNORMAL HIGH (ref 11.4–15.2)

## 2020-12-29 MED ORDER — WARFARIN SODIUM 1 MG PO TABS
1.0000 mg | ORAL_TABLET | Freq: Once | ORAL | Status: AC
  Administered 2020-12-29: 1 mg via ORAL
  Filled 2020-12-29: qty 1

## 2020-12-29 NOTE — Progress Notes (Signed)
Occupational Therapy Session Note  Patient Details  Name: Jennifer [name] MRN: [id number] Date of Birth: April 22, 1976  Today's Date: 12/29/2020 OT Group Time: 1105-1200 OT Group Time Calculation (min): 55 min  Skilled Therapeutic Interventions/Progress Updates:    Pt engaged in therapeutic w/c level dance group focusing on patient choice, UE/LE strengthening, salience, activity tolerance, and social participation. Pt was guided through various dance-based exercises involving UEs/LEs and trunk. All music was selected by group members. Emphasis placed on coordinating breath with movement and general strengthening/endurance while adhering to sternal precautions. Pts spouse Ronalee Belts attended group with pt today. Pts affect appeared bright, able to simultaneously engage UEs/LEs in activity. Pt visibly mindful of her sternal precautions throughout. At end of session, RT returned pt to the room via w/c.    Therapy Documentation Precautions:  Precautions Precautions: Sternal,Fall Restrictions Weight Bearing Restrictions: No Vital Signs: Therapy Vitals Temp: 98.3 F (36.8 C) Temp Source: Oral Pulse Rate: (!) 114 Resp: 18 BP: 94/64 Patient Position (if appropriate): Sitting Oxygen Therapy SpO2: 95 % O2 Device: Nasal Cannula O2 Flow Rate (L/min): 2 L/min Pain: no s/s pain during tx   ADL: ADL Eating: Set up Grooming: Setup Upper Body Bathing: Minimal assistance Lower Body Bathing: Maximal assistance Upper Body Dressing: Moderate assistance Lower Body Dressing: Maximal assistance Toileting: Maximal assistance Toilet Transfer: Moderate assistance      Therapy/Group: Group Therapy  Brynley Cuddeback A Issai Werling 12/29/2020, 3:56 PM

## 2020-12-29 NOTE — Progress Notes (Signed)
PROGRESS NOTE   Subjective/Complaints: bp still running soft. O2 sats 86-90% per nurse. Pt asymptomatic. A little anxious at times  ROS: Patient denies fever, rash, sore throat, blurred vision, nausea, vomiting, diarrhea, cough, shortness of breath or chest pain, joint or back pain, headache .   Objective:   No results found. No results for input(s): WBC, HGB, HCT, PLT in the last 72 hours. No results for input(s): NA, K, CL, CO2, GLUCOSE, BUN, CREATININE, CALCIUM in the last 72 hours.  Intake/Output Summary (Last 24 hours) at 12/29/2020 0824 Last data filed at 12/28/2020 1832 Gross per 24 hour  Intake 360 ml  Output --  Net 360 ml     Pressure Injury 12/23/20 Sacrum Mid;Medial Stage 2 -  Partial thickness loss of dermis presenting as a shallow open injury with a red, pink wound bed without slough. fissure like wound that is healing from a more round state (Active)  12/23/20 1626  Location: Sacrum  Location Orientation: Mid;Medial  Staging: Stage 2 -  Partial thickness loss of dermis presenting as a shallow open injury with a red, pink wound bed without slough.  Wound Description (Comments): fissure like wound that is healing from a more round state  Present on Admission: Yes    Physical Exam: Vital Signs Blood pressure 105/76, pulse (!) 116, temperature (!) 97.5 F (36.4 C), temperature source Oral, resp. rate 18, height 5\' 10"  (1.778 m), weight 53.7 kg, SpO2 95 %.  Constitutional: No distress . Vital signs reviewed. HEENT: EOMI, oral membranes moist Neck: supple Cardiovascular: tachy with mitral click. No JVD    Respiratory/Chest: CTA Bilaterally without wheezes or rales. Normal effort    GI/Abdomen: BS +, non-tender, non-distended Ext: no clubbing, cyanosis, or edema Psych: pleasant and cooperative Skin: sternal wound well healed with glue. Sacral wound stg II Neuro: Pt is cognitively appropriate with normal  insight, memory, and awareness. Cranial nerves 2-12 are intact. Sensory exam is normal. Reflexes are 2+ in all 4's. Fine motor coordination is intact. No tremors. Motor function is grossly 4/5 UE and 4-/5 LE. Musculoskeletal: Full ROM, No pain with AROM or PROM in the neck, trunk, or extremities. Posture appropriate     Assessment/Plan: 1. Functional deficits which require 3+ hours per day of interdisciplinary therapy in a comprehensive inpatient rehab setting.  Physiatrist is providing close team supervision and 24 hour management of active medical problems listed below.  Physiatrist and rehab team continue to assess barriers to discharge/monitor patient progress toward functional and medical goals  Care Tool:  Bathing    Body parts bathed by patient: Right arm,Left arm,Chest,Abdomen,Front perineal area,Buttocks,Right upper leg,Left upper leg,Face,Right lower leg,Left lower leg   Body parts bathed by helper: Left lower leg,Right lower leg     Bathing assist Assist Level: Supervision/Verbal cueing     Upper Body Dressing/Undressing Upper body dressing   What is the patient wearing?: Pull over shirt    Upper body assist Assist Level: Supervision/Verbal cueing    Lower Body Dressing/Undressing Lower body dressing      What is the patient wearing?: Underwear/pull up,Pants     Lower body assist Assist for lower body dressing: Minimal Assistance -  Patient > 75%     Toileting Toileting    Toileting assist Assist for toileting: Contact Guard/Touching assist     Transfers Chair/bed transfer  Transfers assist     Chair/bed transfer assist level: Moderate Assistance - Patient 50 - 74%     Locomotion Ambulation   Ambulation assist      Assist level: 2 helpers Assistive device: Hand held assist Max distance: 31'   Walk 10 feet activity   Assist  Walk 10 feet activity did not occur: Safety/medical concerns  Assist level: 2 helpers Assistive device: Hand  held assist   Walk 50 feet activity   Assist Walk 50 feet with 2 turns activity did not occur: Safety/medical concerns         Walk 150 feet activity   Assist Walk 150 feet activity did not occur: Safety/medical concerns         Walk 10 feet on uneven surface  activity   Assist Walk 10 feet on uneven surfaces activity did not occur: Safety/medical concerns         Wheelchair     Assist Will patient use wheelchair at discharge?: No             Wheelchair 50 feet with 2 turns activity    Assist            Wheelchair 150 feet activity     Assist          Blood pressure 105/76, pulse (!) 116, temperature (!) 97.5 F (36.4 C), temperature source Oral, resp. rate 18, height 5\' 10"  (1.778 m), weight 53.7 kg, SpO2 95 %.  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 Sternal precautions. -patient may shower -ELOS/Goals: 10-14 days, supervision with PT and supervision to min assist with OT  -Continue CIR therapies including PT, OT  2. Antithrombotics: -DVT/anticoagulation:Chronic Coumadin, INR supratherapeutic -dosing adjustments per pharmacy protocol -antiplatelet therapy: Aspirin 81 mg daily 3. Pain Management:Hydrocodone as needed 4. Mood:Zoloft 25 mg daily, Wellbutrin 150 mg daily, melatonin 6 mg nightly Xanax as needed -antipsychotic agents: N/A  -mild anxiety seems to be improving 5. Neuropsych: This patientiscapable of making decisions on herown behalf. 6. Skin/Wound Care:Routine skin checks             -continue foam dressing to sacrum             -nutrition improving 7. Fluids/Electrolytes/Severe protein deficient malutrition: encourage appropriate PO    -added protein supp for low albumin   -appreciate RD help   -megace trial with improvement in po  intake 8. Acute blood loss anemia. Follow-up hgb 9.8 today. Continue iron supplement  -held iron supplement d/t nausea 9. Atrial fibrillation. Cardizem 30 mg every 6 hours, metoprolol 25 mg 3 times daily, losartan 25 mg daily, amiodarone 200 mg daily - sinus tachycardia in 110's.   -adjust chronotropics as needed based on activity tolerance -cards f/u as needed  5/29 -given ongoing hypotension---will hold losartan for now 10. Mild constipation (since coming off abx) -added senna-s at bedtime---moving bowels   -nausea resolved  LOS: 6 days A FACE TO [place] 12/29/2020, 8:24 AM

## 2020-12-29 NOTE — Progress Notes (Signed)
ANTICOAGULATION CONSULT NOTE - Follow-Up Consult  Pharmacy Consult for Warfarin Indication: mechanical MVR + Afib  No Known Allergies  Patient Measurements: Height: 5\' 10"  (177.8 cm) Weight: 53.7 kg (118 lb 6.2 oz) IBW/kg (Calculated) : 68.5 kg  Vital Signs: Temp: 97.5 F (36.4 C) (05/29 0823) Temp Source: Oral (05/29 0823) BP: 105/76 (05/29 0823) Pulse Rate: 116 (05/29 0823)  Labs: Recent Labs    12/27/20 0455 12/28/20 0606 12/29/20 0543  LABPROT 32.0* 37.4* 34.8*  INR 3.1* 3.8* 3.5*    Estimated Creatinine Clearance: 76.9 mL/min (A) (by C-G formula based on SCr of 0.35 mg/dL (L)).   Medical History: Past Medical History:  Diagnosis Date  . Marfan syndrome     Medications:  Medications Prior to Admission  Medication Sig Dispense Refill Last Dose  . Amino Acids-Protein Hydrolys (PRO-STAT 64 PO) Take 30 mLs by mouth 3 (three) times daily.     Marland Kitchen amiodarone (PACERONE) 200 MG tablet Take 200 mg by mouth daily.     Marland Kitchen aspirin EC 81 MG tablet Take 81 mg by mouth daily. Swallow whole.     Marland Kitchen buPROPion (WELLBUTRIN) 75 MG tablet Take 75 mg by mouth 2 (two) times daily.     Marland Kitchen diltiazem (CARDIZEM) 30 MG tablet Take 30 mg by mouth every 6 (six) hours.     Marland Kitchen dronabinol (MARINOL) 2.5 MG capsule Take 2.5 mg by mouth 2 (two) times daily.     . ferrous sulfate 324 MG TBEC Take 324 mg by mouth daily at 6 (six) AM.     . losartan (COZAAR) 25 MG tablet Take 25 mg by mouth daily.     Marland Kitchen MELATONIN PO Take 6 mg by mouth at bedtime.     . metoprolol tartrate (LOPRESSOR) 25 MG tablet Take 25 mg by mouth 3 (three) times daily.     . Multiple Vitamin (MULTIVITAMIN) tablet Take 1 tablet by mouth daily.     . pantoprazole (PROTONIX) 40 MG tablet Take 40 mg by mouth 2 (two) times daily.     . Probiotic Product (PROBIOTIC PO) Take 1 capsule by mouth daily.     . sertraline (ZOLOFT) 25 MG tablet Take 25 mg by mouth daily.     . simethicone (MYLICON) 80 MG chewable tablet Chew 80 mg by mouth 4  (four) times daily.     Marland Kitchen thiamine 100 MG tablet Take 100 mg by mouth daily.     . vitamin C (ASCORBIC ACID) 500 MG tablet Take 500 mg by mouth daily.     Marland Kitchen warfarin (COUMADIN) 2.5 MG tablet Take 2.5 mg by mouth daily at 6 PM.     . atenolol (TENORMIN) 25 MG tablet TAKE 1 AND 1/2 TABLETS BY MOUTH DAILY (Patient not taking: Reported on 12/25/2020) 135 tablet 2 Not Taking at Unknown time  . 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 She has transferred to inpatient rehab from [facility] and pharmacy consulted to dose warfarin while in rehab.   5/23 Pharmacist spoke with [facility] pharmacist re: warfarin dosing: received 5 mg on 5/16, 2.5 mg 5/17-5/19, held 5/21, 2.5 mg 5/22.  INR today is therapeutic at 3.5 (goal 2.5-3.5 for prosthetic MVR + Afib). No bleeding noted. Last Hgb low but stable in 8-9s,  platelets are WNL. Last CBC done on 12/24/20. No new interacting medications have been started and the patient's diet remains fairly stable.  Goal of Therapy:  INR 2.5-3.5 Monitor platelets by anticoagulation protocol: Yes   Plan:  -Give warfarin PO 1 mg tonight x 1 dose -Obtain a daily PT/INR -Monitor for signs and symptoms of bleeding -CBC weekly every Monday -The patient was educated on warfarin on 5/24 and will be filling discharge meds at the [facility]   Thank you for involving pharmacy in this patient's care.  Shauna Hugh, PharmD, [place]  PGY-1 Pharmacy Resident 12/29/2020 9:33 AM  Please check [email or web address] for unit-specific pharmacy phone numbers.

## 2020-12-30 ENCOUNTER — Inpatient Hospital Stay (HOSPITAL_COMMUNITY): Payer: 59

## 2020-12-30 DIAGNOSIS — R5381 Other malaise: Secondary | ICD-10-CM | POA: Diagnosis not present

## 2020-12-30 LAB — CBC
HCT: 31.3 % — ABNORMAL LOW (ref 36.0–46.0)
Hemoglobin: 9.2 g/dL — ABNORMAL LOW (ref 12.0–15.0)
MCH: 28.9 pg (ref 26.0–34.0)
MCHC: 29.4 g/dL — ABNORMAL LOW (ref 30.0–36.0)
MCV: 98.4 fL (ref 80.0–100.0)
Platelets: 261 10*3/uL (ref 150–400)
RBC: 3.18 MIL/uL — ABNORMAL LOW (ref 3.87–5.11)
RDW: 16.8 % — ABNORMAL HIGH (ref 11.5–15.5)
WBC: 8.4 10*3/uL (ref 4.0–10.5)
nRBC: 0 % (ref 0.0–0.2)

## 2020-12-30 LAB — BASIC METABOLIC PANEL
Anion gap: 6 (ref 5–15)
BUN: 15 mg/dL (ref 6–20)
CO2: 26 mmol/L (ref 22–32)
Calcium: 8.6 mg/dL — ABNORMAL LOW (ref 8.9–10.3)
Chloride: 107 mmol/L (ref 98–111)
Creatinine, Ser: 0.4 mg/dL — ABNORMAL LOW (ref 0.44–1.00)
GFR, Estimated: >60 mL/min (ref >60)
Glucose, Bld: 97 mg/dL (ref 70–99)
Potassium: 4.8 mmol/L (ref 3.5–5.1)
Sodium: 139 mmol/L (ref 135–145)

## 2020-12-30 LAB — PROTIME-INR
INR: 2.6 — ABNORMAL HIGH (ref 0.8–1.2)
Prothrombin Time: 27.5 seconds — ABNORMAL HIGH (ref 11.4–15.2)

## 2020-12-30 MED ORDER — WARFARIN SODIUM 5 MG PO TABS
5.0000 mg | ORAL_TABLET | Freq: Once | ORAL | Status: AC
  Administered 2020-12-30: 5 mg via ORAL
  Filled 2020-12-30: qty 1

## 2020-12-30 NOTE — Progress Notes (Addendum)
Physical Therapy Session Note  Patient Details  Name: Jennifer [name] MRN: [id number] Date of Birth: [birth year]  Today's Date: 12/30/2020 PT Individual Time:Session1: 9509-3267; Arthor Captain: [id number] PT Individual Time Calculation (min): 42 min & 30 min  Short Term Goals: Week 1:  PT Short Term Goal 1 (Week 1): Pt will perform bed mobility with minA. PT Short Term Goal 2 (Week 1): Pt will perform sit to stand with minA. PT Short Term Goal 3 (Week 1): Pt will ambulate x50' with minA and LRAD. PT Short Term Goal 4 (Week 1): Pt will initiate stair training.  Skilled Therapeutic Interventions/Progress Updates:    Session1:  Patient in w/c in room on 0.5 L O2.  Reports already up and walked and did stairs this morning.  Patient assisted in w/c to therapy gym.  Sit to stand in parallel bars througout session min to mod A with increased effort as fatigues.  Standing activities in bars including mini squats, heel raises, marches, side kicks, forward and side step ups to 6" then 4" steps and stepping forward over cones all with bilateral UE support and on 0.5L O2.  SpO2 89% and HR 115 with activity, recovers to 92% with pursed lip breathing in <1 min.  Patient transferred to mat with RW stand step min/mod A.  Patient sit to supine with S.  Hooklying marching and leg extensions for core strength, but pt more SOB in supine and needed to return to sitting performed with CGA through sidelying.  Patient able to recall "move in the tube" sternal precautions with mobility.  Sit to stand to RW from elevated mat with mod to max A.  Patient assisted in w/c to room and left seated with all needs in reach aware to call nursing for mobility.   [place]:  Patient in supine and reports feeling okay.  Plans for CXR this pm.  Patient on 1L O2 at rest and kept on portable O2 1LPM during session.  Spouse in the room and eager to assist with w/c follow for ambulation.  Patient supine to sit with S.  Sit to stand to RW from  elevated surface min A.  Patient ambulated 350' w/ RW and CGA to min A HR 113, SpO2 91%.  Ambulated 60' w/ RW through obstacle course stepping around cones and over hurdles with RW and min A.    Patient seated for armchair push up attempts x 10.  Stand step to armchair with mod A for sit to stand, performed scapular retraction with towel roll at spine w/5 sec hold x 10.  Education on postural corrections as chest heals while still following sternal precautions and possibility of need for work station assessment per [place] for postural support when returning to work.  Patient seated edge of chair for core strength with 2 # weighted ball touching hip to shoulder x 10 each side, then hip to hip.  Patient sit to stand mod to min A after repositioning feet further under knees.  Ambulated to room x 60' with RW and CGA.  Left seated EOB with spouse in the room and needs in reach.  Therapy Documentation Precautions:  Precautions Precautions: Sternal,Fall Restrictions Weight Bearing Restrictions: No Pain: Pain Assessment Pain Scale: 0-10 Pain Score: 0-No pain   Therapy/Group: Individual Therapy  Reginia Naas  Magda Kiel, PT 12/30/2020, 9:18 AM

## 2020-12-30 NOTE — Progress Notes (Signed)
PROGRESS NOTE   Subjective/Complaints: O2 sats have been lower restarted on O2, feels some SOB with laying down, does not feel legs are more swollen, no cough   ROS: Patient denies CP, N/V/D, + orthopnea Objective:   No results found. Recent Labs    12/30/20 0440  WBC 8.4  HGB 9.2*  HCT 31.3*  PLT 261   Recent Labs    12/30/20 0440  NA 139  K 4.8  CL 107  CO2 26  GLUCOSE 97  BUN 15  CREATININE 0.40*  CALCIUM 8.6*    Intake/Output Summary (Last 24 hours) at 12/30/2020 1317 Last data filed at 12/30/2020 0834 Gross per 24 hour  Intake 835 ml  Output 450 ml  Net 385 ml     Pressure Injury 12/23/20 Sacrum Mid;Medial Stage 2 -  Partial thickness loss of dermis presenting as a shallow open injury with a red, pink wound bed without slough. fissure like wound that is healing from a more round state (Active)  12/23/20 1626  Location: Sacrum  Location Orientation: Mid;Medial  Staging: Stage 2 -  Partial thickness loss of dermis presenting as a shallow open injury with a red, pink wound bed without slough.  Wound Description (Comments): fissure like wound that is healing from a more round state  Present on Admission: Yes    Physical Exam: Vital Signs Blood pressure 105/67, pulse (!) 113, temperature 99.6 F (37.6 C), temperature source Oral, resp. rate 19, height 5\' 10"  (1.778 m), weight 53.7 kg, SpO2 94 %.   General: No acute distress Mood and affect are appropriate Heart: Regular rate and rhythm no rubs murmurs or extra sounds Lungs: Clear to auscultation, breathing unlabored, no rales or wheezes Abdomen: Positive bowel sounds, soft nontender to palpation, nondistended Extremities: No clubbing, cyanosis, or edema Skin: No evidence of breakdown, no evidence of rash  Skin: sternal wound well healed with glue. Sacral wound stg II Neuro: Pt is cognitively appropriate with normal insight, memory, and awareness.  Cranial nerves 2-12 are intact. Sensory exam is normal. Reflexes are 2+ in all 4's. Fine motor coordination is intact. No tremors. Motor function is grossly 4/5 UE and 4-/5 LE. Musculoskeletal: Full ROM, No pain with AROM or PROM in the neck, trunk, or extremities. Posture appropriate     Assessment/Plan: 1. Functional deficits which require 3+ hours per day of interdisciplinary therapy in a comprehensive inpatient rehab setting.  Physiatrist is providing close team supervision and 24 hour management of active medical problems listed below.  Physiatrist and rehab team continue to assess barriers to discharge/monitor patient progress toward functional and medical goals  Care Tool:  Bathing    Body parts bathed by patient: Right arm,Left arm,Chest,Abdomen,Front perineal area,Buttocks,Right upper leg,Left upper leg,Face,Right lower leg,Left lower leg   Body parts bathed by helper: Left lower leg,Right lower leg     Bathing assist Assist Level: Supervision/Verbal cueing     Upper Body Dressing/Undressing Upper body dressing   What is the patient wearing?: Pull over shirt    Upper body assist Assist Level: Supervision/Verbal cueing    Lower Body Dressing/Undressing Lower body dressing      What is the patient wearing?:  Underwear/pull up,Pants     Lower body assist Assist for lower body dressing: Minimal Assistance - Patient > 75%     Toileting Toileting    Toileting assist Assist for toileting: Contact Guard/Touching assist     Transfers Chair/bed transfer  Transfers assist     Chair/bed transfer assist level: Moderate Assistance - Patient 50 - 74%     Locomotion Ambulation   Ambulation assist      Assist level: 2 helpers Assistive device: Hand held assist Max distance: 31'   Walk 10 feet activity   Assist  Walk 10 feet activity did not occur: Safety/medical concerns  Assist level: 2 helpers Assistive device: Hand held assist   Walk 50 feet  activity   Assist Walk 50 feet with 2 turns activity did not occur: Safety/medical concerns         Walk 150 feet activity   Assist Walk 150 feet activity did not occur: Safety/medical concerns         Walk 10 feet on uneven surface  activity   Assist Walk 10 feet on uneven surfaces activity did not occur: Safety/medical concerns         Wheelchair     Assist Will patient use wheelchair at discharge?: No             Wheelchair 50 feet with 2 turns activity    Assist            Wheelchair 150 feet activity     Assist          Blood pressure 105/67, pulse (!) 113, temperature 99.6 F (37.6 C), temperature source Oral, resp. rate 19, height 5\' 10"  (1.778 m), weight 53.7 kg, SpO2 94 %.  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 Sternal precautions. -patient may shower -ELOS/Goals: 10-14 days, supervision with PT and supervision to min assist with OT  -Continue CIR therapies including PT, OT  2. Antithrombotics: -DVT/anticoagulation:Chronic Coumadin, INR supratherapeutic -dosing adjustments per pharmacy protocol -antiplatelet therapy: Aspirin 81 mg daily 3. Pain Management:Hydrocodone as needed 4. Mood:Zoloft 25 mg daily, Wellbutrin 150 mg daily, melatonin 6 mg nightly Xanax as needed -antipsychotic agents: N/A  -mild anxiety seems to be improving 5. Neuropsych: This patientiscapable of making decisions on herown behalf. 6. Skin/Wound Care:Routine skin checks             -continue foam dressing to sacrum             -nutrition improving 7. Fluids/Electrolytes/Severe protein deficient malutrition: encourage appropriate PO    -added protein supp for low albumin   -appreciate RD help   -megace trial with improvement in po intake 8. Acute blood loss anemia.  Follow-up hgb 9.8 today. Continue iron supplement  -held iron supplement d/t nausea 9. Atrial fibrillation. Cardizem 30 mg every 6 hours, metoprolol 25 mg 3 times daily, losartan 25 mg daily, amiodarone 200 mg daily - sinus tachycardia in 110's.   -adjust chronotropics as needed based on activity tolerance -cards f/u as needed  5/29 -given ongoing hypotension---will hold losartan for now Vitals:   12/30/20 0755 12/30/20 1153  BP: 98/67 105/67  Pulse: (!) 116 (!) 113  Resp: 20 19  Temp: 97.8 F (36.6 C) 99.6 F (37.6 C)  SpO2: 96% 94%   10. Mild constipation (since coming off abx) -added senna-s at bedtime---moving bowels   -nausea resolved 11.  Mild hypoxia improved on O2 some orthopnea,  check CXR for pulm effusion LOS: 7 days A FACE TO [place] E Jalien Weakland 12/30/2020, 1:17 PM

## 2020-12-30 NOTE — Progress Notes (Signed)
Occupational Therapy Session Note  Patient Details  Name: Jennifer [name] MRN: [id number] Date of Birth: 06/09/1976  Today's Date: 12/30/2020 OT Individual Time: 1335-1425 OT Individual Time Calculation (min): 50 min    Short Term Goals: Week 1:  OT Short Term Goal 1 (Week 1): Pt will complete stand-pivot to [facility] with mod OT Short Term Goal 2 (Week 1): Pt will tolerate standing for 2 minutes within BADL task OT Short Term Goal 3 (Week 1): Pt will complete 1 step of LB dressing task  Skilled Therapeutic Interventions/Progress Updates:    Pt recevied sitting in w/c with no c/o pain. Pt on 1L O2 via [place], SpO2 92% at rest. Pt declined shower d/t fatigue and opted for sink bathing instead. She completed UB ADLs with set up assist overall at the sink. Pt on 1L O2 throughout ADLs with intermittent SpO2 drop to 85%. She was able to rebound to 92% with extended rest break seated and cueing for breathing. Edema observed in B feet- encouraged pt to elevate in bed. She stood with mod A at the sink and then required only CGA for standing balance. Following standing level ADLs pt required extended rest break for O2 to rebound from 83% to 93%. HR stable at 114 bpm. Pt returned to EOB, mod A to stand and CGA for pivot to bed. Similar O2 response ,with drop to 85%. Pt was left supine with all needs met, her husband present.   Therapy Documentation Precautions:  Precautions Precautions: Sternal,Fall Restrictions Weight Bearing Restrictions: No  Therapy/Group: Individual Therapy  Curtis Sites 12/30/2020, 6:39 AM

## 2020-12-30 NOTE — Progress Notes (Signed)
ANTICOAGULATION CONSULT NOTE - Follow-Up Consult  Pharmacy Consult for Warfarin Indication: mechanical MVR + Afib  No Known Allergies  Patient Measurements: Height: 5\' 10"  (177.8 cm) Weight: 53.7 kg (118 lb 6.2 oz) IBW/kg (Calculated) : 68.5 kg  Vital Signs: Temp: 97.8 F (36.6 C) (05/30 0755) BP: 98/67 (05/30 0755) Pulse Rate: 116 (05/30 0755)  Labs: Recent Labs    12/28/20 0606 12/29/20 0543 12/30/20 0440  HGB  --   --  9.2*  HCT  --   --  31.3*  PLT  --   --  261  LABPROT 37.4* 34.8* 27.5*  INR 3.8* 3.5* 2.6*  CREATININE  --   --  0.40*    Estimated Creatinine Clearance: 76.9 mL/min (A) (by C-G formula based on SCr of 0.4 mg/dL (L)).   Medical History: Past Medical History:  Diagnosis Date  . Marfan syndrome     Medications:  Medications Prior to Admission  Medication Sig Dispense Refill Last Dose  . Amino Acids-Protein Hydrolys (PRO-STAT 64 PO) Take 30 mLs by mouth 3 (three) times daily.     Marland Kitchen amiodarone (PACERONE) 200 MG tablet Take 200 mg by mouth daily.     Marland Kitchen aspirin EC 81 MG tablet Take 81 mg by mouth daily. Swallow whole.     Marland Kitchen buPROPion (WELLBUTRIN) 75 MG tablet Take 75 mg by mouth 2 (two) times daily.     Marland Kitchen diltiazem (CARDIZEM) 30 MG tablet Take 30 mg by mouth every 6 (six) hours.     Marland Kitchen dronabinol (MARINOL) 2.5 MG capsule Take 2.5 mg by mouth 2 (two) times daily.     . ferrous sulfate 324 MG TBEC Take 324 mg by mouth daily at 6 (six) AM.     . losartan (COZAAR) 25 MG tablet Take 25 mg by mouth daily.     Marland Kitchen MELATONIN PO Take 6 mg by mouth at bedtime.     . metoprolol tartrate (LOPRESSOR) 25 MG tablet Take 25 mg by mouth 3 (three) times daily.     . Multiple Vitamin (MULTIVITAMIN) tablet Take 1 tablet by mouth daily.     . pantoprazole (PROTONIX) 40 MG tablet Take 40 mg by mouth 2 (two) times daily.     . Probiotic Product (PROBIOTIC PO) Take 1 capsule by mouth daily.     . sertraline (ZOLOFT) 25 MG tablet Take 25 mg by mouth daily.     . simethicone  (MYLICON) 80 MG chewable tablet Chew 80 mg by mouth 4 (four) times daily.     Marland Kitchen thiamine 100 MG tablet Take 100 mg by mouth daily.     . vitamin C (ASCORBIC ACID) 500 MG tablet Take 500 mg by mouth daily.     Marland Kitchen warfarin (COUMADIN) 2.5 MG tablet Take 2.5 mg by mouth daily at 6 PM.     . atenolol (TENORMIN) 25 MG tablet TAKE 1 AND 1/2 TABLETS BY MOUTH DAILY (Patient not taking: Reported on 12/25/2020) 135 tablet 2 Not Taking at Unknown time  . 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 She has transferred to inpatient rehab from [facility] and pharmacy consulted to dose warfarin while in rehab.   5/23 Pharmacist spoke with [facility] pharmacist re: warfarin dosing: received 5 mg on 5/16, 2.5  mg 5/17-5/19, held 5/21, 2.5 mg 5/22.  INR today is therapeutic at 2.6 (goal 2.5-3.5 for prosthetic MVR + Afib). Trended down significantly from yesterday's INR of 3.5 likely due to held dose on 5/28.  No bleeding noted. CBC today stable - Hgb low but stable in 8-9s, platelets are WNL. No new interacting medications have been started and the patient's diet remains fairly stable.  Goal of Therapy:  INR 2.5-3.5 Monitor platelets by anticoagulation protocol: Yes   Plan:  -Give warfarin PO 5 mg tonight x 1 dose -Obtain a daily PT/INR -Monitor for signs and symptoms of bleeding -CBC weekly every Monday -The patient was educated on warfarin on 5/24 and will be filling discharge meds at the [place], PharmD PGY-1 Pharmacy Resident 12/30/2020 8:17 AM Please see AMION for all pharmacy numbers

## 2020-12-30 NOTE — Progress Notes (Signed)
Physical Therapy Session Note  Patient Details  Name: Jennifer [name] MRN: [id number] Date of Birth: Mar 12, 1976  Today's Date: 12/30/2020 PT Individual Time: 0803-0859 PT Individual Time Calculation (min): 56 min   Short Term Goals: Week 1:  PT Short Term Goal 1 (Week 1): Pt will perform bed mobility with minA. PT Short Term Goal 2 (Week 1): Pt will perform sit to stand with minA. PT Short Term Goal 3 (Week 1): Pt will ambulate x50' with minA and LRAD. PT Short Term Goal 4 (Week 1): Pt will initiate stair training.  Skilled Therapeutic Interventions/Progress Updates:     Pt received seated in [facility] and agrees to therapy. No complaint of pain but does report feeling discouraged about having to use supplemental oxygen again (pt on 1L). PT educates on diaphragmatic breathing to optimize oxygen sats and to manage anxiety symptoms as well. WC transport outside for time management. Pt performs sit to to stand to RW with modA. In standing pt marches in place x2:00 with cues for upright gaze to improve posture and balance, and performing scapular depression for improved body mechanics. During seated rest break, PT educates pt on optimal use of incentive spirometer and pt completes x10 breaths total, achieving 1000 mL on average each breath. Pt then attempts stair training outdoors, using R hand rail descending and L rail ascending. Pt has near complete buckle on initial step down, requiring maxA to remain standing. PT educates pt on motor planning and need to prepare muscles to accept load. On following attempt pt able to complete x4 steps with modA, with tactile cueing at L glute for improved activation and stability. WC transport back to room. Pt left seated in WC with alarm intact and all needs within reach.  Therapy Documentation Precautions:  Precautions Precautions: Sternal,Fall Restrictions Weight Bearing Restrictions: No Vital Signs: Therapy Vitals Temp: 97.8 F (36.6 C) Pulse Rate: (!)  116 Resp: 20 BP: 98/67 Patient Position (if appropriate): Sitting Oxygen Therapy SpO2: 96 % O2 Device: Nasal Cannula O2 Flow Rate (L/min): 1 L/min   Therapy/Group: Individual Therapy  Breck Coons, PT, DPT 12/30/2020, 4:03 PM

## 2020-12-31 DIAGNOSIS — R5381 Other malaise: Secondary | ICD-10-CM | POA: Diagnosis not present

## 2020-12-31 DIAGNOSIS — I4891 Unspecified atrial fibrillation: Secondary | ICD-10-CM | POA: Diagnosis not present

## 2020-12-31 DIAGNOSIS — D62 Acute posthemorrhagic anemia: Secondary | ICD-10-CM | POA: Diagnosis not present

## 2020-12-31 DIAGNOSIS — L89152 Pressure ulcer of sacral region, stage 2: Secondary | ICD-10-CM | POA: Diagnosis not present

## 2020-12-31 LAB — PROTIME-INR
INR: 2.1 — ABNORMAL HIGH (ref 0.8–1.2)
Prothrombin Time: 23.7 seconds — ABNORMAL HIGH (ref 11.4–15.2)

## 2020-12-31 MED ORDER — WARFARIN SODIUM 5 MG PO TABS
5.0000 mg | ORAL_TABLET | ORAL | Status: AC
  Administered 2020-12-31: 5 mg via ORAL
  Filled 2020-12-31: qty 1

## 2020-12-31 MED ORDER — ENOXAPARIN SODIUM 60 MG/0.6ML IJ SOSY
60.0000 mg | PREFILLED_SYRINGE | Freq: Two times a day (BID) | INTRAMUSCULAR | Status: DC
  Administered 2020-12-31 – 2021-01-01 (×3): 60 mg via SUBCUTANEOUS
  Filled 2020-12-31 (×3): qty 0.6

## 2020-12-31 MED ORDER — FUROSEMIDE 20 MG PO TABS
20.0000 mg | ORAL_TABLET | Freq: Every day | ORAL | Status: DC
  Administered 2020-12-31 – 2021-01-01 (×2): 20 mg via ORAL
  Filled 2020-12-31 (×2): qty 1

## 2020-12-31 MED ORDER — LOSARTAN POTASSIUM 50 MG PO TABS
25.0000 mg | ORAL_TABLET | Freq: Every day | ORAL | Status: DC
  Administered 2020-12-31 – 2021-01-10 (×11): 25 mg via ORAL
  Filled 2020-12-31 (×11): qty 1

## 2020-12-31 NOTE — Progress Notes (Signed)
Occupational Therapy Weekly Progress Note  Patient Details  Name: Jennifer [name] MRN: [id number] Date of Birth: [birth year]  Beginning of progress report period: Dec 24, 2020 End of progress report period: Dec 31, 2020  Today's Date: 12/31/2020 OT Individual Time: 9622-2979 OT Individual Time Calculation (min): 55 min   Patient has met 3 of 3 short term goals.  Pt is making steady progress towards OT goals. Pt can perform most BADLs at min A level, but still needs up to mod A to get to standing 2/2 LE weakness. Once in standing, pt can ambulate around room with RW and CGA/min A, but getting into standing can be difficult. Continue current POC.  Patient continues to demonstrate the following deficits: muscle weakness, decreased cardiorespiratoy endurance and decreased oxygen support and decreased sitting balance, decreased standing balance, decreased postural control, decreased balance strategies and difficulty maintaining precautions and therefore will continue to benefit from skilled OT intervention to enhance overall performance with BADL.  Patient progressing toward long term goals..  Continue plan of care.  OT Short Term Goals Week 1:  OT Short Term Goal 1 (Week 1): Pt will complete stand-pivot to [facility] with mod OT Short Term Goal 1 - Progress (Week 1): Met OT Short Term Goal 2 (Week 1): Pt will tolerate standing for 2 minutes within BADL task OT Short Term Goal 2 - Progress (Week 1): Met OT Short Term Goal 3 (Week 1): Pt will complete 1 step of LB dressing task OT Short Term Goal 3 - Progress (Week 1): Met Week 2:  OT Short Term Goal 1 (Week 2): LTG=STG 2.2 ELOS  Skilled Therapeutic Interventions/Progress Updates:    Pt greeted semi-reclined in bed and agreeable to OT treatment session focused on self-care retraining. Pt completed bed mobility with supervision. Donned socks set-up using firgure 4 position. Mod A sit<>stand from EOB w/ RW and min A to ambulate into bathtoom. Pt  transferred onto [facility] over toilet with min A for balance during clothing management. Pt voided bladder and completed peri-care with hip hike. Mod A sit<>Stand from Toilet. Pt completed bathing from shower seat with min A to wash buttocks using lateral leans. UEs fatigued quickly and needed OT assist to rinse out hair with hand held shower hose. Dressing completed from wc using figure 4 position to thread pants and min A. Sit<>stand with mod A< then pt able to pull up pants. Worked on standing balance/endurance with standing grooming tasks. Pt tolerated 5 minuets of standing while brushing hair and teeth. Pt on 1L of O2 throughout session. Pt left seated in wc at end of session with alarm belt on, call bell in reach, and needs met.   Therapy Documentation Precautions:  Precautions Precautions: Sternal,Fall Restrictions Weight Bearing Restrictions: No Pain: Pain Assessment Pain Scale: 0-10 Pain Score: 2  Faces Pain Scale: Hurts a little bit Pain Type: Acute pain Pain Location: Head Pain Descriptors / Indicators: Aching;Discomfort (dizzy)   Therapy/Group: Individual Therapy  Valma Cava 12/31/2020, 8:33 AM

## 2020-12-31 NOTE — Progress Notes (Signed)
PROGRESS NOTE   Subjective/Complaints: Sats remain a little lower. Has some sob when supine but not so much when up or moving beyond her baseline. Reports mild LE swelling by end of the day.  ROS: Patient denies fever, rash, sore throat, blurred vision, nausea, vomiting, diarrhea, cough,   chest pain, joint or back pain, headache, or mood change.   Objective:   DG Chest 2 View  Result Date: 12/30/2020 CLINICAL DATA:  Orthopnea EXAM: CHEST - 2 VIEW COMPARISON:  Dec 18, 2020 FINDINGS: The cardiomediastinal silhouette is unchanged and enlarged in contour.Status post median sternotomy and valve replacement. Small bilateral pleural effusions. No pneumothorax. LEFT retrocardiac opacity, similar in comparison to prior and likely atelectasis. Mildly increased reticular opacities and interstitial prominence at the lung bases. Visualized abdomen is unremarkable. Levocurvature of the thoracic spine superiorly. IMPRESSION: Constellation of findings are favored to reflect mildly increased pulmonary edema. There are small bilateral pleural effusions. Electronically Signed   By: Valentino Saxon MD   On: 12/30/2020 17:45   Recent Labs    12/30/20 0440  WBC 8.4  HGB 9.2*  HCT 31.3*  PLT 261   Recent Labs    12/30/20 0440  NA 139  K 4.8  CL 107  CO2 26  GLUCOSE 97  BUN 15  CREATININE 0.40*  CALCIUM 8.6*    Intake/Output Summary (Last 24 hours) at 12/31/2020 1610 Last data filed at 12/31/2020 0818 Gross per 24 hour  Intake 2505 ml  Output --  Net 2505 ml     Pressure Injury 12/23/20 Sacrum Mid;Medial Stage 2 -  Partial thickness loss of dermis presenting as a shallow open injury with a red, pink wound bed without slough. fissure like wound that is healing from a more round state (Active)  12/23/20 1626  Location: Sacrum  Location Orientation: Mid;Medial  Staging: Stage 2 -  Partial thickness loss of dermis presenting as a shallow  open injury with a red, pink wound bed without slough.  Wound Description (Comments): fissure like wound that is healing from a more round state  Present on Admission: Yes    Physical Exam: Vital Signs Blood pressure 106/78, pulse (!) 116, temperature 98.1 F (36.7 C), temperature source Oral, resp. rate 16, height 5\' 10"  (1.778 m), weight 58.6 kg, SpO2 98 %.   Constitutional: No distress . Vital signs reviewed. HEENT: EOMI, oral membranes moist Neck: supple Cardiovascular: tachy with mitral click. No JVD    Respiratory/Chest: CTA Bilaterally without wheezes or rales. Normal effort    GI/Abdomen: BS +, non-tender, non-distended Ext: no clubbing, cyanosis, or edema onexam today Psych: pleasant and cooperative Skin: sternal wound well healed with glue. Sacral wound stg II Neuro: Pt is cognitively appropriate with normal insight, memory, and awareness. Cranial nerves 2-12 are intact. Sensory exam is normal. Reflexes are 2+ in all 4's. Fine motor coordination is intact. No tremors. Motor function is grossly 4/5 UE and 4-/5 LE. Musculoskeletal: Full ROM, No pain with AROM or PROM in the neck, trunk, or extremities. Posture appropriate     Assessment/Plan: 1. Functional deficits which require 3+ hours per day of interdisciplinary therapy in a comprehensive inpatient rehab setting.  Physiatrist  is providing close team supervision and 24 hour management of active medical problems listed below.  Physiatrist and rehab team continue to assess barriers to discharge/monitor patient progress toward functional and medical goals  Care Tool:  Bathing    Body parts bathed by patient: Right arm,Left arm,Chest,Abdomen,Front perineal area,Buttocks,Right upper leg,Left upper leg,Face,Right lower leg,Left lower leg   Body parts bathed by helper: Left lower leg,Right lower leg     Bathing assist Assist Level: Supervision/Verbal cueing     Upper Body Dressing/Undressing Upper body dressing   What  is the patient wearing?: Pull over shirt    Upper body assist Assist Level: Supervision/Verbal cueing    Lower Body Dressing/Undressing Lower body dressing      What is the patient wearing?: Underwear/pull up,Pants     Lower body assist Assist for lower body dressing: Minimal Assistance - Patient > 75%     Toileting Toileting    Toileting assist Assist for toileting: Contact Guard/Touching assist     Transfers Chair/bed transfer  Transfers assist     Chair/bed transfer assist level: Minimal Assistance - Patient > 75%     Locomotion Ambulation   Ambulation assist      Assist level: Minimal Assistance - Patient > 75% Assistive device: Walker-rolling Max distance: 350'   Walk 10 feet activity   Assist  Walk 10 feet activity did not occur: Safety/medical concerns  Assist level: Contact Guard/Touching assist Assistive device: Walker-rolling   Walk 50 feet activity   Assist Walk 50 feet with 2 turns activity did not occur: Safety/medical concerns  Assist level: Contact Guard/Touching assist Assistive device: Walker-rolling    Walk 150 feet activity   Assist Walk 150 feet activity did not occur: Safety/medical concerns  Assist level: Minimal Assistance - Patient > 75% Assistive device: Walker-rolling    Walk 10 feet on uneven surface  activity   Assist Walk 10 feet on uneven surfaces activity did not occur: Safety/medical concerns         Wheelchair     Assist Will patient use wheelchair at discharge?: No             Wheelchair 50 feet with 2 turns activity    Assist            Wheelchair 150 feet activity     Assist          Blood pressure 106/78, pulse (!) 116, temperature 98.1 F (36.7 C), temperature source Oral, resp. rate 16, height 5\' 10"  (1.778 m), weight 58.6 kg, SpO2 98 %.  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 Status post MVR as well as repair of  congenital pectus excavatum. Sternal precautions. -patient may shower -ELOS/Goals: 10-14 days, supervision with PT and supervision to min assist with OT  -Continue CIR therapies including PT, OT  2. Antithrombotics: -DVT/anticoagulation:Chronic Coumadin, INR supratherapeutic -dosing adjustments per pharmacy protocol -antiplatelet therapy: Aspirin 81 mg daily 3. Pain Management:Hydrocodone as needed 4. Mood:Zoloft 25 mg daily, Wellbutrin 150 mg daily, melatonin 6 mg nightly Xanax as needed -antipsychotic agents: N/A  -mild anxiety seems to be improving 5. Neuropsych: This patientiscapable of making decisions on herown behalf. 6. Skin/Wound Care:Routine skin checks             -continue foam dressing to sacrum             -nutrition improving 7. Fluids/Electrolytes/Severe protein deficient malutrition: encourage appropriate PO    -added protein supp for low albumin   -appreciate RD  help   -megace trial with nice improvement in po intake 8. Acute blood loss anemia. Follow-up hgb 9.8 today. Continue iron supplement  -held iron supplement d/t nausea 9. Atrial fibrillation. Cardizem 30 mg every 6 hours, metoprolol 25 mg 3 times daily, losartan 25 mg daily, amiodarone 200 mg daily - sinus tachycardia still in 110's.   -adjust chronotropics as needed based on activity tolerance -cards f/u as needed  5/30--resume losartan Vitals:   12/31/20 0331 12/31/20 0837  BP: 114/86 106/78  Pulse: (!) 105 (!) 116  Resp: 16 16  Temp: 98.6 F (37 C) 98.1 F (36.7 C)  SpO2: 94% 98%   10. Mild constipation (since coming off abx) -added senna-s at bedtime---moving bowels   -nausea resolved 11.  Mild hypoxia improved on O2 some orthopnea  -cxr demonstrates mild pulmonary edema, weights are up  -will begin lasix 20mg  daily, resuming losartan as above   Filed Weights   12/23/20  1629 12/30/20 1625  Weight: 53.7 kg 58.6 kg     LOS: 8 days A FACE TO FACE EVALUATION WAS PERFORMED  Meredith Staggers 12/31/2020, 9:17 AM

## 2020-12-31 NOTE — Progress Notes (Signed)
ANTICOAGULATION CONSULT NOTE - Initial Consult  Pharmacy Consult for Coumadin + Lovenox Indication: St Jude MVR  No Known Allergies  Patient Measurements: Height: 5\' 10"  (177.8 cm) Weight: 58.6 kg (129 lb 3 oz) IBW/kg (Calculated) : 68.5  Vital Signs: Temp: 98.1 F (36.7 C) (05/31 0837) Temp Source: Oral (05/31 0837) BP: 106/78 (05/31 0837) Pulse Rate: 116 (05/31 0837)  Labs: Recent Labs    12/29/20 0543 12/30/20 0440 12/31/20 0451  HGB  --  9.2*  --   HCT  --  31.3*  --   PLT  --  261  --   LABPROT 34.8* 27.5* 23.7*  INR 3.5* 2.6* 2.1*  CREATININE  --  0.40*  --     Estimated Creatinine Clearance: 83.9 mL/min (A) (by C-G formula based on SCr of 0.4 mg/dL (L)).   Medical History: Past Medical History:  Diagnosis Date  . 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 Goal INR 2.5-3.5  CBC q Mon: Hgb 9.2 relatively stable. Plts 261 ok, INR 2.6>2.1  Goal of Therapy:  INR 2.5-3.5 Monitor platelets by anticoagulation protocol: Yes   Plan:  - Coumadin 5mg  po x 1 today. Dr. Angelique Holm ok to bridge with Lovenox until INR back in goal Lovenox 60mg  BID.  Daily INR   Abeer Deskins S. Alford Highland, PharmD, BCPS Clinical Staff Pharmacist [email or web address] Alford Highland, Kacie Huxtable Stillinger 12/31/2020,9:33 AM

## 2020-12-31 NOTE — Patient Care Conference (Signed)
Inpatient RehabilitationTeam Conference and Plan of Care Update Date: 12/31/2020   Time: 10:51 AM    Patient Name: Jennifer [name]      Medical Record Number: [id number]  Date of Birth: Dec 28, 1975 Sex: Female         Room/Bed: 4W09C/4W09C-01 Payor Info: Payor: [employer] / Plan: [employer] / Product Type: *No Product type* /    Admit Date/Time:  12/23/2020  4:13 PM  Primary Diagnosis:  [place] [facility] Problems: Principal Problem:   Debility Active Problems:   Pressure injury of skin   Protein-calorie malnutrition, severe    Expected Discharge Date: Expected Discharge Date: 01/10/21  Team Members Present: Physician leading conference: Dr. Alger Simons Care Coodinator Present: Loralee Pacas, LCSWA;Geet Hosking Creig Hines, RN, BSN, CRRN Nurse Present: Dorthula Nettles, RN PT Present: Tereasa Coop, PT OT Present: Cherylynn Ridges, OT PPS Coordinator present : Gunnar Fusi, SLP     Current Status/Progress Goal Weekly Team Focus  Bowel/Bladder   Continent of bowel/bladder. LBM 12/30/2020  remain continent  Assess Qshift and PRN   Swallow/Nutrition/ Hydration             ADL's   Min/CGA overall, can still need min/mod A for sit<>Stands  supervision/mod I  OOB tolerance, self-care retraining, endurance, sit<>stands   Mobility   supervision bed mobility, modA sit to stand, minA gait x140' with RW  Supervision  strengthening, sequencing of transfers, endurance   Communication             Safety/Cognition/ Behavioral Observations            Pain   no c/o pain  < 3  Assess Qshift and PRN   Skin   Stage 2 to coccyx. Mid chest incision.  no new skin breakdown  Assess Qshift and PRN     Discharge Planning:  D/c to home with 24/7 care from various family members (husband and sons-17/19/24); pt husband goes to work at [employer].   Team Discussion: Mild fluid overload, started Lasix, has anxiety. Continent B/B. Small stage 2 to sacrum covered with foam.  Patient on target to  meet rehab goals: yes, working on endurance. Min/mod assist for ADL's, stood for 5 minutes today and tolerated okay. When nursing takes vital signs it sets off a yellow MEWS, and it causes her anxiety. Heavy mod to stand. Once standing, walks 140 ft.  *See Care Plan and progress notes for long and short-term goals.   Revisions to Treatment Plan:  Lasix started, Cozaar resumed.  Teaching Needs: Family education, medication management, pain management, skin/wound care, transfer training, gait training, balance training, endurance training, safety awareness.  Current Barriers to Discharge: Decreased caregiver support, Medical stability, Home enviroment access/layout, Wound care, Lack of/limited family support, Medication compliance, Behavior and Nutritional means  Possible Resolutions to Barriers: Continue current medications, provide emotional support.     Medical Summary Current Status: tachycardia, mild fluid overload, lasix started, cozaar resumed. appetite much improved. pain control  Barriers to Discharge: Medical stability   Possible Resolutions to Barriers/Weekly Focus: daily assesssment of volume status, CV parameters, nutrition, mild diuresis.   Continued Need for Acute Rehabilitation Level of Care: The patient requires daily medical management by a physician with specialized training in physical medicine and rehabilitation for the following reasons: Direction of a multidisciplinary physical rehabilitation program to maximize functional independence : Yes Medical management of patient stability for increased activity during participation in an intensive rehabilitation regime.: Yes Analysis of laboratory values and/or radiology reports with  any subsequent need for medication adjustment and/or medical intervention. : Yes   I attest that I was present, lead the team conference, and concur with the assessment and plan of the team.   Cristi Loron 12/31/2020, 4:43 PM

## 2020-12-31 NOTE — Progress Notes (Signed)
Physical Therapy Weekly Progress Note  Patient Details  Name: Jennifer [name] MRN: [id number] Date of Birth: Apr 07, 1976  Beginning of progress report period: Dec 24, 2020 End of progress report period: Dec 31, 2020  Today's Date: 12/31/2020 PT Individual Time: 1102-1200 and 1432-1530 PT Individual Time Calculation (min): 58 min and 58 min  Patient has met 3 of 4 short term goals. Pt is progressing well toward mobility goals, improving independence with bed mobility, transfers, and ambulation. Pt strength and motor recruitment is improving and pt able to perform transfers consistently at [facility] level. Once standing, pt able to ambulate up to 140' with RW and CGA/minA. Pt has initiated stair training and performed up to 4 steps with 1 hand rail and modA. Focus to be continued global strengthening as well as optimal motor planning and body mechanics.  Patient continues to demonstrate the following deficits muscle weakness, decreased cardiorespiratoy endurance and decreased oxygen support and decreased sitting balance, decreased standing balance, decreased postural control and decreased balance strategies and therefore will continue to benefit from skilled PT intervention to increase functional independence with mobility.  Patient progressing toward long term goals..  Continue plan of care.  PT Short Term Goals Week 1:  PT Short Term Goal 1 (Week 1): Pt will perform bed mobility with minA. PT Short Term Goal 1 - Progress (Week 1): Met PT Short Term Goal 2 (Week 1): Pt will perform sit to stand with minA. PT Short Term Goal 2 - Progress (Week 1): Progressing toward goal PT Short Term Goal 3 (Week 1): Pt will ambulate x50' with minA and LRAD. PT Short Term Goal 3 - Progress (Week 1): Met PT Short Term Goal 4 (Week 1): Pt will initiate stair training. PT Short Term Goal 4 - Progress (Week 1): Met Week 2:  PT Short Term Goal 1 (Week 2): Pt will complete bed mobility with supervision and no bed  features. PT Short Term Goal 2 (Week 2): Pt will complete sit to stand consistently with minA. PT Short Term Goal 3 (Week 2): Pt will ambulates 150' with CGA and LRAD. PT Short Term Goal 4 (Week 2): Pt will complete x4 steps with BHRs and minA.  Skilled Therapeutic Interventions/Progress Updates:     1st Session: Pt received seated in [facility] and agrees to therapy. No complaint of pain. WC transport to gym for time management. Pt performs stand pivot transfer to mat table with modA HHA. Pt performs anterior scoot onto blue platform, with legs straddling platform to encourage anterior pelvic tilt and core engagement. Pt initially perform forward reaching with cue to use power, punching cup off end of platform. Pt then performs anterior reaching to retrieve clothespins then reaching up and using core and legs to hang clothespins on basketball net. PT provides multimodal cues for optimal sequencing. Goal raised to increase challenge during activity as pt improves performance. Pt then performs backward scooting back up onto mat with minA. Pt performs sit to stand transfer to [place] with modA. From elevated surface of [place] seat, pt performs repeated sit to stands with minA and focus on mechanics. Following, pt performs stand pivot to WC with modA. Left seated with alarm intact and all needs within reach.  2nd Session: Pt received seated at EOB and agrees to therapy. No complaint of pain but does report fatigue and weakness in legs. Pt performs sit to stand with modA and stand step to [place] with minA. WC transport to gym for time management. Pt performs squat  pivot transfer in multiple scoots from [facility] to mat table. Squat pivot technique utilized to strengthen hips and promote improved body mechanics and sequencing. Seated at edge of mat, pt performs anterior/posterior trunk "rocking", progressing to attempting to clear buttocks from table on anterior transitions. Pt cued to keep arms folded across chest to increase load  through legs. Pt unable to clear buttocks without assistance but demos appropriate sequencing. Pt transitions to supine with supervision. Rec therapist present for co-treat and discusses anxiety management and energy conservation with pt. High-low table raised to height of tilt table and pt performs supine scoot to tilt table with bridging technique. Tilt table brought to 35 degrees and pt performs leg presses, cued to flex hips and knees as low as possible while still being able to press back up to full leg extension. PT provides multimodal cues for correct motor planning and sequencing. Table angle increased to 40 degrees to increase challenge and pt continues to perform leg presses with CGA/minA. Rest breaks taken in reclined standing as needed and pt cued for pursed lip breathing due to tendency to hold breath and breath very shallowly. Pt returns to mat table via sheet slide and +2 assist. Vitals taken and pt noted to be at 80% O2. Oxygen titrated to 2L and O2 increases to 96% within 1-2 minutes. Pt also notes that she was having more difficulty breathing laying flat than in sitting. After extended seated rest break, pt ambulates x120' back to room with RW and minA. Left seated at EOB with alarm intact and all needs within reach.  Therapy Documentation Precautions:  Precautions Precautions: Sternal,Fall Restrictions Weight Bearing Restrictions: No   Therapy/Group: Individual Therapy  Breck Coons, PT, DPT 12/31/2020, 12:53 PM

## 2020-12-31 NOTE — Progress Notes (Signed)
Recreational Therapy Session Note  Patient Details  Name: Jennifer [name] MRN: [id number] Date of Birth: 09-Jul-1976 Today's Date: 12/31/2020  Pain: no c/o Skilled Therapeutic Interventions/Progress Updates: Session focused on activity tolerance, mini squats on tilt table at ~40 degrees. LRT provided Instructional/demonstration cues/encouragement for deep breathing technique emphasizing the timing of inhalation/exhalation with exercise. LRT encouraged practice of diaphragmatic breathing throughout the day to promote relaxation.  Also discussed energy conservation with various scenarios at discharge.  Aroma therapy provide per pt request.2 drops of lavender essential oil was applied to cotton ball and placed on bedside table for inhalation in the room.   Therapy/Group: Co-Treatment Ladarion Munyon 12/31/2020, 3:41 PM

## 2020-12-31 NOTE — Progress Notes (Signed)
Patient ID: IZZAH PASQUA, female   DOB: 1976/04/28, 45 y.o.   MRN: [id number]  SW met with pt in room to provide updates from team conference, and d/c date 6/10. Pt is currently on o2, and TBD if needed at discharge.  Pt aware SW to follow-up with her husband.   *SW saw pt husband here and met with him briefly in therapy gym to provide updates. Fam edu best for next Tuesday 1pm-3pm, but husband will confirm who will be coming on Thursday when he comes back to spend the day with his wife since he is off work. SW to follow-up.   Loralee Pacas, MSW, [place] Office: [phone] Cell: [phone] Fax: [phone]

## 2020-12-31 NOTE — Progress Notes (Signed)
Nutrition Follow-up  DOCUMENTATION CODES:   Severe malnutrition in context of chronic illness,Underweight  INTERVENTION:  Continue Ensure Enlive po BID, each supplement provides 350 kcal and 20 grams of protein  Continue Ensure Max po once daily, each supplement provides 150 kcal and 30 grams of protein.   Encourage adequate PO intake.   NUTRITION DIAGNOSIS:   Severe Malnutrition related to chronic illness as evidenced by severe fat depletion,severe muscle depletion; ongoing  GOAL:   Patient will meet greater than or equal to 90% of their needs; met   MONITOR:   PO intake,Supplement acceptance,Skin,Weight trends,Labs,I & O's  REASON FOR ASSESSMENT:   Malnutrition Screening Tool    ASSESSMENT:   45 year old right-handed female with history of congenital pectus excavatum, Marfan syndrome, mitral valve prolapse and known mild to moderate mitral regurgitation. CTA chest no pulmonary emboli however findings compatible fluid overload including cardiomegaly and small pericardial effusion small bilateral pleural effusion pulmonary interstitial edema.  CT abdomen pelvis with contrast demonstrated small volume ascites. Patient did undergo diuresis. EE showed endocarditis with mildly decreased ejection fraction of 45 to 50%. 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 Pt with decreased functional mobility was admitted for a comprehensive rehab program.  Meal completion 100%. Appetite and intake has improved. Pt currently has Ensure ordered and has been consuming them. RD to continue with current orders to aid in caloric and protein needs. Labs and medications reviewed.   Diet Order:   Diet Order            Diet regular Room service appropriate? Yes; Fluid consistency: Thin  Diet effective now                 EDUCATION NEEDS:   Not appropriate for education at this time  Skin:  Skin  Assessment: Skin Integrity Issues: Skin Integrity Issues:: Stage II Stage II: sacrum  Last BM:  5/30  Height:   Ht Readings from Last 1 Encounters:  12/23/20 _0  (1.778 m)    Weight:   Wt Readings from Last 1 Encounters:  12/30/20 58.6 kg    Ideal Body Weight:  68.18 kg  BMI:  Body mass index is 18.54 kg/m.  Estimated Nutritional Needs:   Kcal:  1800-2000  Protein:  85-100 grams  Fluid:  >/= 1.8 L/day  Corrin Parker, MS, RD, LDN RD pager number/after hours weekend pager number on Amion.

## 2021-01-01 DIAGNOSIS — I4891 Unspecified atrial fibrillation: Secondary | ICD-10-CM | POA: Diagnosis not present

## 2021-01-01 DIAGNOSIS — R5381 Other malaise: Secondary | ICD-10-CM | POA: Diagnosis not present

## 2021-01-01 DIAGNOSIS — Q8740 Marfan's syndrome, unspecified: Secondary | ICD-10-CM

## 2021-01-01 DIAGNOSIS — D62 Acute posthemorrhagic anemia: Secondary | ICD-10-CM | POA: Diagnosis not present

## 2021-01-01 DIAGNOSIS — L89152 Pressure ulcer of sacral region, stage 2: Secondary | ICD-10-CM | POA: Diagnosis not present

## 2021-01-01 LAB — PROTIME-INR
INR: 2.6 — ABNORMAL HIGH (ref 0.8–1.2)
Prothrombin Time: 27.6 seconds — ABNORMAL HIGH (ref 11.4–15.2)

## 2021-01-01 MED ORDER — ALPRAZOLAM 0.5 MG PO TABS
0.5000 mg | ORAL_TABLET | Freq: Two times a day (BID) | ORAL | Status: DC | PRN
  Administered 2021-01-01: 0.5 mg via ORAL
  Filled 2021-01-01: qty 2

## 2021-01-01 MED ORDER — WARFARIN SODIUM 3 MG PO TABS
3.0000 mg | ORAL_TABLET | Freq: Once | ORAL | Status: AC
  Administered 2021-01-01: 3 mg via ORAL
  Filled 2021-01-01: qty 1

## 2021-01-01 MED ORDER — FUROSEMIDE 20 MG PO TABS
20.0000 mg | ORAL_TABLET | Freq: Once | ORAL | Status: AC
  Administered 2021-01-01: 20 mg via ORAL
  Filled 2021-01-01: qty 1

## 2021-01-01 MED ORDER — FUROSEMIDE 40 MG PO TABS
40.0000 mg | ORAL_TABLET | Freq: Every day | ORAL | Status: DC
  Administered 2021-01-02 – 2021-01-10 (×9): 40 mg via ORAL
  Filled 2021-01-01 (×9): qty 1

## 2021-01-01 NOTE — Consult Note (Signed)
Neuropsychological Consultation   Patient:   Jennifer [name]   DOB:   Apr 26, 1976  MR Number:  [id number]  Location:  [facility] [id number] [place] [address] Dept: [place]: [phone]           Date of Service:   01/01/2021  Start Time:   3 PM End Time:   4 PM  Provider/Observer:  Ilean Skill, Psy.D.       Clinical Neuropsychologist       Billing Code/Service: [phone]  Chief Complaint:    Jennifer [name] is a 45 year old female with history of congenital pectus excavatum, Marfan syndrome and has been followed by cardiology with [facility].  Patient also has a history of mitral valve prolapse and known mild to moderate mitral regurgitation.  Patient presented to [facility] on 10/19/2020 with increasing shortness of breath and dyspnea over the last few months as well as lower extremity edema.  Upon presentation to the ED she was tachycardic and blood pressures in the 110s over 90, SPO2 90% on room air.  EKG was tachycardic but there was no pulmonary emboli noted.  Findings were compatible with fluid overload.  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  There were a number of complications during these surgeries and post surgery there were episodes of significant tachycardia and O2 abnormalities.  Patient was initially transferred to [facility] on 11/25/2020 and brought back for further surgical interventions and then eventually transferred to [facility] for CIR therapy due to decreased functional mobility and debility.  Reason for Service:  The patient was referred for neuropsychological consultation due to increases in anxiety and stress following all of her extensive medical and surgical interventions over the past month and a half.  The patient has significant lapses in her  memory and recall which also trigger a lot of stress.  Below is the HPI for the current admission.  HPI:Shataya L. Ohlinger is a 45 year old right-handed female with history ofcongenital pectus excavatum,Marfan syndrome followed by cardiology services [place], mitral valve prolapse and known mild to moderate mitral regurgitation. Per chart review she lives with her spouse in [place]. 1 level home 12 steps to entry. Husband works during the day. Good supportive family. She does have 3 adult aged children at home. 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 Hemoglobin 9.3. EKG demonstrated sinus tachycardia right bundle branch block. CTA chest no pulmonary emboli however findings compatible fluid overload including cardiomegaly and small pericardial effusion small bilateral pleural effusion pulmonary interstitial edema. CT abdomen pelvis with contrast demonstrated small volume ascites. Patient did undergo diuresis. Placed on Lovenox for DVT prophylaxis. 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 Coumadin therapy was initiated. Sternal precautions as directed. She did require a right chest tube for a short time removed 12/18/2020 as well as did undergo a thoracentesis for pleural effusion.Marland Kitchen She was weaned from oxygen therapy. Hospital course again complicated  by bouts of atrial fibrillation maintained on amiodarone advised to continue Coumadin with latest INR 2.6. Hospital course anemia 8.6 and monitored. Her diet was  slowly advanced to regular consistency. She was transferred to [facility] 11/25/2020 for ongoing therapies.Therapy evaluation completed due to patient decreased functional mobility was admitted for a comprehensive rehab program.  Current Status:  The patient was sitting on the edge of her bed at her bedside table awake and alert when I entered the room.  She was oriented x4 with good cognition and mental status although there was some slowing in information processing speed and response times.  The patient acknowledges that she does not feel like she has returned to baseline cognitively and continues to have difficulty pulling up words and word finding issues and difficulty thinking as quickly or as effectively as she did prior to March.  The patient has had a number of times with significant blood pressure changes along with her surgical interventions as well as significant fluctuations in oxygen levels and other blood gases.  The patient also describes a great deal of anxiety triggered by hospital activities and medical activities that remind her of how sick and impaired she has been.  Patient reports that she has clearly been able to see making functional gains during CIR and feels like she is getting stronger.  Anxiety around all of what is happened to her continues to be problematic.   Behavioral Observation: Jennifer [name]  presents as a 45 y.o.-year-old Right Caucasian Female who appeared her stated age. her dress was Appropriate and she was Well Groomed and her manners were Appropriate to the situation.  her participation was indicative of Appropriate and Redirectable behaviors.  There were physical disabilities noted.  she displayed an appropriate level of cooperation and motivation.     Interactions:    Active Appropriate  Attention:   abnormal and attention span appeared shorter than expected for age  Memory:   abnormal; remote memory intact, recent memory  impaired  Visuo-spatial:  not examined  Speech (Volume):  low  Speech:   normal; some slowed response times and clear mild word finding issues.  Thought Process:  Coherent and Relevant  Though Content:  WNL; not suicidal and not homicidal  Orientation:   person, place, time/date and situation  Judgment:   Good  Planning:   Fair  Affect:    Anxious  Mood:    Anxious and Dysphoric  Insight:   Good  Intelligence:   high  Medical History:   Past Medical History:  Diagnosis Date  . Marfan syndrome          Patient Active Problem List   Diagnosis Date Noted  . Marfan syndrome   . Protein-calorie malnutrition, severe 12/26/2020  . Debility 12/23/2020  . Pressure injury of skin 12/23/2020     Psychiatric History:  No prior psychiatric history but the development of anxiety associated around medical/hospital situations.  Family Med/Psych History:  Family History  Problem Relation Age of Onset  . Marfan syndrome Father   . Hepatitis C Father      Impression/DX:  Jennifer [name] is a 45 year old female with history of congenital pectus excavatum, Marfan syndrome and has been followed by cardiology with [facility].  Patient also has a history of mitral valve prolapse and known mild to moderate mitral regurgitation.  Patient presented to [facility] on 10/19/2020 with increasing shortness of breath and dyspnea over the last few months as well as lower extremity  edema.  Upon presentation to the ED she was tachycardic and blood pressures in the 110s over 90, SPO2 90% on room air.  EKG was tachycardic but there was no pulmonary emboli noted.  Findings were compatible with fluid overload.  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  There were a number of complications during these surgeries and post surgery there were episodes of significant tachycardia and O2 abnormalities.   Patient was initially transferred to [facility] on 11/25/2020 and brought back for further surgical interventions and then eventually transferred to [facility] for CIR therapy due to decreased functional mobility and debility.  The patient was sitting on the edge of her bed at her bedside table awake and alert when I entered the room.  She was oriented x4 with good cognition and mental status although there was some slowing in information processing speed and response times.  The patient acknowledges that she does not feel like she has returned to baseline cognitively and continues to have difficulty pulling up words and word finding issues and difficulty thinking as quickly or as effectively as she did prior to March.  The patient has had a number of times with significant blood pressure changes along with her surgical interventions as well as significant fluctuations in oxygen levels and other blood gases.  The patient also describes a great deal of anxiety triggered by hospital activities and medical activities that remind her of how sick and impaired she has been.  Patient reports that she has clearly been able to see making functional gains during CIR and feels like she is getting stronger.  Anxiety around all of what is happened to her continues to be problematic.  Disposition/Plan:  Today we worked on issues related to her anxiety primarily in the anxiety associated with various aspects of her medical care and current hospitalization.  We worked on normalizing some of these responses and feelings and help with the patient processed through and work on aspects of her hospitalization that she does not remember and feels compelled and stressed to try to remember.  There extended periods of time during her acute illness that she has no recall and retrieval 4.         Electronically Signed   _______________________ Ilean Skill, Psy.D. Clinical Neuropsychologist

## 2021-01-01 NOTE — Progress Notes (Addendum)
Recreational Therapy Session Note  Patient Details  Name: Jennifer [name] MRN: [id number] Date of Birth: March 15, 1976 Today's Date: 01/01/2021  Pain: no c/o Skilled Therapeutic Interventions/Progress Updates: Session focused on activity tolerance, sit-->stands, dynamic standing balance, mini-squats during co-treat with PT.  Pt performed sit-stands from w/c with mod assist.  Pt stood reaching for game pieces placed on racks requiring pt to reach down to her right or left.  Pt required contact guard assist for standing balance and up to min for balance during min squats reaching for game pieces.  During seated rest breaks, discussed feelings of anxiety, potential strategies to utilize.  Pt stated understanding. Therapy/Group: Co-Treatment  Pt requesting aromatherapy.  2 drops of lavender essential oil was applied to a cotton ball and placed on bedside table for inhalation in the room. Pt appreciative.  Shantel Wesely 01/01/2021, 3:42 PM

## 2021-01-01 NOTE — Progress Notes (Signed)
ANTICOAGULATION CONSULT NOTE - Initial Consult  Pharmacy Consult for Coumadin + Lovenox Indication: St Jude MVR  No Known Allergies  Patient Measurements: Height: 5\' 10"  (177.8 cm) Weight: 58.6 kg (129 lb 3 oz) IBW/kg (Calculated) : 68.5  Vital Signs: Temp: 98 F (36.7 C) (06/01 0359) BP: 111/70 (06/01 0821) Pulse Rate: 117 (06/01 0825)  Labs: Recent Labs    12/30/20 0440 12/31/20 0451 01/01/21 0501  HGB 9.2*  --   --   HCT 31.3*  --   --   PLT 261  --   --   LABPROT 27.5* 23.7* 27.6*  INR 2.6* 2.1* 2.6*  CREATININE 0.40*  --   --     Estimated Creatinine Clearance: 83.9 mL/min (A) (by C-G formula based on SCr of 0.4 mg/dL (L)).   Medical History: Past Medical History:  Diagnosis Date  . 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 Goal INR 2.5-3.5  CBC q Mon: Hgb 9.2 relatively stable. Plts 261 ok,  - INR 2.6>2.1 (add LMWH) > 2.6 today  Goal of Therapy:  INR 2.5-3.5 Monitor platelets by anticoagulation protocol: Yes   Plan:  - Coumadin 3mg  po x 1 today. -  d/c Lovenox  - Daily INR   Edna Grover S. Alford Highland, PharmD, BCPS Clinical Staff Pharmacist [email or web address] Alford Highland, [place] 01/01/2021,8:25 AM

## 2021-01-01 NOTE — Progress Notes (Signed)
Occupational Therapy Session Note  Patient Details  Name: Jennifer [name] MRN: [id number] Date of Birth: 05/31/1976  Today's Date: 01/01/2021 OT Individual Time: 6943-7005 OT Individual Time Calculation (min): 60 min    Short Term Goals: Week 2:  OT Short Term Goal 1 (Week 2): LTG=STG 2.2 ELOS  Skilled Therapeutic Interventions/Progress Updates:    Pt received supine with no c/o pain, reporting some SOB- MD aware. Pt on 1L O2 via [place]. She came to EOB and reported relief from SOB, SpO2 96%, HR 120 bpm. Pt agreeable to take shower with extra rest breaks/time. She completed sit > stand from EOB with min A using RW. Ambulatory transfer into the bathroom with RW with CGA overall. She transferred to shower chair with CGA using grab bars. SpO2 88% following transfers, HR 120 bpm. Cueing for breathing techniques and rest break with rebound to 92 %. Pt completed bathing seated on the shower chair with set up assist, intermittent cueing for nasal breathing and rest breaks, lateral leans for posterior peri hygiene. She donned shirt with supervision. Pants donned with no assist needed to thread or pull up once standing. Pt required heavy max A to sit> stand. She used RW to complete functional mobility back to the recliner with CGA. Pt SpO2 at 87% following mobility, cueing for extended rest break on 1L O2. She was left sitting up in the recliner (on roho cushion and with lumbar support), all needs met.  Therapy Documentation Precautions:  Precautions Precautions: Sternal,Fall Restrictions Weight Bearing Restrictions: No   Therapy/Group: Individual Therapy  Curtis Sites 01/01/2021, 8:55 AM

## 2021-01-01 NOTE — Progress Notes (Signed)
Physical Therapy Session Note  Patient Details  Name: Jennifer [name] MRN: [id number] Date of Birth: Dec 24, 1975  Today's Date: 01/01/2021 PT Individual Time: 1400-1500 PT Individual Time Calculation (min): 60 min   Short Term Goals: Week 1:  PT Short Term Goal 1 (Week 1): Pt will perform bed mobility with minA. PT Short Term Goal 1 - Progress (Week 1): Met PT Short Term Goal 2 (Week 1): Pt will perform sit to stand with minA. PT Short Term Goal 2 - Progress (Week 1): Progressing toward goal PT Short Term Goal 3 (Week 1): Pt will ambulate x50' with minA and LRAD. PT Short Term Goal 3 - Progress (Week 1): Met PT Short Term Goal 4 (Week 1): Pt will initiate stair training. PT Short Term Goal 4 - Progress (Week 1): Met Week 2:  PT Short Term Goal 1 (Week 2): Pt will complete bed mobility with supervision and no bed features. PT Short Term Goal 2 (Week 2): Pt will complete sit to stand consistently with minA. PT Short Term Goal 3 (Week 2): Pt will ambulates 150' with CGA and LRAD. PT Short Term Goal 4 (Week 2): Pt will complete x4 steps with BHRs and minA. Week 3:     Skilled Therapeutic Interventions/Progress Updates:    Pain:  Pt reports no pain.  Explains she has some muscle soreness but "the good kind" Treatment to tolerance.  Rest breaks and repositioning as needed.  Pt initially supine and agreeable to treatment session w/focus on global strength, Sit to stand functional strength.  Supine to side to sit required min assist from L side, pt states "I am really tired today". Sit to stand from signficiantly elelvated bed repeated x 3 for fuctional strengthening. stand pivot transfer to wc w/min assist. Pt transported to gym for co/rx session w/RT [place] [employer]. Pt engaged in [employer] game x 15 min for dynamic balance and global strengthening challenge.  Pt Sit to stand w/rw from wc w/mod assist. Stood w/RW and reached to progressively lower surface to retrieve disks requiring  squatting and return to upright > 15 times during gameplay + periods of static standing w/RW for upright tolerance/endurance. Forward/reverse stepping w/RW w/cga Pt discusses issues w/anxiety, self confidence w/mobility, brain fog, concern re:  Difficulty coming to stand.  Discussed each issue and educated pt on typical c/o w/prolonged hospitalization, cardiac surgeries, progress achieved and longterm nature of recovery.  Also discussed importance of walking program after DC.  Gait 146f w/RW, cga. stand pivot transfer wc to bedside. Repeated Sit to stand from elevated bed 2 sets of 3 w/cga for functional strengthening.   At end of session, pt left seated on edge of bed w/alarm set and tray table in front of pt w/needs in reach.    Therapy Documentation Precautions:  Precautions Precautions: Sternal,Fall Restrictions Weight Bearing Restrictions: No    Therapy/Group: Individual Therapy  BCallie Fielding PWashington6/08/2020, 3:54 PM

## 2021-01-01 NOTE — Progress Notes (Signed)
PROGRESS NOTE   Subjective/Complaints: Still feels a little sob while supine. Notes anxiety with therapy at time and also associated with medical assessment and "MEWS"  And how staff reacts in regard to this. Having occ headache while in bed also.  ROS: Patient denies fever, rash, sore throat, blurred vision, nausea, vomiting, diarrhea, cough,  chest pain, joint or back pain.   Objective:   DG Chest 2 View  Result Date: 12/30/2020 CLINICAL DATA:  Orthopnea EXAM: CHEST - 2 VIEW COMPARISON:  Dec 18, 2020 FINDINGS: The cardiomediastinal silhouette is unchanged and enlarged in contour.Status post median sternotomy and valve replacement. Small bilateral pleural effusions. No pneumothorax. LEFT retrocardiac opacity, similar in comparison to prior and likely atelectasis. Mildly increased reticular opacities and interstitial prominence at the lung bases. Visualized abdomen is unremarkable. Levocurvature of the thoracic spine superiorly. IMPRESSION: Constellation of findings are favored to reflect mildly increased pulmonary edema. There are small bilateral pleural effusions. Electronically Signed   By: Valentino Saxon MD   On: 12/30/2020 17:45   Recent Labs    12/30/20 0440  WBC 8.4  HGB 9.2*  HCT 31.3*  PLT 261   Recent Labs    12/30/20 0440  NA 139  K 4.8  CL 107  CO2 26  GLUCOSE 97  BUN 15  CREATININE 0.40*  CALCIUM 8.6*    Intake/Output Summary (Last 24 hours) at 01/01/2021 0825 Last data filed at 12/31/2020 1843 Gross per 24 hour  Intake 840 ml  Output --  Net 840 ml     Pressure Injury 12/23/20 Sacrum Mid;Medial Stage 2 -  Partial thickness loss of dermis presenting as a shallow open injury with a red, pink wound bed without slough. fissure like wound that is healing from a more round state (Active)  12/23/20 1626  Location: Sacrum  Location Orientation: Mid;Medial  Staging: Stage 2 -  Partial thickness loss of  dermis presenting as a shallow open injury with a red, pink wound bed without slough.  Wound Description (Comments): fissure like wound that is healing from a more round state  Present on Admission: Yes    Physical Exam: Vital Signs Blood pressure 111/70, pulse (!) 117, temperature 98 F (36.7 C), resp. rate 16, height 5\' 10"  (1.778 m), weight 58.6 kg, SpO2 93 %.   Constitutional: No distress . Vital signs reviewed. Frail appearing HEENT: EOMI, oral membranes moist Neck: supple Cardiovascular: Mitral click. No JVD    Respiratory/Chest: CTA Bilaterally without obvious wheezes or rales. Normal effort    GI/Abdomen: BS +, non-tender, non-distended Ext: no clubbing, cyanosis, trace ankle edema Psych: pleasant and cooperative Skin: sternal wound well healed with glue. Sacral wound stg almost resolved Neuro: Pt is cognitively appropriate with normal insight, memory, and awareness. Cranial nerves 2-12 are intact. Sensory exam is normal. Reflexes are 2+ in all 4's. Fine motor coordination is intact. No tremors. Motor function is grossly 4/5 UE and 4-/5 LE. Musculoskeletal: Full ROM, No pain with AROM or PROM in the neck, trunk, or extremities. Posture appropriate     Assessment/Plan: 1. Functional deficits which require 3+ hours per day of interdisciplinary therapy in a comprehensive inpatient rehab setting.  Physiatrist  is providing close team supervision and 24 hour management of active medical problems listed below.  Physiatrist and rehab team continue to assess barriers to discharge/monitor patient progress toward functional and medical goals  Care Tool:  Bathing    Body parts bathed by patient: Right arm,Left arm,Chest,Abdomen,Front perineal area,Buttocks,Right upper leg,Left upper leg,Face,Right lower leg,Left lower leg   Body parts bathed by helper: Left lower leg,Right lower leg     Bathing assist Assist Level: Supervision/Verbal cueing     Upper Body  Dressing/Undressing Upper body dressing   What is the patient wearing?: Pull over shirt    Upper body assist Assist Level: Supervision/Verbal cueing    Lower Body Dressing/Undressing Lower body dressing      What is the patient wearing?: Underwear/pull up,Pants     Lower body assist Assist for lower body dressing: Minimal Assistance - Patient > 75%     Toileting Toileting    Toileting assist Assist for toileting: Contact Guard/Touching assist     Transfers Chair/bed transfer  Transfers assist     Chair/bed transfer assist level: Minimal Assistance - Patient > 75%     Locomotion Ambulation   Ambulation assist      Assist level: Minimal Assistance - Patient > 75% Assistive device: Walker-rolling Max distance: 350'   Walk 10 feet activity   Assist  Walk 10 feet activity did not occur: Safety/medical concerns  Assist level: Contact Guard/Touching assist Assistive device: Walker-rolling   Walk 50 feet activity   Assist Walk 50 feet with 2 turns activity did not occur: Safety/medical concerns  Assist level: Contact Guard/Touching assist Assistive device: Walker-rolling    Walk 150 feet activity   Assist Walk 150 feet activity did not occur: Safety/medical concerns  Assist level: Minimal Assistance - Patient > 75% Assistive device: Walker-rolling    Walk 10 feet on uneven surface  activity   Assist Walk 10 feet on uneven surfaces activity did not occur: Safety/medical concerns         Wheelchair     Assist Will patient use wheelchair at discharge?: No             Wheelchair 50 feet with 2 turns activity    Assist            Wheelchair 150 feet activity     Assist          Blood pressure 111/70, pulse (!) 117, temperature 98 F (36.7 C), resp. rate 16, height 5\' 10"  (1.778 m), weight 58.6 kg, SpO2 93 %.  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 Status post MVR  as well as repair of congenital pectus excavatum. Sternal precautions. -patient may shower -ELOS/Goals: 10-14 days, supervision with PT and supervision to min assist with OT  -Continue CIR therapies including PT, OT  2. Antithrombotics: -DVT/anticoagulation:Chronic Coumadin, INR supratherapeutic -dosing adjustments per pharmacy protocol -antiplatelet therapy: Aspirin 81 mg daily 3. Pain Management:Hydrocodone as needed 4. Mood:Zoloft 25 mg daily, Wellbutrin 150 mg daily, melatonin 6 mg nightly Xanax as needed -antipsychotic agents: N/A  -discussed some coping skills for anxiety. Encouraged her to use xanax as well if anxiety is coming to a head. 5. Neuropsych: This patientiscapable of making decisions on herown behalf. 6. Skin/Wound Care:Routine skin checks             -continue foam dressing to sacrum             -nutrition improving 7. Fluids/Electrolytes/Severe protein deficient malutrition: encourage appropriate PO    -  added protein supp for low albumin   -appreciate RD help   -megace trial with nice improvement in po intake 8. Acute blood loss anemia. Follow-up hgb 9.8 today. Continue iron supplement  -held iron supplement d/t nausea 9. Atrial fibrillation. Cardizem 30 mg every 6 hours, metoprolol 25 mg 3 times daily, losartan 25 mg daily, amiodarone 200 mg daily - sinus tachycardia still in 110's.   -adjust chronotropics as needed based on activity tolerance -cards f/u as needed  5/30--resume losartan Vitals:   01/01/21 0821 01/01/21 0825  BP: 111/70   Pulse: (!) 111 (!) 117  Resp:    Temp:    SpO2:  93%   10. Mild constipation (since coming off abx) -added senna-s at bedtime---moving bowels   -nausea resolved 11.  Mild hypoxia improved on O2 some orthopnea  -cxr demonstrates mild pulmonary edema, weights are up  -6/1 still some sx, ankles a little  swollen. Increase lasix to 40mg  daily for now   [facility] Weights   12/23/20 1629 12/30/20 1625  Weight: 53.7 kg 58.6 kg     LOS: 9 days A FACE TO FACE EVALUATION WAS PERFORMED  Meredith Staggers 01/01/2021, 8:25 AM

## 2021-01-01 NOTE — Progress Notes (Signed)
Occupational Therapy Session Note  Patient Details  Name: Jennifer [name] MRN: [id number] Date of Birth: Jan 18, 1976  Today's Date: 01/01/2021 OT Individual Time: 1030-1110 OT Individual Time Calculation (min): 40 min    Short Term Goals: Week 2:  OT Short Term Goal 1 (Week 2): LTG=STG 2.2 ELOS  Skilled Therapeutic Interventions/Progress Updates:    Pt resting in recliner upon arrival. Pt requested to use toilet. O2 sats 92% on 1L. Pt amb with RW to bathroom with CGA. Toleting with supervision. Pt returned to room and stood at sink to wash hands. Pt amb to EOB to sit. OT intervention with focus on sit<>stand from elevated EOB. Sit<>stand from recliner and BSC with mod A. Sit<>stand from elevated EOB with min A. Sit<>stand X 10 with rest breaks. Pt requested to return to bed. Sit<>stupine with supervision. O2 sats on 1L at >91% throughout session. O2 in bed with HOB elevated at 85%. O2 increased to 2L and O2 sats rebounded to >90%. Pt remained in bed with all needs within reach and bed alarm activated.   Therapy Documentation Precautions:  Precautions Precautions: Sternal,Fall Restrictions Weight Bearing Restrictions: No Pain: Pain Assessment Pain Scale: 0-10 Pain Score: 0-No pain   Therapy/Group: Individual Therapy  Leroy Libman 01/01/2021, 12:02 PM

## 2021-01-02 DIAGNOSIS — R5381 Other malaise: Secondary | ICD-10-CM | POA: Diagnosis not present

## 2021-01-02 DIAGNOSIS — I4891 Unspecified atrial fibrillation: Secondary | ICD-10-CM | POA: Diagnosis not present

## 2021-01-02 DIAGNOSIS — L89152 Pressure ulcer of sacral region, stage 2: Secondary | ICD-10-CM | POA: Diagnosis not present

## 2021-01-02 DIAGNOSIS — D62 Acute posthemorrhagic anemia: Secondary | ICD-10-CM | POA: Diagnosis not present

## 2021-01-02 LAB — PROTIME-INR
INR: 2.4 — ABNORMAL HIGH (ref 0.8–1.2)
Prothrombin Time: 26.4 seconds — ABNORMAL HIGH (ref 11.4–15.2)

## 2021-01-02 MED ORDER — WARFARIN SODIUM 5 MG PO TABS
5.0000 mg | ORAL_TABLET | ORAL | Status: AC
  Administered 2021-01-02: 5 mg via ORAL
  Filled 2021-01-02: qty 1

## 2021-01-02 NOTE — Progress Notes (Signed)
Occupational Therapy Session Note  Patient Details  Name: Jennifer [name] MRN: [id number] Date of Birth: 1976/07/05  Today's Date: 01/02/2021 OT Individual Time: 1330-1415 OT Individual Time Calculation (min): 45 min    Short Term Goals: Week 1:  OT Short Term Goal 1 (Week 1): Pt will complete stand-pivot to [facility] with mod OT Short Term Goal 1 - Progress (Week 1): Met OT Short Term Goal 2 (Week 1): Pt will tolerate standing for 2 minutes within BADL task OT Short Term Goal 2 - Progress (Week 1): Met OT Short Term Goal 3 (Week 1): Pt will complete 1 step of LB dressing task OT Short Term Goal 3 - Progress (Week 1): Met  Skilled Therapeutic Interventions/Progress Updates:    1:1. Pt received EOB agreeable to OT. Pt completes stand pivot transfers with MOD A for power up and CGA for stepping to w/c/mat/bed. Pt completes chest mobility activities/yoga stretches to improve rib expansion/chest ROM to improve breathe. Pt completes side bending, anterior reaching, cat/cow, scap retraction and leaning back onto therapy ball with towel roll along spine. Pt with no report of pain when asked after each activity. OT faciliates progressive muscle relaxation followed by guided meditation focused on resilience. Pt reporting feelings of calmness after activity. Exited session with pt seated in bed, exit alarm on and call light in reach   Therapy Documentation Precautions:  Precautions Precautions: Sternal,Fall Restrictions Weight Bearing Restrictions: (P) No General:   Vital Signs: Therapy Vitals Temp: 97.6 F (36.4 C) Temp Source: Oral Pulse Rate: (!) 105 Resp: 20 BP: 107/76 Patient Position (if appropriate): Lying Oxygen Therapy SpO2: 92 % O2 Device: Nasal Cannula O2 Flow Rate (L/min): 2 L/min Pain:   ADL: ADL Eating: Set up Grooming: Setup Upper Body Bathing: Minimal assistance Lower Body Bathing: Maximal assistance Upper Body Dressing: Moderate assistance Lower Body Dressing:  Maximal assistance Toileting: Maximal assistance Toilet Transfer: Moderate assistance Vision   Perception    Praxis   Exercises:   Other Treatments:     Therapy/Group: Individual Therapy  Tonny Branch 01/02/2021, 6:50 AM

## 2021-01-02 NOTE — Progress Notes (Signed)
Patient resting in low fowler position  Upon rounding and taking breakfast tray into room, verbalize discomfort of having double vision to both eyes, dizziness this morning with headache, Denies , chest pain,SOB, or anxiousness,, HOB elevated. VS 113/78=110- 20, O2 sat 96%- 2 liters nasally, cool cloth to forehead,  0659  Tylenol 650 mg po administered with slight nausea, monitor and assisted. Monitor and assisted, informed oncoming nurse of this episode and to f/u with prn medication

## 2021-01-02 NOTE — Progress Notes (Signed)
ANTICOAGULATION CONSULT NOTE - Initial Consult  Pharmacy Consult for Coumadin + Lovenox Indication: St Jude MVR  No Known Allergies  Patient Measurements: Height: 5\' 10"  (177.8 cm) Weight: 58.6 kg (129 lb 3 oz) IBW/kg (Calculated) : 68.5  Vital Signs: Temp: 97.6 F (36.4 C) (06/02 0813) Temp Source: Oral (06/02 0813) BP: 114/79 (06/02 0813) Pulse Rate: 115 (06/02 0813)  Labs: Recent Labs    12/31/20 0451 01/01/21 0501 01/02/21 0503  LABPROT 23.7* 27.6* 26.4*  INR 2.1* 2.6* 2.4*    Estimated Creatinine Clearance: 83.9 mL/min (A) (by C-G formula based on SCr of 0.4 mg/dL (L)).   Medical History: Past Medical History:  Diagnosis Date  . 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 Goal INR 2.5-3.5  CBC q Mon: Hgb 9.2 relatively stable. Plts 261 ok,  - INR 2.4 slightly low  Goal of Therapy:  INR 2.5-3.5 Monitor platelets by anticoagulation protocol: Yes   Plan:  - Coumadin 5mg  po x 1 stat  - Daily INR   Jennifer Marvin S. Jennifer [name], PharmD, BCPS Clinical Staff Pharmacist [email or web address] Jennifer [name], Jennifer [name] 01/02/2021,9:25 AM

## 2021-01-02 NOTE — Progress Notes (Signed)
Physical Therapy Session Note  Patient Details  Name: Jennifer [name] MRN: [id number] Date of Birth: 1975/12/02  Today's Date: 01/02/2021 PT Individual Time: 1015-1045 PT Individual Time Calculation (min): 30 min   Short Term Goals: Week 2:  PT Short Term Goal 1 (Week 2): Pt will complete bed mobility with supervision and no bed features. PT Short Term Goal 2 (Week 2): Pt will complete sit to stand consistently with minA. PT Short Term Goal 3 (Week 2): Pt will ambulates 150' with CGA and LRAD. PT Short Term Goal 4 (Week 2): Pt will complete x4 steps with BHRs and minA.  Skilled Therapeutic Interventions/Progress Updates:    Pt received sitting in w/c, agreeable to PT session. No reports of pain, dizziness, diplopia, or lightheadedness. Pt requesting light activity and on 3L nasal canula supplemental oxygen. W/c transport for time management and energy conservation to day room rehab gym. Stand<>pivot with minA (for powering to rise) and no AD to high/low mat table. Vitals assessed - BP 87/58, O2 100%, HR 114 - pt reports low BP at baseline and denies any symptoms. Completed a "warm up" of 1 min of alternating LAQ. Then focused remainder of session on functional sit<>stands. Mat table raised to allow improved technique and success with performance - required minA for powering to rise and able to complete 10 reps. O2 assessed throughout, maintaining >98% while on 2L portable tank. Stand<>pivot with minA back to chair and wheeled back to room. Stand<>pivot again with no AD back to bed with minA and pt requesting to remain seated EOB at end of session. Connected to 2.5L wall O2 and bed alarm set. Needs within reach.   Therapy Documentation Precautions:  Precautions Precautions: Sternal,Fall Restrictions Weight Bearing Restrictions: (P) No General:    Therapy/Group: Individual Therapy  Alger Simons 01/02/2021, 7:43 AM

## 2021-01-02 NOTE — Progress Notes (Signed)
Physical Therapy Session Note  Patient Details  Name: Jennifer [name] MRN: [id number] Date of Birth: June 18, 1976  Today's Date: 01/02/2021 PT Individual Time: 6962-9528 PT Individual Time Calculation (min): 45 min   Short Term Goals: Week 2:  PT Short Term Goal 1 (Week 2): Pt will complete bed mobility with supervision and no bed features. PT Short Term Goal 2 (Week 2): Pt will complete sit to stand consistently with minA. PT Short Term Goal 3 (Week 2): Pt will ambulates 150' with CGA and LRAD. PT Short Term Goal 4 (Week 2): Pt will complete x4 steps with BHRs and minA.  Skilled Therapeutic Interventions/Progress Updates:     Pt misses 30 minutes of skilled PT due to still eating breakfast upon PT arrival. On follow-up, pt agreeable to therapy but reports "double-vision". No complaint of pain. Pt performs supine to sit with supervision and use of bed features. Pt spends extended time sitting at EOB to acclimate body to upright positioning. Pt reports some improvement in symptoms. BP in sitting 99/68 and HR 112. Sit to stand and stand step transfer to [facility] with modA. WC transport to gym for time management. Pt perform stand pivot transfer to mat table with modA. Sit to supine with cues on positioning. Pt performs x5 assisted bridge in supine for strengthening. Pt noted to have increased work of breathing and O2 checked. Sats at 80% and pt noticeably short of breath. Pt assisted onto wedge for semi-fowler position and sats slow improve to >90%. Pt performs x5 additional reps of assisted bridge. Following, pt again noted to have O2 at ~85%. PT cues pt on diaphragmatic breathing, along with rec therapist who is present for session, and guide pt through relaxation technique. PT encourages pt to attempt sidelying to allow for increased chest wall excursion and back mobility. In sidelying pt's sats increase quickly to ~95%, and pt verbalized improved symptoms. Pt gradually transitions to prone position on wedge,  with emphasis on anterior chest stretch and back body excursion for increased lung expansion. Pt at ~93% while prone. Prone to seated with minA. Stand step transfer from mat to [facility] with modA. Left seated with alarm intact and all needs within reach.  Therapy Documentation Precautions:  Precautions Precautions: Sternal,Fall Restrictions Weight Bearing Restrictions: No   Therapy/Group: Individual Therapy  Breck Coons, PT, DPT 01/02/2021, 4:01 PM

## 2021-01-02 NOTE — Progress Notes (Signed)
PROGRESS NOTE   Subjective/Complaints: Breathing a little better. Anxious still at times  ROS: Patient denies fever, rash, sore throat, blurred vision, nausea, vomiting, diarrhea, cough,   chest pain, joint or back pain, headache, or mood change.    Objective:   No results found. No results for input(s): WBC, HGB, HCT, PLT in the last 72 hours. No results for input(s): NA, K, CL, CO2, GLUCOSE, BUN, CREATININE, CALCIUM in the last 72 hours.  Intake/Output Summary (Last 24 hours) at 01/02/2021 1037 Last data filed at 01/02/2021 7517 Gross per 24 hour  Intake 840 ml  Output --  Net 840 ml     Pressure Injury 12/23/20 Sacrum Mid;Medial Stage 2 -  Partial thickness loss of dermis presenting as a shallow open injury with a red, pink wound bed without slough. fissure like wound that is healing from a more round state (Active)  12/23/20 1626  Location: Sacrum  Location Orientation: Mid;Medial  Staging: Stage 2 -  Partial thickness loss of dermis presenting as a shallow open injury with a red, pink wound bed without slough.  Wound Description (Comments): fissure like wound that is healing from a more round state  Present on Admission: Yes    Physical Exam: Vital Signs Blood pressure 114/79, pulse (!) 115, temperature 97.6 F (36.4 C), temperature source Oral, resp. rate 18, height 5\' 10"  (1.778 m), weight 58.6 kg, SpO2 95 %.   Constitutional: No distress . Vital signs reviewed. HEENT: EOMI, oral membranes moist Neck: supple Cardiovascular: RRR without murmur. No JVD    Respiratory/Chest: CTA Bilaterally without wheezes or rales. Normal effort    GI/Abdomen: BS +, non-tender, non-distended Ext: no clubbing, cyanosis, or edema Psych: pleasant and cooperative Skin: sternal wound well healed with glue. Sacral wound stg almost resolved Neuro: Pt is cognitively appropriate with normal insight, memory, and awareness. Cranial nerves  2-12 are intact. Sensory exam is normal. Reflexes are 2+ in all 4's. Fine motor coordination is intact. No tremors. Motor function is grossly 4/5 UE and 4-/5 LE. Musculoskeletal: Full ROM, No pain with AROM or PROM in the neck, trunk, or extremities. Posture appropriate     Assessment/Plan: 1. Functional deficits which require 3+ hours per day of interdisciplinary therapy in a comprehensive inpatient rehab setting.  Physiatrist is providing close team supervision and 24 hour management of active medical problems listed below.  Physiatrist and rehab team continue to assess barriers to discharge/monitor patient progress toward functional and medical goals  Care Tool:  Bathing    Body parts bathed by patient: Right arm,Left arm,Chest,Abdomen,Front perineal area,Buttocks,Right upper leg,Left upper leg,Face,Right lower leg,Left lower leg   Body parts bathed by helper: Left lower leg,Right lower leg     Bathing assist Assist Level: Supervision/Verbal cueing     Upper Body Dressing/Undressing Upper body dressing   What is the patient wearing?: Pull over shirt    Upper body assist Assist Level: Supervision/Verbal cueing    Lower Body Dressing/Undressing Lower body dressing      What is the patient wearing?: Underwear/pull up,Pants     Lower body assist Assist for lower body dressing: Minimal Assistance - Patient > 75%  Toileting Toileting    Toileting assist Assist for toileting: Supervision/Verbal cueing     Transfers Chair/bed transfer  Transfers assist     Chair/bed transfer assist level: Minimal Assistance - Patient > 75%     Locomotion Ambulation   Ambulation assist      Assist level: Minimal Assistance - Patient > 75% Assistive device: Walker-rolling Max distance: 350'   Walk 10 feet activity   Assist  Walk 10 feet activity did not occur: Safety/medical concerns  Assist level: Contact Guard/Touching assist Assistive device: Walker-rolling    Walk 50 feet activity   Assist Walk 50 feet with 2 turns activity did not occur: Safety/medical concerns  Assist level: Contact Guard/Touching assist Assistive device: Walker-rolling    Walk 150 feet activity   Assist Walk 150 feet activity did not occur: Safety/medical concerns  Assist level: Minimal Assistance - Patient > 75% Assistive device: Walker-rolling    Walk 10 feet on uneven surface  activity   Assist Walk 10 feet on uneven surfaces activity did not occur: Safety/medical concerns         Wheelchair     Assist Will patient use wheelchair at discharge?: No             Wheelchair 50 feet with 2 turns activity    Assist            Wheelchair 150 feet activity     Assist          Blood pressure 114/79, pulse (!) 115, temperature 97.6 F (36.4 C), temperature source Oral, resp. rate 18, height 5\' 10"  (1.778 m), weight 58.6 kg, SpO2 95 %.  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 Sternal precautions. -patient may shower -ELOS/Goals: 10-14 days, supervision with PT and supervision to min assist with OT  -Continue CIR therapies including PT, OT   2. Antithrombotics: -DVT/anticoagulation:Chronic Coumadin, INR supratherapeutic -dosing adjustments per pharmacy protocol -antiplatelet therapy: Aspirin 81 mg daily 3. Pain Management:Hydrocodone as needed 4. Mood:Zoloft 25 mg daily, Wellbutrin 150 mg daily, melatonin 6 mg nightly Xanax as needed -antipsychotic agents: N/A  -discussed some coping skills for anxiety. Encouraged her to use xanax as well if anxiety is coming to a head. 5. Neuropsych: This patientiscapable of making decisions on herown behalf. 6. Skin/Wound Care:Routine skin checks             -continue foam dressing to sacrum              -nutrition better 7. Fluids/Electrolytes/Severe protein deficient malutrition: encourage appropriate PO    -added protein supp for low albumin   -appreciate RD help   -megace to continue 8. Acute blood loss anemia. Follow-up hgb 9.8 today. Continue iron supplement  -held iron supplement d/t nausea 9. Atrial fibrillation. Cardizem 30 mg every 6 hours, metoprolol 25 mg 3 times daily, losartan 25 mg daily, amiodarone 200 mg daily - sinus tachycardia still in 110's.   -adjust chronotropics as needed based on activity tolerance -cards f/u as needed  5/30--resume losartan Vitals:   01/02/21 0409 01/02/21 0813  BP: 107/76 114/79  Pulse: (!) 105 (!) 115  Resp: 20 18  Temp: 97.6 F (36.4 C) 97.6 F (36.4 C)  SpO2: 92% 95%   10. Mild constipation   -  senna-s at bedtime---moving bowels   -nausea resolved 11.  Mild hypoxia improved on O2 some orthopnea  -cxr demonstrates mild pulmonary edema, weights are up  -  6/2 continue lasix  40mg  daily for now   -check bmet tomorrow Filed Weights   12/23/20 1629 12/30/20 1625  Weight: 53.7 kg 58.6 kg     LOS: 10 days A FACE TO FACE EVALUATION WAS PERFORMED  Meredith Staggers 01/02/2021, 10:37 AM

## 2021-01-02 NOTE — Progress Notes (Signed)
Occupational Therapy Session Note  Patient Details  Name: Jennifer [name] MRN: [id number] Date of Birth: [birth year]  Today's Date: 01/02/2021 OT Individual Time: 1416-1530 OT Individual Time Calculation (min): 74 min   Short Term Goals: Week 2:  OT Short Term Goal 1 (Week 2): LTG=STG 2.2 ELOS  Skilled Therapeutic Interventions/Progress Updates:    Pt greeted semi-reclined in bed and agreeable to OT Treatment session. Pt reported feeling a little "off" today. Pt stated she is back on 2L of O2 and has had some shortness of breath intermittently today. Pt agreeable to go outside for some fresh air. Sit<>stand with mod A, then min A to pivot to wc. Pt maintained on 2L throughout session. Pt brought outside in the shade and worked on sit<>stands with mod/max A to get to standing 2/2 LE fatigue. Once standing, pt tolerated 3 minute standing intervals while performing chest stretches. Pt also performed mini squats in standing. Lateral hip sway while listening to pt preferred beach music. Pt returned to upstairs and continued working on standing balance/endurance with Wii bowling activity. Pt tolerated  2 standing bouts at 10 minute intervals while completing 10 bowling frames. Min A for balance/endurance. Pt returned to room and agreeable to stay up in wc for a bit. Pt left seated in wc with alarm belt on, call bell in reach, and needs met.   Therapy Documentation Precautions:  Precautions Precautions: Sternal,Fall Restrictions Weight Bearing Restrictions: No Pain:  denies pain  Therapy/Group: Individual Therapy  Valma Cava 01/02/2021, 3:18 PM

## 2021-01-03 ENCOUNTER — Inpatient Hospital Stay (HOSPITAL_COMMUNITY): Payer: 59

## 2021-01-03 DIAGNOSIS — D62 Acute posthemorrhagic anemia: Secondary | ICD-10-CM | POA: Diagnosis not present

## 2021-01-03 DIAGNOSIS — R5381 Other malaise: Secondary | ICD-10-CM | POA: Diagnosis not present

## 2021-01-03 DIAGNOSIS — I4891 Unspecified atrial fibrillation: Secondary | ICD-10-CM | POA: Diagnosis not present

## 2021-01-03 DIAGNOSIS — L89152 Pressure ulcer of sacral region, stage 2: Secondary | ICD-10-CM | POA: Diagnosis not present

## 2021-01-03 LAB — BASIC METABOLIC PANEL
Anion gap: 3 — ABNORMAL LOW (ref 5–15)
BUN: 14 mg/dL (ref 6–20)
CO2: 38 mmol/L — ABNORMAL HIGH (ref 22–32)
Calcium: 8.9 mg/dL (ref 8.9–10.3)
Chloride: 100 mmol/L (ref 98–111)
Creatinine, Ser: 0.38 mg/dL — ABNORMAL LOW (ref 0.44–1.00)
GFR, Estimated: >60 mL/min (ref >60)
Glucose, Bld: 98 mg/dL (ref 70–99)
Potassium: 5 mmol/L (ref 3.5–5.1)
Sodium: 141 mmol/L (ref 135–145)

## 2021-01-03 LAB — CBC
HCT: 32.1 % — ABNORMAL LOW (ref 36.0–46.0)
Hemoglobin: 9.1 g/dL — ABNORMAL LOW (ref 12.0–15.0)
MCH: 28.9 pg (ref 26.0–34.0)
MCHC: 28.3 g/dL — ABNORMAL LOW (ref 30.0–36.0)
MCV: 101.9 fL — ABNORMAL HIGH (ref 80.0–100.0)
Platelets: 267 10*3/uL (ref 150–400)
RBC: 3.15 MIL/uL — ABNORMAL LOW (ref 3.87–5.11)
RDW: 16.8 % — ABNORMAL HIGH (ref 11.5–15.5)
WBC: 8.4 10*3/uL (ref 4.0–10.5)
nRBC: 0 % (ref 0.0–0.2)

## 2021-01-03 LAB — PROTIME-INR
INR: 2.6 — ABNORMAL HIGH (ref 0.8–1.2)
Prothrombin Time: 27.6 seconds — ABNORMAL HIGH (ref 11.4–15.2)

## 2021-01-03 MED ORDER — WARFARIN SODIUM 4 MG PO TABS
4.0000 mg | ORAL_TABLET | Freq: Every day | ORAL | Status: DC
  Administered 2021-01-03: 4 mg via ORAL
  Filled 2021-01-03 (×2): qty 1

## 2021-01-03 NOTE — Progress Notes (Signed)
ANTICOAGULATION CONSULT NOTE - Initial Consult  Pharmacy Consult for Coumadin  Indication: St Jude MVR  No Known Allergies  Patient Measurements: Height: 5\' 10"  (177.8 cm) Weight: 58.6 kg (129 lb 3 oz) IBW/kg (Calculated) : 68.5  Vital Signs: Temp: 98.3 F (36.8 C) (06/03 0833) Temp Source: Oral (06/03 0833) BP: 114/73 (06/03 0833) Pulse Rate: 104 (06/03 0833)  Labs: Recent Labs    01/01/21 0501 01/02/21 0503 01/03/21 0622  LABPROT 27.6* 26.4* 27.6*  INR 2.6* 2.4* 2.6*  CREATININE  --   --  0.38*    Estimated Creatinine Clearance: 83.9 mL/min (A) (by C-G formula based on SCr of 0.38 mg/dL (L)).   Medical History: Past Medical History:  Diagnosis Date  . 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 Goal INR 2.5-3.5  CBC q Mon: Hgb 9.2 relatively stable. Plts 261 ok - INR up to 2.6 today. We will try a standing dose today.   Goal of Therapy:  INR 2.5-3.5 Monitor platelets by anticoagulation protocol: Yes   Plan:  - Coumadin 4mg  PO qday  - Daily INR  Onnie Boer, PharmD, BCIDP, AAHIVP, CPP Infectious Disease Pharmacist 01/03/2021 10:24 AM

## 2021-01-03 NOTE — Progress Notes (Signed)
Occupational Therapy Session Note  Patient Details  Name: Jennifer [name] MRN: [id number] Date of Birth: 07/05/1976  Today's Date: 01/03/2021 OT Individual Time: 4248-1443 OT Individual Time Calculation (min): 72 min    Short Term Goals: Week 2:  OT Short Term Goal 1 (Week 2): LTG=STG 2.2 ELOS  Skilled Therapeutic Interventions/Progress Updates:    Pt greeted seated in wc finishing lunch and agreeable to OT treatment session focused on self-care retraining. Pt needed mod A to get to standing from wc w/ RW, then ambulated to suitcase placed on recliner with RW and CGA. Pt able to reach forward and collect clothing w/ CGA. Pt then ambulated into bathroom in similar fashion. Pt doffed clothing from shower bench. Bathing completed with overall CGA and lateral leans to wash buttocks. Pt was able to bend forward and assist with washing feet. Pt needed mod A to get to standing from lower shower chair and heavy use of grab bars. Pt then pivoted to wc with CGA.. Worked on Standing balance/endurance with standing grooming tasks for 3 minutes. LB dressing with mod A to stand, then pt able to pull up pants. UB endurance with seated hair drying task. Pt on 2L of O2 throughout session. Assessed SpO2 as noted below.  Sitting at rest: `100% At rest on RA for 2 mins: desat to 88% At rest on 1L for 2 minutes: 91% Standing for 1 minute on 1L: 89% At rest on 2L for 2 minutes: 100%  Pt left on 2L of O2 and left seated in wc with alarm belt on, call bell in reach, and needs met.   Therapy Documentation Precautions:  Precautions Precautions: Sternal,Fall Restrictions Weight Bearing Restrictions: No Pain:  denies pain  Therapy/Group: Individual Therapy  Valma Cava 01/03/2021, 1:54 PM

## 2021-01-03 NOTE — Progress Notes (Signed)
Physical Therapy Session Note  Patient Details  Name: Jennifer [name] MRN: [id number] Date of Birth: 1976-05-10  Today's Date: 01/03/2021 PT Individual Time: 0905-1000 PT Individual Time Calculation (min): 55 min   Short Term Goals: Week 2:  PT Short Term Goal 1 (Week 2): Pt will complete bed mobility with supervision and no bed features. PT Short Term Goal 2 (Week 2): Pt will complete sit to stand consistently with minA. PT Short Term Goal 3 (Week 2): Pt will ambulates 150' with CGA and LRAD. PT Short Term Goal 4 (Week 2): Pt will complete x4 steps with BHRs and minA.  Skilled Therapeutic Interventions/Progress Updates:    Patient received reclined in bed, agreeable to PT. She denies pain, but reports SOB. On 2L via [place], patient satting at 95%, HR 112. Patient coming to sit edge of bed with supervision and transferring to wc via stand pivot with MinA. PT transporting patient in wc to therapy gym for time management and energy conservation. NuStep intervals completed for increased cardiovascular endurance 2x1.5 mins, 3x2 mins with O2 consistently >93% and HR fluctuating between 115-121BPM. Patient noted to have limited power production from B LE, L >R, when on the NuStep, likely contributing to her limited to ability to complete sit <> stands independently. Patient ambulating ~20ft with RW and CGA to therapy mat. Blocked practice sit <> stands completed with RW starting at elevated mat height progressing to standard chair height. Assist starting at supervision progressing to [place] when standing up from standard chair height. Patient ambulating 78ft with RW and CGA, slow gait speed and general lateral hip instability noted. Patient returning to room in wc, seatbelt alarm on, call light within reach.   Therapy Documentation Precautions:  Precautions Precautions: Sternal,Fall Restrictions Weight Bearing Restrictions: No    Therapy/Group: Individual Therapy  Karoline Caldwell,  PT, DPT, CBIS  01/03/2021, 7:47 AM

## 2021-01-03 NOTE — Progress Notes (Signed)
PROGRESS NOTE   Subjective/Complaints: Still feels that she's slightly sob. sats dropped to 80% while in PT yesterday.  Denies any cough or fever. Doesn't feel that she's overly anxious  ROS: Patient denies fever, rash, sore throat, blurred vision, nausea, vomiting, diarrhea, cough,   chest pain, joint or back pain, headache, or mood change.   Objective:   No results found. No results for input(s): WBC, HGB, HCT, PLT in the last 72 hours. Recent Labs    01/03/21 0622  NA 141  K 5.0  CL 100  CO2 38*  GLUCOSE 98  BUN 14  CREATININE 0.38*  CALCIUM 8.9    Intake/Output Summary (Last 24 hours) at 01/03/2021 0906 Last data filed at 01/03/2021 0086 Gross per 24 hour  Intake 1800 ml  Output 0 ml  Net 1800 ml     Pressure Injury 12/23/20 Sacrum Mid;Medial Stage 2 -  Partial thickness loss of dermis presenting as a shallow open injury with a red, pink wound bed without slough. fissure like wound that is healing from a more round state (Active)  12/23/20 1626  Location: Sacrum  Location Orientation: Mid;Medial  Staging: Stage 2 -  Partial thickness loss of dermis presenting as a shallow open injury with a red, pink wound bed without slough.  Wound Description (Comments): fissure like wound that is healing from a more round state  Present on Admission: Yes    Physical Exam: Vital Signs Blood pressure 114/73, pulse (!) 104, temperature 98.3 F (36.8 C), temperature source Oral, resp. rate 18, height 5\' 10"  (1.778 m), weight 58.6 kg, SpO2 91 %.   Constitutional: No distress . Vital signs reviewed. HEENT: EOMI, oral membranes moist Neck: supple Cardiovascular: tachy with mitral click. No JVD    Respiratory/Chest: CTA Bilaterally without wheezes or rales. Normal effort    GI/Abdomen: BS +, non-tender, non-distended Ext: no clubbing, cyanosis, tr LE edema Psych: pleasant, a little flat Skin: sternal wound well healed with  glue. Sacral wound esstly resolved Neuro: Pt is cognitively appropriate with normal insight, memory, and awareness. Cranial nerves 2-12 are intact. Sensory exam is normal. Reflexes are 2+ in all 4's. Fine motor coordination is intact. No tremors. Motor function is grossly 4/5 UE and 4-/5 LE. Musculoskeletal: Full ROM, No pain with AROM or PROM in the neck, trunk, or extremities. Posture appropriate     Assessment/Plan: 1. Functional deficits which require 3+ hours per day of interdisciplinary therapy in a comprehensive inpatient rehab setting.  Physiatrist is providing close team supervision and 24 hour management of active medical problems listed below.  Physiatrist and rehab team continue to assess barriers to discharge/monitor patient progress toward functional and medical goals  Care Tool:  Bathing    Body parts bathed by patient: Right arm,Left arm,Chest,Abdomen,Front perineal area,Buttocks,Right upper leg,Left upper leg,Face,Right lower leg,Left lower leg   Body parts bathed by helper: Left lower leg,Right lower leg     Bathing assist Assist Level: Supervision/Verbal cueing     Upper Body Dressing/Undressing Upper body dressing   What is the patient wearing?: Pull over shirt    Upper body assist Assist Level: Supervision/Verbal cueing    Lower Body Dressing/Undressing Lower  body dressing      What is the patient wearing?: Underwear/pull up,Pants     Lower body assist Assist for lower body dressing: Minimal Assistance - Patient > 75%     Toileting Toileting    Toileting assist Assist for toileting: Supervision/Verbal cueing     Transfers Chair/bed transfer  Transfers assist     Chair/bed transfer assist level: Minimal Assistance - Patient > 75%     Locomotion Ambulation   Ambulation assist      Assist level: Minimal Assistance - Patient > 75% Assistive device: Walker-rolling Max distance: 350'   Walk 10 feet activity   Assist  Walk 10 feet  activity did not occur: Safety/medical concerns  Assist level: Contact Guard/Touching assist Assistive device: Walker-rolling   Walk 50 feet activity   Assist Walk 50 feet with 2 turns activity did not occur: Safety/medical concerns  Assist level: Contact Guard/Touching assist Assistive device: Walker-rolling    Walk 150 feet activity   Assist Walk 150 feet activity did not occur: Safety/medical concerns  Assist level: Minimal Assistance - Patient > 75% Assistive device: Walker-rolling    Walk 10 feet on uneven surface  activity   Assist Walk 10 feet on uneven surfaces activity did not occur: Safety/medical concerns         Wheelchair     Assist Will patient use wheelchair at discharge?: No             Wheelchair 50 feet with 2 turns activity    Assist            Wheelchair 150 feet activity     Assist          Blood pressure 114/73, pulse (!) 104, temperature 98.3 F (36.8 C), temperature source Oral, resp. rate 18, height 5\' 10"  (1.778 m), weight 58.6 kg, SpO2 91 %.  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 Sternal precautions. -patient may shower -ELOS/Goals: 10-14 days, supervision with PT and supervision to min assist with OT  -Continue CIR therapies including PT, OT    2. Antithrombotics: -DVT/anticoagulation:Chronic Coumadin, INR therapeutic -dosing adjustments per pharmacy protocol -antiplatelet therapy: Aspirin 81 mg daily 3. Pain Management:Hydrocodone as needed 4. Mood:Zoloft 25 mg daily, Wellbutrin 150 mg daily, melatonin 6 mg nightly Xanax as needed -antipsychotic agents: N/A  -discussed some coping skills for anxiety. Encouraged her to use xanax as well if anxiety is coming to a head. 5. Neuropsych: This patientiscapable of making decisions on  herown behalf. 6. Skin/Wound Care:Routine skin checks             -continue foam dressing to sacrum             -nutrition better 7. Fluids/Electrolytes/Severe protein deficient malutrition: encourage appropriate PO    -added protein supp for low albumin   -appreciate RD help   -megace to continue 8. Acute blood loss anemia. Follow-up hgb 9.8 today. Continue iron supplement  -held iron supplement d/t nausea 9. Atrial fibrillation. Cardizem 30 mg every 6 hours, metoprolol 25 mg 3 times daily, losartan 25 mg daily, amiodarone 200 mg daily - sinus tachycardia 100's, sl improved.   -adjust chronotropics as needed based on activity tolerance -losartan resumed, bp soft/normal Vitals:   01/03/21 0408 01/03/21 0833  BP: 111/77 114/73  Pulse: (!) 109 (!) 104  Resp: 16 18  Temp: 97.9 F (36.6 C) 98.3 F (36.8 C)  SpO2: 97% 91%  10. Mild constipation   -  senna-s at bedtime---moving bowels   -nausea resolved 11.  Mild hypoxia improved on O2, still sob at rest and with activity  -Monday cxr demonstrated mild pulmonary edema  -6/3 continue lasix  40mg  daily for now   -bmet ok today   -need weight today   -f/u cxr   -consider cards f/u today pending cxr Filed Weights   12/23/20 1629 12/30/20 1625  Weight: 53.7 kg 58.6 kg     LOS: 11 days A FACE TO FACE EVALUATION WAS PERFORMED  Meredith Staggers 01/03/2021, 9:06 AM

## 2021-01-03 NOTE — Progress Notes (Signed)
Occupational Therapy Session Note  Patient Details  Name: Jennifer [name] MRN: [id number] Date of Birth: Jan 08, 1976  Today's Date: 01/03/2021 OT Individual Time: 2780-0447 OT Individual Time Calculation (min): 44 min    Short Term Goals: Week 2:  OT Short Term Goal 1 (Week 2): LTG=STG 2.2 ELOS  Skilled Therapeutic Interventions/Progress Updates:    Pt received in w/c in room on 2L O2 and consented to OT tx. Pt instructed in light BUE HEP within sternal precautions to increase strength and activity tolerance for ADLs and functional transfers. Pt instructed in unilateral elbow flexion with 1#db for 3x15 and shoulder flexion to 90* with 1#db for 3x10. Pt required extensive rest breaks in between exercises with HR increasing to 118, O2 100%, required time for HR to come back down. Pt then requested to use restroom, req max A to come to standing for SPT to [facility] positioned over toilet. Once standing, pt able to pivot with CGA-min A. Pt required assist for clothing mgmt, was able to complete her own hygiene while seated. After tx, pt left in room, up in w/c, on 2L O2, with VSS and all needs met.   Therapy Documentation Precautions:  Precautions Precautions: Sternal,Fall Restrictions Weight Bearing Restrictions: No   Vital Signs: Therapy Vitals Temp: 98.3 F (36.8 C) Temp Source: Oral Pulse Rate: (!) 104 Resp: 18 BP: 114/73 Patient Position (if appropriate): Lying Oxygen Therapy SpO2: 91 % O2 Device: Nasal Cannula O2 Flow Rate (L/min): 2 L/min Pain: none     Therapy/Group: Individual Therapy  Demetries Coia 01/03/2021, 11:05 AM

## 2021-01-03 NOTE — Progress Notes (Signed)
Physical Therapy Session Note  Patient Details  Name: Jennifer [name] MRN: [id number] Date of Birth: 26-Jul-1976  Today's Date: 01/03/2021 PT Individual Time: 3382-5053 PT Individual Time Calculation (min): 55 min   Short Term Goals: Week 2:  PT Short Term Goal 1 (Week 2): Pt will complete bed mobility with supervision and no bed features. PT Short Term Goal 2 (Week 2): Pt will complete sit to stand consistently with minA. PT Short Term Goal 3 (Week 2): Pt will ambulates 150' with CGA and LRAD. PT Short Term Goal 4 (Week 2): Pt will complete x4 steps with BHRs and minA.  Skilled Therapeutic Interventions/Progress Updates:     Pt received seated in [facility] and agrees to therapy. No complaint of pain and on 2L O2 via nasal cannula. WC transport outside for time management. Pt performs repeated reps of sit to stand for strengthening and transfer training. Pt generally requires modA at hips to stand without AD. PT manually facilitates increased gluteal activation. Pt able to perform x5 reps in succession before requiring extended seated rest break. Pt then ambulates x220' with RW over concrete with slight grades, with PT providing CGA and cues for upright gaze to improve posture and balance. Following rest break, pt performs stand step transfer from [facility] to outdoor bench table for practice with community reintegration. Pt performs x5 reps sit to stand from bench with modA.  Pt performs upper extremity ergometer at level 5.0 for strength and endurance training. Pt completes x4 minutes pedaling forward and x4 minutes pedaling backward, with several very brief rest breaks. PT cues for optimal muscular activation.  Pt performs stand step transfer back to bed with modA. Left seated at edge of bed with alarm intact and all needs within reach.   Therapy Documentation Precautions:  Precautions Precautions: Sternal,Fall Restrictions Weight Bearing Restrictions: No    Therapy/Group: Individual  Therapy  Breck Coons, PT, DPT 01/03/2021, 5:03 PM

## 2021-01-03 NOTE — Plan of Care (Signed)
  Problem: Consults Goal: RH GENERAL PATIENT EDUCATION Description: See Patient Education module for education specifics. Outcome: Progressing Goal: Skin Care Protocol Initiated - if Braden Score 18 or less Description: If consults are not indicated, leave blank or document N/A Outcome: Progressing   Problem: RH BOWEL ELIMINATION Goal: RH STG MANAGE BOWEL WITH ASSISTANCE Description: STG Manage Bowel with Supervision Assistance. Outcome: Progressing Goal: RH STG MANAGE BOWEL W/MEDICATION W/ASSISTANCE Description: STG Manage Bowel with Medication with Supervision Assistance. Outcome: Progressing   Problem: RH BLADDER ELIMINATION Goal: RH STG MANAGE BLADDER WITH ASSISTANCE Description: STG Manage Bladder With Supervision Assistance Outcome: Progressing   Problem: RH SKIN INTEGRITY Goal: RH STG MAINTAIN SKIN INTEGRITY WITH ASSISTANCE Description: STG Maintain Skin Integrity With Supervision Assistance. Outcome: Progressing Goal: RH STG ABLE TO PERFORM INCISION/WOUND CARE W/ASSISTANCE Description: STG Able To Perform Incision/Wound Care With Supervision Assistance. Outcome: Progressing   Problem: RH SAFETY Goal: RH STG ADHERE TO SAFETY PRECAUTIONS W/ASSISTANCE/DEVICE Description: STG Adhere to Safety Precautions With Supervision Assistance. Outcome: Progressing   Problem: RH PAIN MANAGEMENT Goal: RH STG PAIN MANAGED AT OR BELOW PT'S PAIN GOAL Description: < 3 on a 0-10 pain scale. Outcome: Progressing   Problem: RH KNOWLEDGE DEFICIT GENERAL Goal: RH STG INCREASE KNOWLEDGE OF SELF CARE AFTER HOSPITALIZATION Description: Patient will be able to demonstrate knowledge of medication management, pain management, skin/wound care with educational materials and handouts provided by staff, independently at discharge. Outcome: Progressing

## 2021-01-04 LAB — PROTIME-INR
INR: 2.2 — ABNORMAL HIGH (ref 0.8–1.2)
Prothrombin Time: 24.8 seconds — ABNORMAL HIGH (ref 11.4–15.2)

## 2021-01-04 MED ORDER — WARFARIN SODIUM 5 MG PO TABS
5.0000 mg | ORAL_TABLET | Freq: Once | ORAL | Status: AC
  Administered 2021-01-04: 5 mg via ORAL
  Filled 2021-01-04: qty 1

## 2021-01-04 NOTE — Progress Notes (Signed)
PROGRESS NOTE   Subjective/Complaints: Francis in place- she was able to wean to 1L today since satting 98% Urinating frequently during the day due to Lasix but this is tolerable to her- not needing to wake at night to urinate MEWS 3  ROS: Patient denies fever, rash, sore throat, blurred vision, nausea, vomiting, diarrhea, cough,   chest pain, joint or back pain, headache, or mood change.   Objective:   DG Chest 2 View  Result Date: 01/03/2021 CLINICAL DATA:  Increased shortness of breath last 2 days, prior open heart surgery EXAM: CHEST - 2 VIEW COMPARISON:  12/30/2020 FINDINGS: Prior median sternotomy and valve replacement. Enlargement of cardiac silhouette. Mediastinal contours and pulmonary vascularity normal. Small bibasilar pleural effusions and atelectasis greater on LEFT. Remaining lungs clear. Underlying emphysematous changes suspected. No pneumothorax. BILATERAL nipple shadows identified. Levoconvex thoracic scoliosis with evidence of prior anterior chest wall reconstruction IMPRESSION: Bibasilar pleural effusions and atelectasis greater on LEFT, little changed. Underlying emphysematous changes suspected. Enlargement of cardiac silhouette. Electronically Signed   By: Lavonia Dana M.D.   On: 01/03/2021 14:02   Recent Labs    01/03/21 0622  WBC 8.4  HGB 9.1*  HCT 32.1*  PLT 267   Recent Labs    01/03/21 0622  NA 141  K 5.0  CL 100  CO2 38*  GLUCOSE 98  BUN 14  CREATININE 0.38*  CALCIUM 8.9    Intake/Output Summary (Last 24 hours) at 01/04/2021 1728 Last data filed at 01/04/2021 0800 Gross per 24 hour  Intake 934 ml  Output --  Net 934 ml     Pressure Injury 12/23/20 Sacrum Mid;Medial Stage 2 -  Partial thickness loss of dermis presenting as a shallow open injury with a red, pink wound bed without slough. fissure like wound that is healing from a more round state (Active)  12/23/20 1626  Location: Sacrum  Location  Orientation: Mid;Medial  Staging: Stage 2 -  Partial thickness loss of dermis presenting as a shallow open injury with a red, pink wound bed without slough.  Wound Description (Comments): fissure like wound that is healing from a more round state  Present on Admission: Yes    Physical Exam: Vital Signs Blood pressure 96/66, pulse (!) 118, temperature 98.3 F (36.8 C), temperature source Oral, resp. rate 19, height 5\' 10"  (1.778 m), weight 60.5 kg, SpO2 98 %. Gen: no distress, normal appearing HEENT: oral mucosa pink and moist, NCAT Cardio: Tachycardia Chest: normal effort, normal rate of breathing Abd: soft, non-distended Ext: no clubbing, cyanosis, tr LE edema Psych: pleasant, a little flat Skin: sternal wound well healed with glue. Sacral wound esstly resolved Neuro: Pt is cognitively appropriate with normal insight, memory, and awareness. Cranial nerves 2-12 are intact. Sensory exam is normal. Reflexes are 2+ in all 4's. Fine motor coordination is intact. No tremors. Motor function is grossly 4/5 UE and 4-/5 LE. Musculoskeletal: Full ROM, No pain with AROM or PROM in the neck, trunk, or extremities. Posture appropriate     Assessment/Plan: 1. Functional deficits which require 3+ hours per day of interdisciplinary therapy in a comprehensive inpatient rehab setting.  Physiatrist is providing close team supervision and  24 hour management of active medical problems listed below.  Physiatrist and rehab team continue to assess barriers to discharge/monitor patient progress toward functional and medical goals  Care Tool:  Bathing    Body parts bathed by patient: Right arm,Left arm,Chest,Abdomen,Front perineal area,Buttocks,Right upper leg,Left upper leg,Face,Right lower leg,Left lower leg   Body parts bathed by helper: Left lower leg,Right lower leg     Bathing assist Assist Level: Supervision/Verbal cueing     Upper Body Dressing/Undressing Upper body dressing   What is the  patient wearing?: Pull over shirt    Upper body assist Assist Level: Supervision/Verbal cueing    Lower Body Dressing/Undressing Lower body dressing      What is the patient wearing?: Underwear/pull up,Pants     Lower body assist Assist for lower body dressing: Minimal Assistance - Patient > 75%     Toileting Toileting    Toileting assist Assist for toileting: Supervision/Verbal cueing     Transfers Chair/bed transfer  Transfers assist     Chair/bed transfer assist level: Minimal Assistance - Patient > 75%     Locomotion Ambulation   Ambulation assist      Assist level: Contact Guard/Touching assist Assistive device: Walker-rolling Max distance: 40   Walk 10 feet activity   Assist  Walk 10 feet activity did not occur: Safety/medical concerns  Assist level: Contact Guard/Touching assist Assistive device: Walker-rolling   Walk 50 feet activity   Assist Walk 50 feet with 2 turns activity did not occur: Safety/medical concerns  Assist level: Contact Guard/Touching assist Assistive device: Walker-rolling    Walk 150 feet activity   Assist Walk 150 feet activity did not occur: Safety/medical concerns  Assist level: Minimal Assistance - Patient > 75% Assistive device: Walker-rolling    Walk 10 feet on uneven surface  activity   Assist Walk 10 feet on uneven surfaces activity did not occur: Safety/medical concerns         Wheelchair     Assist Will patient use wheelchair at discharge?: No             Wheelchair 50 feet with 2 turns activity    Assist            Wheelchair 150 feet activity     Assist          Blood pressure 96/66, pulse (!) 118, temperature 98.3 F (36.8 C), temperature source Oral, resp. rate 19, height 5\' 10"  (1.778 m), weight 60.5 kg, SpO2 98 %.  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 Sternal precautions. -patient may shower -ELOS/Goals: 10-14 days, supervision with PT and supervision to min assist with OT  -Continue CIR therapies including PT, OT    2. Antithrombotics: -DVT/anticoagulation:Chronic Coumadin, INR therapeutic -continue dosing adjustments per pharmacy protocol  INR 2.2 on 6/4 -antiplatelet therapy: Aspirin 81 mg daily 3. Pain Management: ContinueHydrocodone as needed 4. Mood:Zoloft 25 mg daily, Wellbutrin 150 mg daily, melatonin 6 mg nightly Xanax as needed -antipsychotic agents: N/A  -discussed some coping skills for anxiety. Encouraged her to use xanax as well if anxiety is coming to a head. 5. Neuropsych: This patientiscapable of making decisions on herown behalf. 6. Skin/Wound Care:Routine skin checks             -continue foam dressing to sacrum             -nutrition better 7. Fluids/Electrolytes/Severe protein deficient malutrition: encourage appropriate PO    -  added protein supp for low albumin   -appreciate RD help   -megace to continue 8. Acute blood loss anemia. Follow-up hgb 9.8 today. Continue iron supplement  -held iron supplement d/t nausea 9. Atrial fibrillation. Cardizem 30 mg every 6 hours, metoprolol 25 mg 3 times daily, losartan 25 mg daily, amiodarone 200 mg daily - sinus tachycardia 100's, sl improved.   -adjust chronotropics as needed based on activity tolerance -losartan resumed, bp soft/normal  6/4: HR up to 118. BP 91/66, consulted cardiology for advice regarding most effective medication regimen for her Vitals:   01/04/21 1206 01/04/21 1639  BP: 91/66 96/66  Pulse: (!) 118 (!) 118  Resp: 19 19  Temp: 98.4 F (36.9 C) 98.3 F (36.8 C)  SpO2: 98% 98%   10. Mild constipation   -  senna-s at bedtime---moving bowels   -nausea resolved 11.  Mild hypoxia improved on O2, still sob at rest and  with activity  -Monday cxr demonstrated mild pulmonary edema  -6/3 continue lasix  40mg  daily for now   -bmet ok today   -need weight today   -CXR stable   6/4: weight down, continue lasix Filed Weights   12/30/20 1625 01/03/21 1159 01/04/21 0625  Weight: 58.6 kg 62.4 kg 60.5 kg     LOS: 12 days A FACE TO FACE EVALUATION WAS PERFORMED  Janiylah Hannis P Emmanual Gauthreaux 01/04/2021, 5:28 PM

## 2021-01-04 NOTE — Progress Notes (Signed)
[place] for Coumadin  Indication: St Jude MVR  No Known Allergies  Patient Measurements: Height: 5\' 10"  (177.8 cm) Weight: 60.5 kg (133 lb 6.1 oz) IBW/kg (Calculated) : 68.5  Vital Signs: Temp: 98.4 F (36.9 C) (06/04 1206) BP: 91/66 (06/04 1206) Pulse Rate: 118 (06/04 1206)  Labs: Recent Labs    01/02/21 0503 01/03/21 0622 01/04/21 0513  HGB  --  9.1*  --   HCT  --  32.1*  --   PLT  --  267  --   LABPROT 26.4* 27.6* 24.8*  INR 2.4* 2.6* 2.2*  CREATININE  --  0.38*  --     Estimated Creatinine Clearance: 86.6 mL/min (A) (by C-G formula based on SCr of 0.38 mg/dL (L)).   Medical History: Past Medical History:  Diagnosis Date  . 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 Goal INR 2.5-3.5  CBC qMon: Hgb 9.1 relatively stable. Plts stable 260s.  INR now subtherapeutic at 2.2. Will give slightly higher dose today.   Goal of Therapy:  INR 2.5-3.5 Monitor platelets by anticoagulation protocol: Yes   Plan:  - Coumadin 5mg  PO x1 - Daily INR  Rebbeca Paul, PharmD PGY1 Pharmacy Resident 01/04/2021 1:32 PM  Please check [email or web address] for unit-specific pharmacy phone numbers.

## 2021-01-04 NOTE — Progress Notes (Signed)
Occupational Therapy Session Note  Patient Details  Name: Jennifer [name] MRN: [id number] Date of Birth: 1975-12-15  55 min 1105-1200    Short Term Goals: Week 1:  OT Short Term Goal 1 (Week 1): Pt will complete stand-pivot to [facility] with mod OT Short Term Goal 1 - Progress (Week 1): Met OT Short Term Goal 2 (Week 1): Pt will tolerate standing for 2 minutes within BADL task OT Short Term Goal 2 - Progress (Week 1): Met OT Short Term Goal 3 (Week 1): Pt will complete 1 step of LB dressing task OT Short Term Goal 3 - Progress (Week 1): Met  Skilled Therapeutic Interventions/Progress Updates:     Pt received in w/c agreeable to OT.   Therapeutic exercise 2x5 sit<>stand in parallel bars with emphasis on stand>sit to improve strengthening by controlled decent to w/c. Level 1 theraband added for glute activation duing full ROM.   Seated hip abduciton (level 1 theraband) and adduction ball squeeze and LAQ all in prep for strengthening Les for functional transfers and mobility during sit to stand.   Therapeutic activity edu re energy conservation in ADL apartment to improve adaptations for ADLs and IADLs at home with showing pt use of walker tray, reacher bag and reacher for imrpoved transporting of items. Pt able ot verbalize strategies that she could see using at home.  Pt left at end of session in w/c with exit alarm on, call light in reach and all needs met   Therapy Documentation Precautions:  Precautions Precautions: Sternal,Fall Restrictions Weight Bearing Restrictions: No General:   Vital Signs: Therapy Vitals Temp: 97.9 F (36.6 C) Pulse Rate: (!) 113 Resp: 16 BP: 107/68 Patient Position (if appropriate): Lying Oxygen Therapy SpO2: 97 % O2 Device: Nasal Cannula Pain:   ADL: ADL Eating: Set up Grooming: Setup Upper Body Bathing: Minimal assistance Lower Body Bathing: Maximal assistance Upper Body Dressing: Moderate assistance Lower Body Dressing: Maximal  assistance Toileting: Maximal assistance Toilet Transfer: Moderate assistance Vision   Perception    Praxis   Exercises:   Other Treatments:     Therapy/Group: Individual Therapy  Tonny Branch 01/04/2021, 6:48 AM

## 2021-01-04 NOTE — Progress Notes (Signed)
Physical Therapy Session Note  Patient Details  Name: Jennifer [name] MRN: [id number] Date of Birth: 1975/10/27  Today's Date: 01/04/2021 PT Individual Time: 0905-1000 PT Individual Time Calculation (min): 55 min   Short Term Goals: Week 2:  PT Short Term Goal 1 (Week 2): Pt will complete bed mobility with supervision and no bed features. PT Short Term Goal 2 (Week 2): Pt will complete sit to stand consistently with minA. PT Short Term Goal 3 (Week 2): Pt will ambulates 150' with CGA and LRAD. PT Short Term Goal 4 (Week 2): Pt will complete x4 steps with BHRs and minA.  Skilled Therapeutic Interventions/Progress Updates:    Patient received sitting on EOB receiving AM rx from RN. She denies pain, but endorses fatigue. Patient remaining on 2L O2 via [place]- transferred to tank. She was able to transfer to wc via stand pivot with MinA from raised bed height. PT transporting patient in wc to therapy gym for time management and energy conservation. She ambulated 66ft with RW and CGA 2L O2. She reported 5/10 on RPE and O2 100%, HR 121BPM. After extended rest break, HR only down to 115BPM. Patient reports that that has been her norm lately. She completed blocked practice sit <> stand from varying mat height with grossly CGA and verbal cues for anterior weight shift. First 2 bouts of 3 compelted on 2L O2 with O2 remaining at 100%. Titrated down to 1L O2 via [place] and patient maintained oxygenation at 95-96% with the rest of activity. Patient reports sit <> stand from mat as 3-4/10 on RPE. PT educating patient on energy conservation and appropriate home modifications upon d/c. She ambulated additional 48ft with RW, CGA and 1L O2. Upon sitting, O2 at 91%, but rebounded to >95% in <30s. Patient returning to room in wc, seatbelt alarm on, call light within reach.    Therapy Documentation Precautions:  Precautions Precautions: Sternal,Fall Restrictions Weight Bearing Restrictions: No    Therapy/Group:  Individual Therapy  Karoline Caldwell, PT, DPT, CBIS  01/04/2021, 7:40 AM

## 2021-01-04 NOTE — Plan of Care (Signed)
  Problem: Consults Goal: RH GENERAL PATIENT EDUCATION Description: See Patient Education module for education specifics. Outcome: Progressing Goal: Skin Care Protocol Initiated - if Braden Score 18 or less Description: If consults are not indicated, leave blank or document N/A Outcome: Progressing   Problem: RH BOWEL ELIMINATION Goal: RH STG MANAGE BOWEL WITH ASSISTANCE Description: STG Manage Bowel with Supervision Assistance. Outcome: Progressing Goal: RH STG MANAGE BOWEL W/MEDICATION W/ASSISTANCE Description: STG Manage Bowel with Medication with Supervision Assistance. Outcome: Progressing   Problem: RH BLADDER ELIMINATION Goal: RH STG MANAGE BLADDER WITH ASSISTANCE Description: STG Manage Bladder With Supervision Assistance Outcome: Progressing   Problem: RH SKIN INTEGRITY Goal: RH STG MAINTAIN SKIN INTEGRITY WITH ASSISTANCE Description: STG Maintain Skin Integrity With Supervision Assistance. Outcome: Progressing Goal: RH STG ABLE TO PERFORM INCISION/WOUND CARE W/ASSISTANCE Description: STG Able To Perform Incision/Wound Care With Supervision Assistance. Outcome: Progressing   Problem: RH SAFETY Goal: RH STG ADHERE TO SAFETY PRECAUTIONS W/ASSISTANCE/DEVICE Description: STG Adhere to Safety Precautions With Supervision Assistance. Outcome: Progressing   Problem: RH PAIN MANAGEMENT Goal: RH STG PAIN MANAGED AT OR BELOW PT'S PAIN GOAL Description: < 3 on a 0-10 pain scale. Outcome: Progressing   Problem: RH KNOWLEDGE DEFICIT GENERAL Goal: RH STG INCREASE KNOWLEDGE OF SELF CARE AFTER HOSPITALIZATION Description: Patient will be able to demonstrate knowledge of medication management, pain management, skin/wound care with educational materials and handouts provided by staff, independently at discharge. Outcome: Progressing

## 2021-01-05 LAB — PROTIME-INR
INR: 2.4 — ABNORMAL HIGH (ref 0.8–1.2)
Prothrombin Time: 25.7 seconds — ABNORMAL HIGH (ref 11.4–15.2)

## 2021-01-05 MED ORDER — WARFARIN SODIUM 5 MG PO TABS
5.0000 mg | ORAL_TABLET | Freq: Once | ORAL | Status: AC
  Administered 2021-01-05: 5 mg via ORAL
  Filled 2021-01-05: qty 1

## 2021-01-05 NOTE — Progress Notes (Signed)
PROGRESS NOTE   Subjective/Complaints: MEWS 3 +SOB, stable Sitting and talking with her mother.  HR up to 122 when working with therapy, felt a little lightheaded, similar to usual  ROS: Patient denies fever, rash, sore throat, blurred vision, nausea, vomiting, diarrhea, cough,   chest pain, joint or back pain, headache, or mood change.  +SOB  Objective:   No results found. Recent Labs    01/03/21 0622  WBC 8.4  HGB 9.1*  HCT 32.1*  PLT 267   Recent Labs    01/03/21 0622  NA 141  K 5.0  CL 100  CO2 38*  GLUCOSE 98  BUN 14  CREATININE 0.38*  CALCIUM 8.9    Intake/Output Summary (Last 24 hours) at 01/05/2021 1402 Last data filed at 01/05/2021 1305 Gross per 24 hour  Intake 818 ml  Output --  Net 818 ml     Pressure Injury 12/23/20 Sacrum Mid;Medial Stage 2 -  Partial thickness loss of dermis presenting as a shallow open injury with a red, pink wound bed without slough. fissure like wound that is healing from a more round state (Active)  12/23/20 1626  Location: Sacrum  Location Orientation: Mid;Medial  Staging: Stage 2 -  Partial thickness loss of dermis presenting as a shallow open injury with a red, pink wound bed without slough.  Wound Description (Comments): fissure like wound that is healing from a more round state  Present on Admission: Yes    Physical Exam: Vital Signs Blood pressure 97/70, pulse (!) 122, temperature 98.2 F (36.8 C), resp. rate 20, height 5\' 10"  (1.778 m), weight 60.7 kg, SpO2 99 %. Gen: no distress, normal appearing HEENT: oral mucosa pink and moist, NCAT Cardio: Tachycardia Chest: normal effort, normal rate of breathing Abd: soft, non-distended Ext: no clubbing, cyanosis, tr LE edema Psych: pleasant, a little flat Skin: sternal wound well healed with glue. Sacral wound esstly resolved Neuro: Pt is cognitively appropriate with normal insight, memory, and awareness. Cranial  nerves 2-12 are intact. Sensory exam is normal. Reflexes are 2+ in all 4's. Fine motor coordination is intact. No tremors. Motor function is grossly 4/5 UE and 4-/5 LE. Musculoskeletal: Full ROM, No pain with AROM or PROM in the neck, trunk, or extremities. Posture appropriate     Assessment/Plan: 1. Functional deficits which require 3+ hours per day of interdisciplinary therapy in a comprehensive inpatient rehab setting.  Physiatrist is providing close team supervision and 24 hour management of active medical problems listed below.  Physiatrist and rehab team continue to assess barriers to discharge/monitor patient progress toward functional and medical goals  Care Tool:  Bathing    Body parts bathed by patient: Right arm,Left arm,Chest,Abdomen,Front perineal area,Buttocks,Right upper leg,Left upper leg,Face,Right lower leg,Left lower leg   Body parts bathed by helper: Left lower leg,Right lower leg     Bathing assist Assist Level: Supervision/Verbal cueing     Upper Body Dressing/Undressing Upper body dressing   What is the patient wearing?: Pull over shirt    Upper body assist Assist Level: Supervision/Verbal cueing    Lower Body Dressing/Undressing Lower body dressing      What is the patient wearing?: Underwear/pull up,Pants  Lower body assist Assist for lower body dressing: Minimal Assistance - Patient > 75%     Toileting Toileting    Toileting assist Assist for toileting: Supervision/Verbal cueing     Transfers Chair/bed transfer  Transfers assist     Chair/bed transfer assist level: Minimal Assistance - Patient > 75%     Locomotion Ambulation   Ambulation assist      Assist level: Contact Guard/Touching assist Assistive device: Walker-rolling Max distance: 40   Walk 10 feet activity   Assist  Walk 10 feet activity did not occur: Safety/medical concerns  Assist level: Contact Guard/Touching assist Assistive device: Walker-rolling    Walk 50 feet activity   Assist Walk 50 feet with 2 turns activity did not occur: Safety/medical concerns  Assist level: Contact Guard/Touching assist Assistive device: Walker-rolling    Walk 150 feet activity   Assist Walk 150 feet activity did not occur: Safety/medical concerns  Assist level: Minimal Assistance - Patient > 75% Assistive device: Walker-rolling    Walk 10 feet on uneven surface  activity   Assist Walk 10 feet on uneven surfaces activity did not occur: Safety/medical concerns         Wheelchair     Assist Will patient use wheelchair at discharge?: No             Wheelchair 50 feet with 2 turns activity    Assist            Wheelchair 150 feet activity     Assist          Blood pressure 97/70, pulse (!) 122, temperature 98.2 F (36.8 C), resp. rate 20, height 5\' 10"  (1.778 m), weight 60.7 kg, SpO2 99 %.  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 Sternal precautions. -patient may shower -ELOS/Goals: 10-14 days, supervision with PT and supervision to min assist with OT  -Continue CIR therapies including PT, OT    2. Antithrombotics: -DVT/anticoagulation:Chronic Coumadin, INR therapeutic -continue dosing adjustments per pharmacy protocol  INR 2.4 on 6/5, monitor daily -antiplatelet therapy: Aspirin 81 mg daily 3. Pain Management: ContinueHydrocodone as needed 4. Mood:Zoloft 25 mg daily, Wellbutrin 150 mg daily, melatonin 6 mg nightly Xanax as needed -antipsychotic agents: N/A  -discussed some coping skills for anxiety. Encouraged her to use xanax as well if anxiety is coming to a head.  -anxiety is well controlled on 6/5 5. Neuropsych: This patientiscapable of making decisions on herown behalf. 6. Skin/Wound Care:Routine skin checks              -continue foam dressing to sacrum             -nutrition better 7. Fluids/Electrolytes/Severe protein deficient malutrition: encourage appropriate PO    -added protein supp for low albumin   -appreciate RD help   -megace to continue 8. Acute blood loss anemia. Follow-up hgb 9.8 today. Continue iron supplement  -held iron supplement d/t nausea 9. Atrial fibrillation. Cardizem 30 mg every 6 hours, metoprolol 25 mg 3 times daily, losartan 25 mg daily, amiodarone 200 mg daily - sinus tachycardia 100's, sl improved.   -adjust chronotropics as needed based on activity tolerance -losartan resumed, bp soft/normal  6/5: HR up to 122 with activity. Symptoms stable. Discussed current medication regimen with her. Her outpatient cardiologist is Dr. Marko Plume at [facility] who specializes in Marfan Syndrome.  Vitals:   01/05/21 0800 01/05/21 1206  BP: 102/74 97/70  Pulse: Marland Kitchen)  117 (!) 122  Resp: 20 20  Temp: 98 F (36.7 C) 98.2 F (36.8 C)  SpO2: 96% 99%   10. Mild constipation   -  senna-s at bedtime---moving bowels   -nausea resolved 11.  Mild hypoxia improved on O2, still sob at rest and with activity  -Monday cxr demonstrated mild pulmonary edema  -6/3 continue lasix  40mg  daily for now   -bmet ok today   -need weight today   -CXR stable   6/5: weight continues to be lower, stable, discussed with her and she was happy to hear this.  Filed Weights   01/03/21 1159 01/04/21 0625 01/05/21 0500  Weight: 62.4 kg 60.5 kg 60.7 kg     LOS: 13 days A FACE TO FACE EVALUATION WAS PERFORMED  Rainee Sweatt P Alivia Cimino 01/05/2021, 2:02 PM

## 2021-01-05 NOTE — Progress Notes (Signed)
[place] for Coumadin  Indication: St Jude MVR  No Known Allergies  Patient Measurements: Height: 5\' 10"  (177.8 cm) Weight: 60.7 kg (133 lb 13.1 oz) IBW/kg (Calculated) : 68.5  Vital Signs: Temp: 97.8 F (36.6 C) (06/05 0400) Temp Source: Oral (06/05 0400) BP: 111/74 (06/05 0400) Pulse Rate: 114 (06/05 0400)  Labs: Recent Labs    01/03/21 0622 01/04/21 0513 01/05/21 0604  HGB 9.1*  --   --   HCT 32.1*  --   --   PLT 267  --   --   LABPROT 27.6* 24.8* 25.7*  INR 2.6* 2.2* 2.4*  CREATININE 0.38*  --   --     Estimated Creatinine Clearance: 86.9 mL/min (A) (by C-G formula based on SCr of 0.38 mg/dL (L)).   Medical History: Past Medical History:  Diagnosis Date  . 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 Goal INR 2.5-3.5  CBC qMon: Hgb 9.1 relatively stable. Plts stable 260s.  INR still slightly subtherapeutic at 2.4. Will give slightly higher dose again today.   Goal of Therapy:  INR 2.5-3.5 Monitor platelets by anticoagulation protocol: Yes   Plan:  - Coumadin 5mg  PO x1 - Daily INR - qMonday CBC  Rebbeca Paul, PharmD PGY1 Pharmacy Resident 01/05/2021 7:25 AM  Please check [email or web address] for unit-specific pharmacy phone numbers.

## 2021-01-05 NOTE — Plan of Care (Signed)
  Problem: Consults Goal: RH GENERAL PATIENT EDUCATION Description: See Patient Education module for education specifics. Outcome: Progressing Goal: Skin Care Protocol Initiated - if Braden Score 18 or less Description: If consults are not indicated, leave blank or document N/A Outcome: Progressing   Problem: RH BOWEL ELIMINATION Goal: RH STG MANAGE BOWEL WITH ASSISTANCE Description: STG Manage Bowel with Supervision Assistance. Outcome: Progressing Goal: RH STG MANAGE BOWEL W/MEDICATION W/ASSISTANCE Description: STG Manage Bowel with Medication with Supervision Assistance. Outcome: Progressing   Problem: RH BLADDER ELIMINATION Goal: RH STG MANAGE BLADDER WITH ASSISTANCE Description: STG Manage Bladder With Supervision Assistance Outcome: Progressing   Problem: RH SKIN INTEGRITY Goal: RH STG MAINTAIN SKIN INTEGRITY WITH ASSISTANCE Description: STG Maintain Skin Integrity With Supervision Assistance. Outcome: Progressing Goal: RH STG ABLE TO PERFORM INCISION/WOUND CARE W/ASSISTANCE Description: STG Able To Perform Incision/Wound Care With Supervision Assistance. Outcome: Progressing   Problem: RH SAFETY Goal: RH STG ADHERE TO SAFETY PRECAUTIONS W/ASSISTANCE/DEVICE Description: STG Adhere to Safety Precautions With Supervision Assistance. Outcome: Progressing   Problem: RH PAIN MANAGEMENT Goal: RH STG PAIN MANAGED AT OR BELOW PT'S PAIN GOAL Description: < 3 on a 0-10 pain scale. Outcome: Progressing   Problem: RH KNOWLEDGE DEFICIT GENERAL Goal: RH STG INCREASE KNOWLEDGE OF SELF CARE AFTER HOSPITALIZATION Description: Patient will be able to demonstrate knowledge of medication management, pain management, skin/wound care with educational materials and handouts provided by staff, independently at discharge. Outcome: Progressing

## 2021-01-05 NOTE — Progress Notes (Signed)
Physical Therapy Session Note  Patient Details  Name: Jennifer [name] MRN: [id number] Date of Birth: 1976/04/23  Today's Date: 01/05/2021 PT Individual Time: 0800-0900 PT Individual Time Calculation (min): 60 min   Short Term Goals: Week 2:  PT Short Term Goal 1 (Week 2): Pt will complete bed mobility with supervision and no bed features. PT Short Term Goal 2 (Week 2): Pt will complete sit to stand consistently with minA. PT Short Term Goal 3 (Week 2): Pt will ambulates 150' with CGA and LRAD. PT Short Term Goal 4 (Week 2): Pt will complete x4 steps with BHRs and minA.  Skilled Therapeutic Interventions/Progress Updates:     Patient sitting EOB upon PT arrival. Patient alert and agreeable to PT session. Patient denied pain during session. Patient reported that she was told she would have a rest day today. Reviewed patient's schedule and noted this was the patient's only scheduled session today. Patient in agreement to participate. Asked to work on transfers in preparation for d/c this week.   Patient on 1.5L/min at beginning of session, required 1.5L/min throughout session. Increased to 2L/min in prone for comfort. SPO2 >92% throughout session, HR remained in 120s in sitting and standing 100s in lying. RN aware of elevated HR and provided medications during session.  Therapeutic Activity: Patient applied lotion to dry skin on lower extremities then donned B TED hose and tennis shoes with set-up assist. Patient doffed tennis shoes and donned non-skid socks with set-up assist. Bed Mobility: Patient performed rolling supine to prone and supine to/from sit with supervision-mod I for increased time and use of rails in a flat bed. Provided verbal cues for arm placement with rolling prone.  Neuromuscular Re-ed: Patient performed blocked practice sit to stands from EOB with use of B upper extremities 5x2 from decreasing bed elevation from 35" to 24" from the ground, focused on forward weight shift  with facilitation at base of neck and R IT to promote COM over BOS prior to standing for improved balance and motor control, focused on eccentric muscle activation on lowering with B progressing to single upper extremity support for strengthening and controlled movement with a functional activity. Patient required CGA-min A throughout for facilitation with mobility  Therapeutic Exercise: Patient performed the following exercises with verbal and tactile cues for proper technique. -seated marching 2x3 min -mini-squats 2x5 -prone lying with 1 pillow under he hips for lumbar/thoracic extension x4 min and no pillow x1 min; educated on performing activity progressing to 15 min/day due to increased thoracic kyphosis  Patient sitting EOB  at end of session with breaks locked, bed alarm set, and all needs within reach.    Therapy Documentation Precautions:  Precautions Precautions: Sternal,Fall Restrictions Weight Bearing Restrictions: No   Therapy/Group: Individual Therapy  Mykel Sponaugle L Jeovany Huitron PT, DPT  01/05/2021, 12:41 PM

## 2021-01-06 LAB — BASIC METABOLIC PANEL
Anion gap: 4 — ABNORMAL LOW (ref 5–15)
BUN: 13 mg/dL (ref 6–20)
CO2: 41 mmol/L — ABNORMAL HIGH (ref 22–32)
Calcium: 9 mg/dL (ref 8.9–10.3)
Chloride: 97 mmol/L — ABNORMAL LOW (ref 98–111)
Creatinine, Ser: 0.45 mg/dL (ref 0.44–1.00)
GFR, Estimated: >60 mL/min (ref >60)
Glucose, Bld: 105 mg/dL — ABNORMAL HIGH (ref 70–99)
Potassium: 4 mmol/L (ref 3.5–5.1)
Sodium: 142 mmol/L (ref 135–145)

## 2021-01-06 LAB — CBC
HCT: 33 % — ABNORMAL LOW (ref 36.0–46.0)
Hemoglobin: 9.4 g/dL — ABNORMAL LOW (ref 12.0–15.0)
MCH: 28.5 pg (ref 26.0–34.0)
MCHC: 28.5 g/dL — ABNORMAL LOW (ref 30.0–36.0)
MCV: 100 fL (ref 80.0–100.0)
Platelets: 266 10*3/uL (ref 150–400)
RBC: 3.3 MIL/uL — ABNORMAL LOW (ref 3.87–5.11)
RDW: 17.3 % — ABNORMAL HIGH (ref 11.5–15.5)
WBC: 8.4 10*3/uL (ref 4.0–10.5)
nRBC: 0 % (ref 0.0–0.2)

## 2021-01-06 LAB — PROTIME-INR
INR: 2.8 — ABNORMAL HIGH (ref 0.8–1.2)
Prothrombin Time: 29.3 seconds — ABNORMAL HIGH (ref 11.4–15.2)

## 2021-01-06 MED ORDER — WARFARIN SODIUM 5 MG PO TABS
5.0000 mg | ORAL_TABLET | Freq: Once | ORAL | Status: AC
  Administered 2021-01-06: 5 mg via ORAL
  Filled 2021-01-06: qty 1

## 2021-01-06 NOTE — Progress Notes (Signed)
ANTICOAGULATION CONSULT NOTE - Follow Up Consult  Pharmacy Consult for Coumadin Indication: St Jude MVR  No Known Allergies  Patient Measurements: Height: 5\' 10"  (177.8 cm) Weight: 61.5 kg (135 lb 9.3 oz) IBW/kg (Calculated) : 68.5  Vital Signs: Temp: 98.2 F (36.8 C) (06/06 0406) Temp Source: Oral (06/06 0406) BP: 110/78 (06/06 0406) Pulse Rate: 106 (06/06 0406)  Labs: Recent Labs    01/04/21 0513 01/05/21 0604 01/06/21 0718  HGB  --   --  9.4*  HCT  --   --  33.0*  PLT  --   --  266  LABPROT 24.8* 25.7* 29.3*  INR 2.2* 2.4* 2.8*  CREATININE  --   --  0.45    Estimated Creatinine Clearance: 88 mL/min (by C-G formula based on SCr of 0.45 mg/dL).   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 Goal INR 2.5-3.5.  CBC qMon: Hgb 9.4 relatively stable. Plts stable 260s  - INR 2.8  Goal of Therapy:  INR 2.5-3.5 Monitor platelets by anticoagulation protocol: Yes   Plan:  Coumadin 5mg  PO x1 Daily INR, qMon CBC   William Schake S. Alford Highland, PharmD, BCPS Clinical Staff Pharmacist [email or web address] Alford Highland, [employer] 01/06/2021,8:54 AM

## 2021-01-06 NOTE — Progress Notes (Signed)
Physical Therapy Session Note  Patient Details  Name: Jennifer [name] MRN: [id number] Date of Birth: [birth year]  Today's Date: 01/06/2021 PT Individual Time: 1330-1430  And 800-900 PT Individual Time Calculation (min): 60 min and 60 min  Short Term Goals: Week 1:  PT Short Term Goal 1 (Week 1): Pt will perform bed mobility with minA. PT Short Term Goal 1 - Progress (Week 1): Met PT Short Term Goal 2 (Week 1): Pt will perform sit to stand with minA. PT Short Term Goal 2 - Progress (Week 1): Progressing toward goal PT Short Term Goal 3 (Week 1): Pt will ambulate x50' with minA and LRAD. PT Short Term Goal 3 - Progress (Week 1): Met PT Short Term Goal 4 (Week 1): Pt will initiate stair training. PT Short Term Goal 4 - Progress (Week 1): Met Week 2:  PT Short Term Goal 1 (Week 2): Pt will complete bed mobility with supervision and no bed features. PT Short Term Goal 2 (Week 2): Pt will complete sit to stand consistently with minA. PT Short Term Goal 3 (Week 2): Pt will ambulates 150' with CGA and LRAD. PT Short Term Goal 4 (Week 2): Pt will complete x4 steps with BHRs and minA. Week 3:     Skilled Therapeutic Interventions/Progress Updates:    am session Pain:  Pt reports no pain.  Treatment to tolerance.  Rest breaks and repositioning as needed.  Pt initially seated on edge of bed w/nursing and agreeable to treatment session w/focus on global strengthening.  Therapist dons Ted hose for time management. Sit to stand from elevated bed w/min assist to RW.  Short distance gait to BR, commode transfer w/cga to min assist.  Continent of urine and independent w/hygiene, cga for clothing management in standing. Gait 169f to dayroom w/cga w/RW.  Functional Strengthening: Wall sits at approx 45* knee bend x 10 sec hold repeatd x 4 for quad/glut strengthening  Wall squats (no hold time) x 10  W/L1 theraband resistance to hip abd, pt performs Sit to stand from elevated mat, sidestepping against  resistance L and R, returns to sitting maintaining resistance   X  8 reps to promote hip stability w/quad and glut activation. Repeated x 2 sets HR 122, 02 sats on 1L 93%  Seated ankle DF/PF Standing toe raises x 10 w/RW  Sit to stand to tall kneeling on mat w/min to mod assist, additional time, use of bench on mat for stability. Worked on tall kneeling to partial squat x 6 Tall kneeing w/bilat overhead reach and hold x 5 sec repeated x 6 for core stability  02sats 99% Gait 1559fw/RW, cga as above.  Turn/sit to edge of bed w/cga.  Pt left seated on edge of bed w/rails up x 3, alarm set, bed in lowest position, and needs in reach.  Pt w/significantly improved Sit to stand from elevated surfaces, improving from lower heights.  Good progress functionally.     PM SESSION Pain:  Pt reports no pain.  Treatment to tolerance.  Rest breaks and repositioning as needed.  Pt initially oob in wc w/family present and agreeable to treatment session w/focus on family training. Educated husband regarding use of supplemental 02 and consistent need for this w/exertion in sessions, consistent monitoring by therapists to guide use.   Pt transported to gym.  Discussed difficulty w/Sit to stand and method for assisting w/this. Gait: Husband, daughter, son, educated w/guarding technique for gait. Pt husband then provides safe assist for Sit  to stand from wc and gait x 31f w/RW. Attempted gait without supplemental 02, 02sats declined to 74% on room air.  Pt placed back on 2L 02 and recovers in approx 30-45 sec to 90s.  Stairs: Discussed home set up, guarding technique, then therapist practiced ascending/descending w/husband acting as patient. Therapist then assisted pt w/ascending/descending 4 stairs w/2 rails overall min assist and additional time for task. Husband then assisted pt w/ascending/descending 8 stairs w/2 rails, minimal cues needed for safety, overal pt required min assist. Husband  w/questions regarding elevated resting HR, deferred to MD for this discussion/education.    Car transfer: Discussed safe technique and demonstrated using RW.  Husband then demonstrated safe guarding of pt  w/wc to/from car with no further cueing or assistance needed.    Son and daughter each particpated w/guarding pt w/Sit to stand and gait training which she performed as follows: Sit to stand w/min to mod assist. Gait x 929f 15053f/cga and RW.  Discussed importance of continuing w/walking program at home w/assistance, discussed options for safe path at home.    At end of session, Pt left oob in wc w/alarm belt set and needs in reach Family very receptive to training and demonstrated safe technique w/gait, car transfer, and stairs.  Therapy Documentation Precautions:  Precautions Precautions: Sternal,Fall Restrictions Weight Bearing Restrictions: No    Therapy/Group: Individual Therapy  BarCallie FieldingT [place]6/2022, 3:21 PM

## 2021-01-06 NOTE — Progress Notes (Signed)
Occupational Therapy Session Note  Patient Details  Name: Jennifer [name] MRN: [id number] Date of Birth: [birth year]  Today's Date: 01/06/2021  Session 1 OT Individual Time: 1003-1100 OT Individual Time Calculation (min): 57 min   Session 2 OT Individual Time: 3254-9826 OT Individual Time Calculation (min): 28 min    Short Term Goals: Week 2:  OT Short Term Goal 1 (Week 2): LTG=STG 2.2 ELOS  Skilled Therapeutic Interventions/Progress Updates:    Pt greeted seated EOB and agreeable to OT treatment session. Pt needed min A to get to standing from EOB. She then ambulated in room to collect clothing with education for RW management to access suitcase placed on recliner. Pt ambulated the bathroom w/ RW and CGA. Pt undressed from shower chair with supervision. Bathing completed with overall supervision/set-up A using lateral leans to wash buttocks and figure 4 to wash feet. Min A to get to standing from tub bench with use of grab bars. Dressing from wc using figure 4 position. Min A to stand to pull up pants. Worked on standing balance/endurance with standing grooming tasks.Pt then completed 5 sit<>stands in a row with min A.  Pt maintained on 1.5L throughout session. Pt left seated in wc with alarm belt on, call bell in reach, and needs met.   Session 2 Pt greeted seated in wc with Son, Daughter, and Husband present for family education. Pt brought down to tub room and OT educated on tub bench transfer. OT demonstrated technique, then had husband assist with sit<>stand and CGA to ambulate into bathroom and sit on tub bench. Pt able to lift B LEs over tub ledge. OT reviewed home bathroom set-up and use of 3-in-1 BSC to toilet transfers. Educated on energy conservation techniques, current O2 needs, sit<>stands, and modifications for safe BADL participation. Pt then ambulated back towards room with family taking turns providing CGA with ambulation and RW. Pt left seated in wc at end of session with family  present and needs met.   Therapy Documentation Precautions:  Precautions Precautions: Sternal,Fall Restrictions Weight Bearing Restrictions: No Pain:  denies pain   Therapy/Group: Individual Therapy  Valma Cava 01/06/2021, 3:34 PM

## 2021-01-06 NOTE — Progress Notes (Signed)
Nutrition Follow-up  DOCUMENTATION CODES:   Severe malnutrition in context of chronic illness,Underweight  INTERVENTION:  ContinueEnsure Enlive po BID, each supplement provides 350 kcal and 20 grams of protein  ContinueEnsure Max poonce daily, each supplement provides 150 kcal and 30 grams of protein.  Encourage adequate PO intake.  NUTRITION DIAGNOSIS:   Severe Malnutrition related to chronic illness as evidenced by severe fat depletion,severe muscle depletion; ongoing  GOAL:   Patient will meet greater than or equal to 90% of their needs; met  MONITOR:   PO intake,Supplement acceptance,Skin,Weight trends,Labs,I & O's  REASON FOR ASSESSMENT:   Malnutrition Screening Tool    ASSESSMENT:   45 year old right-handed female with history of congenital pectus excavatum, Marfan syndrome, mitral valve prolapse and known mild to moderate mitral regurgitation. CTA chest no pulmonary emboli however findings compatible fluid overload including cardiomegaly and small pericardial effusion small bilateral pleural effusion pulmonary interstitial edema.  CT abdomen pelvis with contrast demonstrated small volume ascites. Patient did undergo diuresis. EE showed endocarditis with mildly decreased ejection fraction of 45 to 50%. 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 Pt with decreased functional mobility was admitted for a comprehensive rehab program.  Meal completion has been 85-100%. Appetite has improved. Pt currently has Ensure ordered and has been consuming them. RD to continue with current orders to aid in caloric and protein needs. Pt currently on lasix diuresing.   Labs and medications reviewed.   Diet Order:   Diet Order            Diet regular Room service appropriate? Yes; Fluid consistency: Thin  Diet effective now                 EDUCATION NEEDS:   Not appropriate for education  at this time  Skin:  Skin Assessment: Skin Integrity Issues: Skin Integrity Issues:: Stage II Stage II: sacrum  Last BM:  6/5  Height:   Ht Readings from Last 1 Encounters:  12/23/20 5' 10" (1.778 m)    Weight:   Wt Readings from Last 1 Encounters:  01/06/21 61.5 kg   BMI:  Body mass index is 19.45 kg/m.  Estimated Nutritional Needs:   Kcal:  1800-2000  Protein:  85-100 grams  Fluid:  >/= 1.8 L/day  Jennifer Parker, MS, RD, LDN RD pager number/after hours weekend pager number on Amion.

## 2021-01-06 NOTE — Plan of Care (Signed)
  Problem: Sit to Stand Goal: LTG:  Patient will perform sit to stand in prep for activites of daily living with assistance level (OT) Description: LTG:  Patient will perform sit to stand in prep for activites of daily living with assistance level (OT) Flowsheets (Taken 01/06/2021 1016) LTG: PT will perform sit to stand in prep for activites of daily living with assistance level: Contact Guard/Touching assist Note: Goal downgraded 6/6 -ESD   Problem: RH Toilet Transfers Goal: LTG Patient will perform toilet transfers w/assist (OT) Description: LTG: Patient will perform toilet transfers with assist, with/without cues using equipment (OT) Flowsheets (Taken 01/06/2021 1016) LTG: Pt will perform toilet transfers with assistance level of: Contact Guard/Touching assist Note: Goal downgraded 6/6 -ESD   Problem: RH Tub/Shower Transfers Goal: LTG Patient will perform tub/shower transfers w/assist (OT) Description: LTG: Patient will perform tub/shower transfers with assist, with/without cues using equipment (OT) Note: Goal downgraded 6/6 -ESD

## 2021-01-06 NOTE — Progress Notes (Signed)
Patient ID: Jennifer [name], female   DOB: Dec 15, 1975, 45 y.o.   MRN: [id number]  SW faxed completed disability forms for pt to [employer]#[phone]. SW provided pt with original forms.   Loralee Pacas, MSW, [place] Office: [phone] Cell: [phone] Fax: [phone]

## 2021-01-06 NOTE — Progress Notes (Signed)
PROGRESS NOTE   Subjective/Complaints: Still feels slightly SOB. No different than it has been the last several days. Hasn't noticed a big difference with lasix 20mg  or 40mg  daily. Appetite still much better. Therapy reported that she's doing quite well with them  ROS: Patient denies fever, rash, sore throat, blurred vision, nausea, vomiting, diarrhea, cough,   chest pain, joint or back pain, headache, or mood change.    Objective:   No results found. Recent Labs    01/06/21 0718  WBC 8.4  HGB 9.4*  HCT 33.0*  PLT 266   Recent Labs    01/06/21 0718  NA 142  K 4.0  CL 97*  CO2 41*  GLUCOSE 105*  BUN 13  CREATININE 0.45  CALCIUM 9.0    Intake/Output Summary (Last 24 hours) at 01/06/2021 1036 Last data filed at 01/06/2021 0830 Gross per 24 hour  Intake 1416 ml  Output --  Net 1416 ml     Pressure Injury 12/23/20 Sacrum Mid;Medial Stage 2 -  Partial thickness loss of dermis presenting as a shallow open injury with a red, pink wound bed without slough. fissure like wound that is healing from a more round state (Active)  12/23/20 1626  Location: Sacrum  Location Orientation: Mid;Medial  Staging: Stage 2 -  Partial thickness loss of dermis presenting as a shallow open injury with a red, pink wound bed without slough.  Wound Description (Comments): fissure like wound that is healing from a more round state  Present on Admission: Yes    Physical Exam: Vital Signs Blood pressure 110/78, pulse (!) 106, temperature 98.2 F (36.8 C), temperature source Oral, resp. rate 18, height 5\' 10"  (1.778 m), weight 61.5 kg, SpO2 96 %. Constitutional: No distress . Vital signs reviewed. HEENT: EOMI, oral membranes moist Neck: supple Cardiovascular: Tachy with mitral click. No JVD    Respiratory/Chest: CTA Bilaterally without wheezes or rales. Normal effort    GI/Abdomen: BS +, non-tender, non-distended Ext: no clubbing, cyanosis,  tr LE edema Psych: pleasant and cooperative, quiet Skin: sternal wound well healed with glue. Sacral wound esstly resolved Neuro: Pt is cognitively appropriate with normal insight, memory, and awareness. Cranial nerves 2-12 are intact. Sensory exam is normal. Reflexes are 2+ in all 4's. Fine motor coordination is intact. No tremors. Motor function is grossly 4/5 UE and 4-/5 LE. Musculoskeletal: Full ROM, No pain with AROM or PROM in the neck, trunk, or extremities. Posture appropriate     Assessment/Plan: 1. Functional deficits which require 3+ hours per day of interdisciplinary therapy in a comprehensive inpatient rehab setting.  Physiatrist is providing close team supervision and 24 hour management of active medical problems listed below.  Physiatrist and rehab team continue to assess barriers to discharge/monitor patient progress toward functional and medical goals  Care Tool:  Bathing    Body parts bathed by patient: Right arm,Left arm,Chest,Abdomen,Front perineal area,Buttocks,Right upper leg,Left upper leg,Face,Right lower leg,Left lower leg   Body parts bathed by helper: Left lower leg,Right lower leg     Bathing assist Assist Level: Supervision/Verbal cueing     Upper Body Dressing/Undressing Upper body dressing   What is the patient wearing?: Pull over  shirt    Upper body assist Assist Level: Supervision/Verbal cueing    Lower Body Dressing/Undressing Lower body dressing      What is the patient wearing?: Underwear/pull up,Pants     Lower body assist Assist for lower body dressing: Contact Guard/Touching assist     Toileting Toileting    Toileting assist Assist for toileting: Supervision/Verbal cueing     Transfers Chair/bed transfer  Transfers assist     Chair/bed transfer assist level: Minimal Assistance - Patient > 75%     Locomotion Ambulation   Ambulation assist      Assist level: Contact Guard/Touching assist Assistive device:  Walker-rolling Max distance: 40   Walk 10 feet activity   Assist  Walk 10 feet activity did not occur: Safety/medical concerns  Assist level: Contact Guard/Touching assist Assistive device: Walker-rolling   Walk 50 feet activity   Assist Walk 50 feet with 2 turns activity did not occur: Safety/medical concerns  Assist level: Contact Guard/Touching assist Assistive device: Walker-rolling    Walk 150 feet activity   Assist Walk 150 feet activity did not occur: Safety/medical concerns  Assist level: Minimal Assistance - Patient > 75% Assistive device: Walker-rolling    Walk 10 feet on uneven surface  activity   Assist Walk 10 feet on uneven surfaces activity did not occur: Safety/medical concerns         Wheelchair     Assist Will patient use wheelchair at discharge?: No             Wheelchair 50 feet with 2 turns activity    Assist            Wheelchair 150 feet activity     Assist          Blood pressure 110/78, pulse (!) 106, temperature 98.2 F (36.8 C), temperature source Oral, resp. rate 18, height 5\' 10"  (1.778 m), weight 61.5 kg, SpO2 96 %.  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 Sternal precautions. -patient may shower -ELOS/Goals: 01/10/21, supervision goals  -Continue CIR therapies including PT, OT     2. Antithrombotics: -DVT/anticoagulation:Chronic Coumadin, INR therapeutic -continue dosing adjustments per pharmacy protocol  INR 2.4 on 6/5, monitor daily -antiplatelet therapy: Aspirin 81 mg daily 3. Pain Management: ContinueHydrocodone as needed 4. Mood:Zoloft 25 mg daily, Wellbutrin 150 mg daily, melatonin 6 mg nightly Xanax as needed -antipsychotic agents: N/A  -discussed some coping skills for anxiety. Encouraged her to use xanax as well  if anxiety is coming to a head.  -anxiety seems controlled on 6/6 5. Neuropsych: This patientiscapable of making decisions on herown behalf. 6. Skin/Wound Care:              -continue foam dressing to sacrum---resolving             -nutrition better 7. Fluids/Electrolytes/Severe protein deficient malutrition: encourage appropriate PO    -added protein supp for low albumin   -appreciate RD help   -decrease megace to once daily 8. Acute blood loss anemia. Follow-up hgb 9.8 today. Continue iron supplement  -held iron supplement d/t nausea 9. Atrial fibrillation. Cardizem 30 mg every 6 hours, metoprolol 25 mg 3 times daily, losartan 25 mg daily, amiodarone 200 mg daily -losartan resumed, still on lasix 40mg  daily  6/6: HR ranges from mid 100's to 120. Still feels more SOB than she did prior the beginning of last week although sx are rather mild. There may  be an anxiety component.  -Her outpatient cardiologist is Dr. Marko Plume at [facility] who specializes in Marfan Syndrome.   -will ask cardiology to see patient to make sure we have meds optimized and that her volume status is appropriate.  -continue lasix 40mg  daily Vitals:   01/06/21 0004 01/06/21 0406  BP: 108/67 110/78  Pulse: (!) 113 (!) 106  Resp: 18 18  Temp: 98.2 F (36.8 C) 98.2 F (36.8 C)  SpO2: 96% 96%   10. Mild constipation   -  senna-s at bedtime---moving bowels   -nausea resolved 11.  Mild hypoxia improved on O2, still sl sob at rest and with activity  -lasix  40mg  daily for now  -recent CXR stable  6/6: weight sl improved with lasix. May need more diuresis. Some of this healthy weight from improved PO intake however.   --consult cardiology re: volume status (see above) Filed Weights   01/04/21 0625 01/05/21 0500 01/06/21 0500  Weight: 60.5 kg 60.7 kg 61.5 kg     LOS: 14 days A FACE TO FACE EVALUATION WAS PERFORMED  Meredith Staggers 01/06/2021, 10:36 AM

## 2021-01-07 LAB — PROTIME-INR
INR: 2.8 — ABNORMAL HIGH (ref 0.8–1.2)
Prothrombin Time: 29.6 seconds — ABNORMAL HIGH (ref 11.4–15.2)

## 2021-01-07 MED ORDER — FUROSEMIDE 40 MG PO TABS
40.0000 mg | ORAL_TABLET | Freq: Once | ORAL | Status: AC
  Administered 2021-01-07: 40 mg via ORAL
  Filled 2021-01-07: qty 1

## 2021-01-07 MED ORDER — WARFARIN SODIUM 5 MG PO TABS
5.0000 mg | ORAL_TABLET | Freq: Once | ORAL | Status: AC
  Administered 2021-01-07: 5 mg via ORAL
  Filled 2021-01-07 (×2): qty 1

## 2021-01-07 NOTE — Patient Care Conference (Signed)
Inpatient RehabilitationTeam Conference and Plan of Care Update Date: 01/07/2021   Time: 10:45 AM    Patient Name: Jennifer [name]      Medical Record Number: [id number]  Date of Birth: [birth year] Sex: Female         Room/Bed: 4W09C/4W09C-01 Payor Info: Payor: [employer] / Plan: [place] [employer] / Product Type: *No Product type* /    Admit Date/Time:  12/23/2020  4:13 PM  Primary Diagnosis:  [facility] Problems: Principal Problem:   Debility Active Problems:   Pressure injury of skin   Protein-calorie malnutrition, severe   Marfan syndrome    Expected Discharge Date: Expected Discharge Date: 01/10/21  Team Members Present: Physician leading conference: Dr. Alger Simons Care Coodinator Present: Loralee Pacas, LCSWA;Kaileia Flow Creig Hines, RN, BSN, [place] Nurse Present: Dorthula Nettles, RN PT Present: Apolinar Junes, PT OT Present: Cherylynn Ridges, OT PPS Coordinator present : Ileana Ladd, PT     Current Status/Progress Goal Weekly Team Focus  Bowel/Bladder   Patient is continent of bladder/bowel, LBM  01/06/2021  remain continent  Shift assessment QS/PRN   Swallow/Nutrition/ Hydration             ADL's   Min A to stand, CGA BADL tasks  supervision/CGA  pt/family education, dc planning, sit<>stand, endurance   Mobility   supervision bed mobility with features, modA sit to stand, CGA gait ~220' with RW  Supervision  sit to stand trasnfers, bed mobility without bed features, stair training, DC prep   Communication             Safety/Cognition/ Behavioral Observations            Pain   No complaint pain  < 3/10  Assess QS and PRN   Skin   Surgical mid chest incsion intact glue closure noted steri strips intact no redness, drainage or irritation  Surgical chest incision continue healing of wound with no signs of infection  QS/PRN assessment     Discharge Planning:  D/c to home with 24/7 care from various family members (husband and sons-17/19/24); pt  husband goes to work at [employer] edu completed on 6/6 1pm-3pm with husband and sons.   Team Discussion: Bumped up Lasix dosage, still SOB. Cardiology to come see her today. Appetite has improved. Continent B/B, restless at times, still experiencing anxiety. Not sure about home health or outpatient therapy due to transportation.  Patient on target to meet rehab goals: yes, family education yesterday. Sit to stand min assist, endurance improving, still de-sats occasionally. Supervision bed mobility, mod assist sit to stand, contact guard > 220 ft with RW. Supervision level goals.  *See Care Plan and progress notes for long and short-term goals.   Revisions to Treatment Plan:  Weaning Megace, due to appetite improving.  Teaching Needs: Family education, medication management, anxiety management, transfer training, gait training, balance training, endurance training, safety awareness.  Current Barriers to Discharge: Decreased caregiver support, Medical stability, Home enviroment access/layout, Lack of/limited family support, Medication compliance, Behavior and Nutritional means  Possible Resolutions to Barriers: Continue current medications, proved emotional support.     Medical Summary Current Status: some SOB but a little better with diuretic. cardiology consulted to review meds. anxiety at times. eating much better--weaning megace  Barriers to Discharge: Medical stability   Possible Resolutions to [employer] Focus: cv assessment of labs, data. med mgt   Continued Need for Acute Rehabilitation Level of Care: The patient requires daily medical management by a physician with  specialized training in physical medicine and rehabilitation for the following reasons: Direction of a multidisciplinary physical rehabilitation program to maximize functional independence : Yes Medical management of patient stability for increased activity during participation in an intensive rehabilitation regime.:  Yes Analysis of laboratory values and/or radiology reports with any subsequent need for medication adjustment and/or medical intervention. : Yes   I attest that I was present, lead the team conference, and concur with the assessment and plan of the team.   Cristi Loron 01/07/2021, 3:58 PM

## 2021-01-07 NOTE — Progress Notes (Signed)
Occupational Therapy Session Note  Patient Details  Name: Jennifer [name] MRN: [id number] Date of Birth: 01/08/1976  Today's Date: 01/07/2021  Session 1 OT Individual Time: 1100-1200 OT Individual Time Calculation (min): 60 min   Session 2 OT Individual Time: 1355-1439 OT Individual Time Calculation (min): 44 min   Short Term Goals: Week 2:  OT Short Term Goal 1 (Week 2): LTG=STG 2.2 ELOS  Skilled Therapeutic Interventions/Progress Updates:  Session 1   Pt greeted seated in wc and agreeable to OT treatment session. Pt declined any BADL tasks this morning. PT brought down to therapy apartment in wc. Practiced ambulating in kitchen and collecting items from upper cabinets, drawers, fridge, freezer, and microwave at RW level. Practiced managing long O2 cord as pt may dc home on O2.  Educated on safe iADL practices, simple meal prep, and energy conservation techniques. Standing balance/endurance with standing BITS activity using alternating UEs and alternating attention with central fixation of letter. Pt tolerated 2, 3 minute standing bouts. Pt completed 5 mins on level 4 on NuStep for strength and endurance. Pt maintained an 2L of O2 throughout session. Pt ambulated from nurses station back to room w/ CGA to stand, and CGA to ambulate back to room with RW. Pt left seated EOB with bed alarm on, call bell in reach, and needs met.   Session 2 Pt greeted sitting in wc and agreeable to OT treatment session. Pt stood with RW and CGA, then ambulated to the dayroom. Pt maintained on 2L throughout activity. Pt completed circuit of seated hip extension w/ toe taps on medium cones, hip abduction using orange theraband, and hip adduct8ion, squeezing ball-3 sets of 10. 4 sets of 5 continuous mini squat/partial stands-min A to achieve standing, mod A with fatigue. Extended rest breaks in between sets. UB there-ex and core strength with seated ball toss and oblique twist. Progressed to chest pass and overhead pass  for UB strength/endurance. Pt ambulated back to room to the bathroom with close supervision. PT voided bladder and needed CGA to stand from raised commode. Pt left seated in wc at end of session with call bell in reach and needs met.   Therapy Documentation Precautions:  Precautions Precautions: Sternal,Fall Restrictions Weight Bearing Restrictions: No Pain:  denies pain  Therapy/Group: Individual Therapy  Valma Cava 01/07/2021, 2:56 PM

## 2021-01-07 NOTE — Progress Notes (Signed)
ANTICOAGULATION CONSULT NOTE - Follow Up Consult  Pharmacy Consult for Coumadin Indication: St Jude MVR  No Known Allergies  Patient Measurements: Height: 5\' 10"  (177.8 cm) Weight: 60.2 kg (132 lb 11.5 oz) IBW/kg (Calculated) : 68.5  Vital Signs: Temp: 98.2 F (36.8 C) (06/07 0816) Temp Source: Oral (06/07 0816) BP: 113/76 (06/07 0816) Pulse Rate: 118 (06/07 0816)  Labs: Recent Labs    01/05/21 0604 01/06/21 0718 01/07/21 0611  HGB  --  9.4*  --   HCT  --  33.0*  --   PLT  --  266  --   LABPROT 25.7* 29.3* 29.6*  INR 2.4* 2.8* 2.8*  CREATININE  --  0.45  --     Estimated Creatinine Clearance: 86.2 mL/min (by C-G formula based on SCr of 0.45 mg/dL).   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 Goal INR 2.5-3.5.  CBC qMon: Hgb 9.4 relatively stable. Plts stable 260s  - INR 2.8  Goal of Therapy:  INR 2.5-3.5 Monitor platelets by anticoagulation protocol: Yes   Plan:  Coumadin 5mg  PO x1 Daily INR, qMon CBC   Larhonda Dettloff S. Alford Highland, PharmD, BCPS Clinical Staff Pharmacist [email or web address] Alford Highland, [employer] 01/07/2021,9:40 AM

## 2021-01-07 NOTE — Progress Notes (Signed)
PROGRESS NOTE   Subjective/Complaints: Still has orthopnea. Getting through therapies but requiring oxygen. Otherwise doing well  ROS: Patient denies fever, rash, sore throat, blurred vision, nausea, vomiting, diarrhea, cough,  chest pain, joint or back pain, headache, or mood change.    Objective:   No results found. Recent Labs    01/06/21 0718  WBC 8.4  HGB 9.4*  HCT 33.0*  PLT 266   Recent Labs    01/06/21 0718  NA 142  K 4.0  CL 97*  CO2 41*  GLUCOSE 105*  BUN 13  CREATININE 0.45  CALCIUM 9.0    Intake/Output Summary (Last 24 hours) at 01/07/2021 1239 Last data filed at 01/07/2021 0000 Gross per 24 hour  Intake 980 ml  Output --  Net 980 ml     Pressure Injury 12/23/20 Sacrum Mid;Medial Stage 2 -  Partial thickness loss of dermis presenting as a shallow open injury with a red, pink wound bed without slough. fissure like wound that is healing from a more round state (Active)  12/23/20 1626  Location: Sacrum  Location Orientation: Mid;Medial  Staging: Stage 2 -  Partial thickness loss of dermis presenting as a shallow open injury with a red, pink wound bed without slough.  Wound Description (Comments): fissure like wound that is healing from a more round state  Present on Admission: Yes    Physical Exam: Vital Signs Blood pressure 95/74, pulse (!) 120, temperature 98.4 F (36.9 C), temperature source Oral, resp. rate 17, height 5\' 10"  (1.778 m), weight 60.2 kg, SpO2 98 %. Constitutional: No distress . Vital signs reviewed. HEENT: EOMI, oral membranes moist Neck: supple Cardiovascular: RRR with mitral click    Respiratory/Chest: CTA Bilaterally without wheezes or rales. Normal effort    GI/Abdomen: BS +, non-tender, non-distended Ext: no clubbing, cyanosis, tr LE edema Psych: pleasant and cooperative Skin: sternal wound well healed with glue. Sacral wound almost gone Neuro: Pt is cognitively  appropriate with normal insight, memory, and awareness. Cranial nerves 2-12 are intact. Sensory exam is normal. Reflexes are 2+ in all 4's. Fine motor coordination is intact. No tremors. Motor function is grossly 4/5 UE and 4-/5 LE. Musculoskeletal: Full ROM, No pain with AROM or PROM in the neck, trunk, or extremities. Posture appropriate     Assessment/Plan: 1. Functional deficits which require 3+ hours per day of interdisciplinary therapy in a comprehensive inpatient rehab setting.  Physiatrist is providing close team supervision and 24 hour management of active medical problems listed below.  Physiatrist and rehab team continue to assess barriers to discharge/monitor patient progress toward functional and medical goals  Care Tool:  Bathing    Body parts bathed by patient: Right arm,Left arm,Chest,Abdomen,Front perineal area,Buttocks,Right upper leg,Left upper leg,Face,Right lower leg,Left lower leg   Body parts bathed by helper: Left lower leg,Right lower leg     Bathing assist Assist Level: Supervision/Verbal cueing     Upper Body Dressing/Undressing Upper body dressing   What is the patient wearing?: Pull over shirt    Upper body assist Assist Level: Supervision/Verbal cueing    Lower Body Dressing/Undressing Lower body dressing      What is the patient wearing?:  Underwear/pull up,Pants     Lower body assist Assist for lower body dressing: Contact Guard/Touching assist     Toileting Toileting    Toileting assist Assist for toileting: Supervision/Verbal cueing     Transfers Chair/bed transfer  Transfers assist     Chair/bed transfer assist level: Minimal Assistance - Patient > 75%     Locomotion Ambulation   Ambulation assist      Assist level: Contact Guard/Touching assist Assistive device: Walker-rolling Max distance: 150   Walk 10 feet activity   Assist  Walk 10 feet activity did not occur: Safety/medical concerns  Assist level: Contact  Guard/Touching assist Assistive device: Walker-rolling   Walk 50 feet activity   Assist Walk 50 feet with 2 turns activity did not occur: Safety/medical concerns  Assist level: Contact Guard/Touching assist Assistive device: Walker-rolling    Walk 150 feet activity   Assist Walk 150 feet activity did not occur: Safety/medical concerns  Assist level: Contact Guard/Touching assist Assistive device: Walker-rolling    Walk 10 feet on uneven surface  activity   Assist Walk 10 feet on uneven surfaces activity did not occur: Safety/medical concerns         Wheelchair     Assist Will patient use wheelchair at discharge?: No             Wheelchair 50 feet with 2 turns activity    Assist            Wheelchair 150 feet activity     Assist          Blood pressure 95/74, pulse (!) 120, temperature 98.4 F (36.9 C), temperature source Oral, resp. rate 17, height 5\' 10"  (1.778 m), weight 60.2 kg, SpO2 98 %.  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 Sternal precautions. -patient may shower -ELOS/Goals: 01/10/21, supervision goals  -Continue CIR therapies including PT, OT      2. Antithrombotics: -DVT/anticoagulation:Chronic Coumadin  -continue dosing adjustments per pharmacy protocol  -antiplatelet therapy: Aspirin 81 mg daily 3. Pain Management: ContinueHydrocodone as needed 4. Mood:Zoloft 25 mg daily, Wellbutrin 150 mg daily, melatonin 6 mg nightly Xanax as needed -antipsychotic agents: N/A  -discussed some coping skills for anxiety. Encouraged her to use xanax as well if anxiety is coming to a head.  -anxiety seems controlled on 6/6 5. Neuropsych: This patientiscapable of making decisions on herown behalf. 6. Skin/Wound Care:              -continue foam dressing to  sacrum---resolving             -nutrition better 7. Fluids/Electrolytes/Severe protein deficient malutrition: encourage appropriate PO    -added protein supp for low albumin   -appreciate RD help   -decrease megace to once daily 8. Acute blood loss anemia. Follow-up hgb 9.8 today. Continue iron supplement  -held iron supplement d/t nausea 9. Atrial fibrillation. Cardizem 30 mg every 6 hours, metoprolol 25 mg 3 times daily, losartan 25 mg daily, amiodarone 200 mg daily -losartan resumed, still on lasix 40mg  daily  6/7: HR still ranges from mid 100's to 120. Still feels more SOB than she did prior the beginning of last week although sx are rather mild. There may be an anxiety component.  -Her outpatient cardiologist is Dr. Marko Plume at [facility] who specializes in Marfan Syndrome.   -have asked cardiology to see patient to make sure we have meds optimized and that her volume status  is appropriate.   Vitals:   01/07/21 0816 01/07/21 1215  BP: 113/76 95/74  Pulse: (!) 118 (!) 120  Resp: 17 17  Temp: 98.2 F (36.8 C) 98.4 F (36.9 C)  SpO2: 94% 98%   10. Mild constipation   -  senna-s at bedtime---moving bowels   -nausea resolved 11.  Mild hypoxia improved on O2, still sl sob at rest and with activity  -lasix  40mg  daily   -recent CXR stable  6/7: weight sl improved with lasix. May need more diuresis. Some of this healthy weight from improved PO intake however.   --consulted cardiology re HR/ volume status (see above)   -continue daily lasix. Give addnl 40mg  lasix today   -might need to go home on supplemental oxygen   -asked pt to make appt with outpt cardiology w/i 1-2 wks of discharge Filed Weights   01/05/21 0500 01/06/21 0500 01/07/21 0630  Weight: 60.7 kg 61.5 kg 60.2 kg     LOS: 15 days A FACE TO FACE EVALUATION WAS PERFORMED  Meredith Staggers 01/07/2021, 12:39 PM

## 2021-01-07 NOTE — Progress Notes (Signed)
Physical Therapy Session Note  Patient Details  Name: Jennifer [name] MRN: [id number] Date of Birth: [birth year]  Today's Date: 01/07/2021 PT Individual Time: 2778-2423 and 5361-4431 PT Individual Time Calculation (min): 25 min and 42 min  Short Term Goals: Week 2:  PT Short Term Goal 1 (Week 2): Pt will complete bed mobility with supervision and no bed features. PT Short Term Goal 2 (Week 2): Pt will complete sit to stand consistently with minA. PT Short Term Goal 3 (Week 2): Pt will ambulates 150' with CGA and LRAD. PT Short Term Goal 4 (Week 2): Pt will complete x4 steps with BHRs and minA.  Skilled Therapeutic Interventions/Progress Updates:     Pt received supine in bed and agrees to therapy. No complaint of pain. Supine to sit with bed features. Pt attempts sit to stand from EOB without assistance and is unable to complete. PT then provides minA and pt performs stand step transfer to [facility]. Transport to gym for time management. Pt transfers to mat table with minA. Pt practices bed mobility on flat surface and is able to perform rolling to R and rolling to L to sit up with supervision. Pt then performs sit to stand from elevated mat and then from gradually decreasing heights. Hard backed chair placed back in front of pt to encourage pt to adequately shift weight forward for optimal body mechanics and ease of transfer. Pt able to perform sit to stand from multiple heights with supervision, down to lowest setting on mat table ~22". Pt then transfers to chair and attempts to stand from chair (~21"), but is unable to without physical assistance. PT manually facilitates anterior weight shift and pt performs block practices of shifting weights forward to clear buttocks from chair. Stand step transfer back to [facility] with minA. Left seated in WC with alarm intact and all needs within reach.  2nd Session: Pt received seated in [facility] and agrees to therapy. No complaint of pain. WC to gym for time management. Pt  performs strengthening and stair training in parallel bars. Initially pt performs sit to stand in parallel bars with minA. Pt performs 2x10 mini squats with multimodal cueing for body mechanics and optimal performance. Following seated rest break, pt performs 3x5 steps ups on 6 inch step, leading with stronger L lower extremity. Pt perform 2x4 with leading with R lower extremity. PT cues to gradually decrease WB through arms to increase load and strengthening through legs.   Pt performs high kneeling activity on mat table for core and hip strengthening. MinA to attain high kneeling position with blue platform. Pt performs alternating upper extremity reaching x10 then bilateral arm raises to challenge strength and balance. Pt transitions to quadruped and performs 1x5 bilateral leg extensions with minA at hips for stability.   Pt performs sit to stand, holding onto back of WC for balance support. Pt performs 2x10 slow marches with high knees, then 1x10 standing hip abduction. Performed for strength and balance training.  Pt left seated in WC with alarm intact and all needs within reach.  Therapy Documentation Precautions:  Precautions Precautions: Sternal,Fall Restrictions Weight Bearing Restrictions: No   Therapy/Group: Individual Therapy  Breck Coons, PT, DPT 01/07/2021, 4:17 PM

## 2021-01-07 NOTE — Progress Notes (Signed)
Patient ID: Jennifer [name], female   DOB: 12-15-1975, 44 y.o.   MRN: [id number]  SW met with pt in room to provide updates from team conference, and discuss recommendations: OPT or HH, and DME- RW, w/c, 3in1 BSC, and TTB. Pt prefers outpatient at [facility] ([facility]), and pt states she has a RW. SW will order other items.   SW ordered TTB, 3in1 BSC,and w/c with [facility] via parachute.   Loralee Pacas, MSW, [place] Office: [phone] Cell: [phone] Fax: [phone]

## 2021-01-08 LAB — PROTIME-INR
INR: 3.1 — ABNORMAL HIGH (ref 0.8–1.2)
Prothrombin Time: 31.5 seconds — ABNORMAL HIGH (ref 11.4–15.2)

## 2021-01-08 MED ORDER — FUROSEMIDE 40 MG PO TABS
40.0000 mg | ORAL_TABLET | Freq: Once | ORAL | Status: AC
  Administered 2021-01-08: 40 mg via ORAL
  Filled 2021-01-08: qty 1

## 2021-01-08 MED ORDER — WARFARIN SODIUM 5 MG PO TABS
5.0000 mg | ORAL_TABLET | Freq: Once | ORAL | Status: AC
  Administered 2021-01-08: 5 mg via ORAL
  Filled 2021-01-08: qty 1

## 2021-01-08 NOTE — Discharge Summary (Signed)
Physician Discharge Summary  Patient ID: NAVAH GRONDIN MRN: [id number] DOB/AGE: [birth year] 45 y.o.  Admit date: 12/23/2020 Discharge date: 01/10/2021  Discharge Diagnoses:  Principal Problem:   Debility Active Problems:   Pressure injury of skin   Protein-calorie malnutrition, severe   Marfan syndrome DVT prophylaxis Pain management Mood stabilization Acute blood loss anemia Atrial fibrillation Mild constipation Mild hypoxia  Discharged Condition: Stable  Significant Diagnostic Studies: DG Chest 2 View  Result Date: 01/03/2021 CLINICAL DATA:  Increased shortness of breath last 2 days, prior open heart surgery EXAM: CHEST - 2 VIEW COMPARISON:  12/30/2020 FINDINGS: Prior median sternotomy and valve replacement. Enlargement of cardiac silhouette. Mediastinal contours and pulmonary vascularity normal. Small bibasilar pleural effusions and atelectasis greater on LEFT. Remaining lungs clear. Underlying emphysematous changes suspected. No pneumothorax. BILATERAL nipple shadows identified. Levoconvex thoracic scoliosis with evidence of prior anterior chest wall reconstruction IMPRESSION: Bibasilar pleural effusions and atelectasis greater on LEFT, little changed. Underlying emphysematous changes suspected. Enlargement of cardiac silhouette. Electronically Signed   By: Lavonia Dana M.D.   On: 01/03/2021 14:02   DG Chest 2 View  Result Date: 12/30/2020 CLINICAL DATA:  Orthopnea EXAM: CHEST - 2 VIEW COMPARISON:  Dec 18, 2020 FINDINGS: The cardiomediastinal silhouette is unchanged and enlarged in contour.Status post median sternotomy and valve replacement. Small bilateral pleural effusions. No pneumothorax. LEFT retrocardiac opacity, similar in comparison to prior and likely atelectasis. Mildly increased reticular opacities and interstitial prominence at the lung bases. Visualized abdomen is unremarkable. Levocurvature of the thoracic spine superiorly. IMPRESSION: Constellation of findings are  favored to reflect mildly increased pulmonary edema. There are small bilateral pleural effusions. Electronically Signed   By: Valentino Saxon MD   On: 12/30/2020 17:45   DG Chest Port 1 View  Result Date: 12/18/2020 CLINICAL DATA:  Chest tube removal EXAM: PORTABLE CHEST 1 VIEW COMPARISON:  12/17/2020 FINDINGS: Lungs are hyperexpanded. Leftward patient rotation noted. Right chest tube has been removed in the interval. No evidence for right-sided pneumothorax. Right pleural thickening/fluid is similar to prior with stable atelectasis at the right base. Left base retrocardiac atelectasis or infiltrate in tiny left pleural effusion again noted. Cardiopericardial silhouette is at upper limits of normal for size. IMPRESSION: 1. Interval removal of right chest tube without evidence for right-sided pneumothorax. 2. Otherwise no substantial change. Electronically Signed   By: Misty Stanley M.D.   On: 12/18/2020 14:29   DG Chest Port 1 View  Result Date: 12/17/2020 CLINICAL DATA:  Pleural effusion EXAM: PORTABLE CHEST 1 VIEW COMPARISON:  12/16/2020 FINDINGS: Postoperative changes. Cardiomegaly. Small bilateral pleural effusions noted. Right chest tube remains in place without pneumothorax. Vascular congestion and bibasilar atelectasis, similar prior study. IMPRESSION: Small bilateral pleural effusions and bibasilar atelectasis. No pneumothorax. No significant change. Electronically Signed   By: Rolm Baptise M.D.   On: 12/17/2020 08:46   DG CHEST PORT 1 VIEW  Result Date: 12/16/2020 CLINICAL DATA:  Atelectasis EXAM: PORTABLE CHEST 1 VIEW COMPARISON:  12/15/2020 FINDINGS: Large bore right chest tube is unchanged. No pneumothorax. Small bilateral pleural effusions persist. Left basilar consolidation is unchanged. Airspace infiltrate at the right lung base is unchanged. Median sternotomy has been performed. Cardiac size is mildly enlarged. Mitral valve replacement has been performed. Pulmonary vascularity is  normal. IMPRESSION: Right chest tube in place.  No pneumothorax. Stable left basilar consolidation and right basilar airspace infiltrate. Stable small associated bilateral pleural effusions. Unchanged cardiomegaly. Electronically Signed   By: Fidela Salisbury MD   On: 12/16/2020  06:33   DG CHEST PORT 1 VIEW  Result Date: 12/15/2020 CLINICAL DATA:  Shortness of breath. EXAM: PORTABLE CHEST 1 VIEW COMPARISON:  12/14/2020 FINDINGS: Right chest tube remains in place with tip over the right apex. There is right pleural fluid without visible pneumothorax. The right PICC line is been removed. Bilateral lower lung zone interstitial and airspace opacities are unchanged. Stable cardiac enlargement. Previous median sternotomy and valve repair. IMPRESSION: 1. No change in aeration to the lungs compared with prior exam. 2. Stable right chest tube. No pneumothorax. Electronically Signed   By: Kerby Moors M.D.   On: 12/15/2020 07:12   DG Chest Port 1 View  Result Date: 12/14/2020 CLINICAL DATA:  Shortness of breath.  Chest tube EXAM: PORTABLE CHEST 1 VIEW COMPARISON:  Yesterday FINDINGS: The left more than right lower pulmonary opacification with small right pleural effusion. There was ultrasound pleural fluid evaluation yesterday. Right chest tube with tip at the apex. No pneumothorax. Right PICC with tip at the SVC. Postoperative chest wall and heart. Cardiomegaly. IMPRESSION: Unchanged since 12/11/2020, as above. Electronically Signed   By: Monte Fantasia M.D.   On: 12/14/2020 07:10   DG Chest Port 1 View  Result Date: 12/13/2020 CLINICAL DATA:  Pneumothorax and chest tube EXAM: PORTABLE CHEST 1 VIEW COMPARISON:  Yesterday FINDINGS: Right chest tube with tip at the apex. Right pleural fluid reaching the apex. Right PICC with tip at the SVC. Stable pulmonary opacity and left base pleural effusion. Cardiomegaly. Prior valve replacement. IMPRESSION: Stable hardware, pleural fluid, and pulmonary opacity.  Electronically Signed   By: Monte Fantasia M.D.   On: 12/13/2020 06:02   DG CHEST PORT 1 VIEW  Result Date: 12/12/2020 CLINICAL DATA:  Pleural effusion with chest tube in place EXAM: PORTABLE CHEST 1 VIEW COMPARISON:  Yesterday FINDINGS: Right chest tube with tip at the apex. There is right pleural fluid without visible pneumothorax. Right PICC with tip at the SVC. Unchanged dense opacity at the bases. Cardiac enlargement. Prior valve repair and median sternotomy. IMPRESSION: 1. No visible pneumothorax. 2. Stable lower chest opacification and right pleural effusion. Electronically Signed   By: Monte Fantasia M.D.   On: 12/12/2020 06:31   DG Chest Port 1 View  Result Date: 12/11/2020 CLINICAL DATA:  Chest tube EXAM: PORTABLE CHEST 1 VIEW COMPARISON:  Five days ago FINDINGS: Right PICC with tip at the right atrium. Right-sided chest tube with tip at the apex. Cardiomegaly.  Mitral valve repair. Pleural fluid has replaced pneumothorax on the right. There is also pleural fluid on the left with opacified retrocardiac lung. Dense opacity at the right base. IMPRESSION: 1. Continued bilateral lower lobe opacification. 2. Pleural fluid has replaced right-sided pneumothorax. Electronically Signed   By: Monte Fantasia M.D.   On: 12/11/2020 06:05   IR US CHEST  Result Date: 12/13/2020 CLINICAL DATA:  History of extensive postoperative change of the chest including right-sided chest tube (all of which was performed at an outside institution), now with concern for bilateral pleural effusions. Please perform chest ultrasound and ultrasound-guided thoracentesis as indicated. EXAM: CHEST ULTRASOUND COMPARISON:  Multiple previous chest radiographs, most recently earlier today FINDINGS: Sonographic evaluation of the left chest is negative for any significant pleural fluid. Sonographic evaluation of the right chest demonstrates a trace amount of right-sided pleural effusion however patient has a right-sided chest tube  and at this point does not wish to undergo ultrasound-guided thoracentesis. IMPRESSION: 1. No left-sided pleural effusion. Questioned left sided pleural effusion on  preceding chest radiograph is thus favored to represent atelectasis and/or infiltrate. 2. Trace right-sided pleural effusion with right-sided chest tube in place. The patient does NOT wish to undergo ultrasound-guided thoracentesis at this time and additionally it is likely very challenging to perform a thoracentesis given trace amount of fluid as well as patient's decreased mobility given extensive postoperative change Electronically Signed   By: Sandi Mariscal M.D.   On: 12/13/2020 16:22     Labs:  Basic Metabolic Panel: Recent Labs  Lab 01/03/21 0622 01/06/21 0718  NA 141 142  K 5.0 4.0  CL 100 97*  CO2 38* 41*  GLUCOSE 98 105*  BUN 14 13  CREATININE 0.38* 0.45  CALCIUM 8.9 9.0    CBC: Recent Labs  Lab 01/03/21 0622 01/06/21 0718  WBC 8.4 8.4  HGB 9.1* 9.4*  HCT 32.1* 33.0*  MCV 101.9* 100.0  PLT 267 266    CBG: No results for input(s): GLUCAP in the last 168 hours.  Family history.  Father with Marfan syndrome as well as hepatitis C.  Denies any colon cancer esophageal cancer or rectal cancer  Brief HPI:   Elodia L Mertz is a 45 y.o. right-handed female with history of congenital pectus excavatum, Marfan syndrome followed by cardiology services Dr.Lisi at [facility], mitral valve prolapse and known mild to moderate mitral regurgitation.  Per chart review she lives with her spouse in [place].  1 level home 12 steps to entry.  Husband works during the day.  Good family support.  She does have 3 adult aged children at home.  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  On arrival to the ED she was tachycardic 110 blood  pressure 110s over 90s, SPO2 90% on room air.  Hemoglobin 9.3.  EKG demonstrated sinus tachycardia right bundle branch block.  CTA chest no pulmonary emboli however findings compatible with fluid overload including cardiomegaly and small pericardial effusion small bilateral pleural effusion pulmonary interstitial edema.  CT abdomen pelvis with contrast demonstrated small volume ascites.  Patient did undergo diuresis.  Placed on Lovenox for DVT prophylaxis.  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  Coumadin therapy was initiated.  Sternal precautions as directed.  She did require a right chest tube for a short time removed 12/18/2020 as well as undergoing a thoracentesis for pleural effusion.  She was weaned from oxygen therapy.  Hospital course complicated by bouts of atrial fibrillation maintained on amiodarone advised to continue Coumadin with latest INR 2.6.  Hospital course anemia 8.6 and monitored.  [facility] for ongoing therapies.  Therapy evaluations completed due to patient decreased functional mobility was admitted for a comprehensive rehab program.   Hospital Course: Virlee L Fitzwater was admitted to rehab 12/23/2020 for inpatient therapies to consist of PT, ST and OT at least three hours five days a week. Past admission physiatrist, therapy team and rehab RN have worked together to provide customized collaborative inpatient rehab.  Pertaining to patient's debility secondary to endocarditis/valve prolapse status post MVR as well as repair of congenital pectus excavatum.  Sternal precautions as indicated.  She would follow-up with Dr. Clementeen Graham as  well as Dr.Lisi cardiology services at [facility].  She had been maintained on chronic Coumadin low-dose aspirin no bleeding episodes.  Pain  managed with use of hydrocodone as needed.  Mood stabilization with Wellbutrin as well as melatonin to help aid with sleep.  Acute blood loss anemia stable iron supplement latest hemoglobin 9.8.  Atrial fibrillation Cardizem metoprolol ongoing as well as losartan with amiodarone.  She continued on low-dose Lasix monitoring for any signs of fluid overload.  Close monitoring of heart rate she did receive cardiology follow-up while at [facility] by Dr. Doylene Canard who recommended continue follow-up outpatient with her primary cardiologist Dr.LISI who specializes in Marfan syndrome.  Mild constipation resolved with laxative assistance.  Mild hypoxia improved on oxygen still some shortness of breath with activity recent chest x-ray stable she was evaluated for possible home supplementation oxygen.   Blood pressures were monitored on TID basis and soft and monitored     Rehab course: During patient's stay in rehab weekly team conferences were held to monitor patient's progress, set goals and discuss barriers to discharge. At admission, patient required min mod assist for mobility min mod assist ADLs  Physical exam.  Blood pressure 98/69 pulse 114 temperature 97.9 respirations 18 oxygen saturations 99% room air Constitutional.  No acute distress HEENT Head.  Normocephalic and atraumatic Eyes.  Pupils round and reactive to light no discharge nystagmus Neck.  Supple nontender no JVD without thyromegaly Cardiac regular rate/tachycardic Abdomen.  Soft nontender positive bowel sounds without rebound Respiratory effort normal no respiratory distress without wheeze Skin.  Warm and dry.  Chest incision well-healed with Steri-Strips Neurologic.  Alert oriented normal insight and awareness.  Intact memory.  Normal language and speech.  Cranial nerves unremarkable, upper extremity motor 4 -/5 proximal to 4/5 distal.  Lower extremities 3/5 hip flexors knee extension and 4/5 ankle dorsi plantarflexion.  No  sensory findings.  Normal tone  He/She  has had improvement in activity tolerance, balance, postural control as well as ability to compensate for deficits. He/She has had improvement in functional use RUE/LUE  and RLE/LLE as well as improvement in awareness.  Patient able to sit to stand from edge of bed without assistance.  Minimal assist perform stand step transfer to wheelchair.  Transfers to mat table with minimal assist.  Patient practiced bed mobility on flat surface able to perform rolling to right and rolling to left with supervision.  Patient able to form sit to stand from multiple heights with supervision.  Transfers to chair to attempt to stand from chair with supervision.  Patient performs 2 x 10 minutes squats and multimodal cueing for body mechanics.  Practice ambulating in kitchen and collecting items from upper cabinets drawers refrigerator freezer microwave rolling walker level.  Practice managing long oxygen cord as needed with endurance.  Patient ambulated from nurses station back to her room contact-guard assist to stand.  Full family teaching completed plan discharged to home       Disposition: Discharge disposition: 01-Home or Self Care     Discharge to home   Diet: Regular  Special Instructions: No driving smoking or alcohol  Sternal precautions   Home health nurse to check INR on 01/14/2021 results to [place] fax number [phone] with INR range 2.5-3.5  Oxygen therapy 2 L as needed with exertion  01/11/2021 prothrombin time 35.5 INR 3.6   Medications at discharge. 1.  Tylenol as needed 2.  Xanax 0.5 mg p.o. twice daily as needed 3.  Amiodarone 200 mg p.o. daily 4.  Vitamin C 500 mg p.o. daily 5.  Aspirin 81 mg p.o. daily 6.  Wellbutrin 150 mg p.o. daily 7.  Cardizem 30 mg p.o. every 6 hours 8.  Lasix 40 mg p.o. daily 9.  Hydrocodone 1 tablet every 6 hours as needed moderate pain 10.  Cozaar 25 mg p.o. daily 11.  Melatonin 5 mg p.o.  nightly 12.  Lopressor 25 mg p.o. 3 times daily 13.  Multivitamin daily 14.  Protonix 40 mg p.o. daily 15.  Zoloft 25 mg p.o. daily 16.  Coumadin latest dose 2.5 mg adjusted accordingly for INR range 2.5-3.5  30-35 minutes were spent completing discharge summary and discharge planning     Follow-up Information     Meredith Staggers, MD Follow up.   Specialty: Physical Medicine and Rehabilitation Why: No follow-up needed Contact information: [address] [place] [address] [phone]         Jake Samples, MD Follow up.   Specialty: Pediatric Cardiology Why: Call for appointment Contact information: [address] [place] [address] [phone]         Ranee Gosselin, MD Follow up.   Specialty: Thoracic Surgery Why: Call for appointment Contact information: [place] [address] [phone]                 Signed: Cathlyn Parsons 01/09/2021, 5:22 AM

## 2021-01-08 NOTE — Progress Notes (Signed)
PROGRESS NOTE   Subjective/Complaints: Feels that breathing is a little better. Able to sleep more comfortably last night  ROS: Patient denies fever, rash, sore throat, blurred vision, nausea, vomiting, diarrhea, cough,  chest pain, joint or back pain, headache, or mood change.     Objective:   No results found. Recent Labs    01/06/21 0718  WBC 8.4  HGB 9.4*  HCT 33.0*  PLT 266   Recent Labs    01/06/21 0718  NA 142  K 4.0  CL 97*  CO2 41*  GLUCOSE 105*  BUN 13  CREATININE 0.45  CALCIUM 9.0    Intake/Output Summary (Last 24 hours) at 01/08/2021 0842 Last data filed at 01/08/2021 0818 Gross per 24 hour  Intake 960 ml  Output --  Net 960 ml     Pressure Injury 12/23/20 Sacrum Mid;Medial Stage 2 -  Partial thickness loss of dermis presenting as a shallow open injury with a red, pink wound bed without slough. fissure like wound that is healing from a more round state (Active)  12/23/20 1626  Location: Sacrum  Location Orientation: Mid;Medial  Staging: Stage 2 -  Partial thickness loss of dermis presenting as a shallow open injury with a red, pink wound bed without slough.  Wound Description (Comments): fissure like wound that is healing from a more round state  Present on Admission: Yes    Physical Exam: Vital Signs Blood pressure 104/67, pulse (!) 118, temperature 98.7 F (37.1 C), temperature source Oral, resp. rate 17, height 5\' 10"  (1.778 m), weight 60.2 kg, SpO2 96 %. Constitutional: No distress . Vital signs reviewed. Frail appearing HEENT: EOMI, oral membranes moist Neck: supple Cardiovascular: tachy with mitral click. No JVD    Respiratory/Chest: CTA Bilaterally without wheezes, perhaps some crackles at bases. Normal effort    GI/Abdomen: BS +, non-tender, non-distended Ext: no clubbing, cyanosis, tr LE edema Psych: pleasant and cooperative Skin: sternal wound well healed with glue. Sacral wound  almost gone Neuro: Pt is cognitively appropriate with normal insight, memory, and awareness. Cranial nerves 2-12 are intact. Sensory exam is normal. Reflexes are 2+ in all 4's. Fine motor coordination is intact. No tremors. Motor 4/5. Musculoskeletal: Full ROM, No pain with AROM or PROM in the neck, trunk, or extremities. Posture appropriate     Assessment/Plan: 1. Functional deficits which require 3+ hours per day of interdisciplinary therapy in a comprehensive inpatient rehab setting.  Physiatrist is providing close team supervision and 24 hour management of active medical problems listed below.  Physiatrist and rehab team continue to assess barriers to discharge/monitor patient progress toward functional and medical goals  Care Tool:  Bathing    Body parts bathed by patient: Right arm,Left arm,Chest,Abdomen,Front perineal area,Buttocks,Right upper leg,Left upper leg,Face,Right lower leg,Left lower leg   Body parts bathed by helper: Left lower leg,Right lower leg     Bathing assist Assist Level: Supervision/Verbal cueing     Upper Body Dressing/Undressing Upper body dressing   What is the patient wearing?: Pull over shirt    Upper body assist Assist Level: Supervision/Verbal cueing    Lower Body Dressing/Undressing Lower body dressing      What is  the patient wearing?: Underwear/pull up,Pants     Lower body assist Assist for lower body dressing: Contact Guard/Touching assist     Toileting Toileting    Toileting assist Assist for toileting: Supervision/Verbal cueing     Transfers Chair/bed transfer  Transfers assist     Chair/bed transfer assist level: Minimal Assistance - Patient > 75%     Locomotion Ambulation   Ambulation assist      Assist level: Contact Guard/Touching assist Assistive device: Walker-rolling Max distance: 150   Walk 10 feet activity   Assist  Walk 10 feet activity did not occur: Safety/medical concerns  Assist level:  Contact Guard/Touching assist Assistive device: Walker-rolling   Walk 50 feet activity   Assist Walk 50 feet with 2 turns activity did not occur: Safety/medical concerns  Assist level: Contact Guard/Touching assist Assistive device: Walker-rolling    Walk 150 feet activity   Assist Walk 150 feet activity did not occur: Safety/medical concerns  Assist level: Contact Guard/Touching assist Assistive device: Walker-rolling    Walk 10 feet on uneven surface  activity   Assist Walk 10 feet on uneven surfaces activity did not occur: Safety/medical concerns         Wheelchair     Assist Will patient use wheelchair at discharge?: No             Wheelchair 50 feet with 2 turns activity    Assist            Wheelchair 150 feet activity     Assist          Blood pressure 104/67, pulse (!) 118, temperature 98.7 F (37.1 C), temperature source Oral, resp. rate 17, height 5\' 10"  (1.778 m), weight 60.2 kg, SpO2 96 %.  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 Sternal precautions. -patient may shower -ELOS/Goals: 01/10/21, supervision goals  -Continue CIR therapies including PT, OT       2. Antithrombotics: -DVT/anticoagulation:Chronic Coumadin  -continue dosing adjustments per pharmacy protocol  -antiplatelet therapy: Aspirin 81 mg daily 3. Pain Management: ContinueHydrocodone as needed 4. Mood:Zoloft 25 mg daily, Wellbutrin 150 mg daily, melatonin 6 mg nightly Xanax as needed -antipsychotic agents: N/A  -discussed some coping skills for anxiety. Encouraged her to use xanax as well if anxiety is coming to a head.  -anxiety seems controlled on 6/6 5. Neuropsych: This patientiscapable of making decisions on herown behalf. 6. Skin/Wound Care:              -continue foam  dressing to sacrum---resolving             -nutrition better 7. Fluids/Electrolytes/Severe protein deficient malutrition: encourage appropriate PO    -added protein supp for low albumin   -appreciate RD help   -decreased megace to once daily 8. Acute blood loss anemia. Follow-up hgb 9.8 today. Continue iron supplement  -held iron supplement d/t nausea 9. Atrial fibrillation. Cardizem 30 mg every 6 hours, metoprolol 25 mg 3 times daily, losartan 25 mg daily, amiodarone 200 mg daily -losartan resumed, still on lasix 40mg  daily  6/7: HR still ranges from mid 100's to 120. Still feels more SOB than she did prior the beginning of last week although sx are rather mild. There may be an anxiety component.  -Her outpatient cardiologist is Dr. Marko Plume at [facility] who specializes in Marfan Syndrome.   -have asked cardiology to see patient to make sure we have meds optimized and  that her volume status is appropriate. (still hasn't seen pt 48 hours after request)   Vitals:   01/08/21 0358 01/08/21 0643  BP: 96/68 104/67  Pulse: (!) 118   Resp: 17   Temp: 98.7 F (37.1 C)   SpO2: 96%    10. Mild constipation   -  senna-s at bedtime---moving bowels   -nausea resolved 11.  Mild hypoxia improved on O2, still sl sob at rest and with activity  -lasix  40mg  daily   -recent CXR stable  6/8:   --consulted cardiology re HR/ volume status. Has yet to see pt   -seems to have done better with addnl dose of lasix yesterday. Repeat today   -might need to go home on supplemental oxygen   -no weights today   -asked pt to make appt with outpt cardiology w/i 1-2 wks of discharge Filed Weights   01/05/21 0500 01/06/21 0500 01/07/21 0630  Weight: 60.7 kg 61.5 kg 60.2 kg     LOS: 16 days A FACE TO FACE EVALUATION WAS PERFORMED  Meredith Staggers 01/08/2021, 8:42 AM

## 2021-01-08 NOTE — Progress Notes (Signed)
ANTICOAGULATION CONSULT NOTE - Follow Up Consult  Pharmacy Consult for Coumadin Indication: St Jude MVR  No Known Allergies  Patient Measurements: Height: 5\' 10"  (177.8 cm) Weight: 60.2 kg (132 lb 11.5 oz) IBW/kg (Calculated) : 68.5  Vital Signs: Temp: 98.7 F (37.1 C) (06/08 0358) Temp Source: Oral (06/08 0358) BP: 104/67 (06/08 0643) Pulse Rate: 118 (06/08 0358)  Labs: Recent Labs    01/06/21 0718 01/07/21 0611 01/08/21 0546  HGB 9.4*  --   --   HCT 33.0*  --   --   PLT 266  --   --   LABPROT 29.3* 29.6* 31.5*  INR 2.8* 2.8* 3.1*  CREATININE 0.45  --   --     Estimated Creatinine Clearance: 86.2 mL/min (by C-G formula based on SCr of 0.45 mg/dL).   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 Goal INR 2.5-3.5.  CBC qMon: Hgb 9.4 relatively stable. Plts stable 260s  - INR 3.1  Goal of Therapy:  INR 2.5-3.5 Monitor platelets by anticoagulation protocol: Yes   Plan:  Coumadin 5mg  PO x1 Daily INR, qMon CBC  Aqsa Sensabaugh A. Levada Dy, PharmD, BCPS, FNKF Clinical Pharmacist [place] Please utilize Amion for appropriate phone number to reach the unit pharmacist ([place])   01/08/2021,7:44 AM

## 2021-01-08 NOTE — Discharge Instructions (Addendum)
Inpatient Rehab Discharge Instructions  Lilley CARRI SPILLERS Discharge date and time: No discharge date for patient encounter.   Activities/Precautions/ Functional Status: Activity: Sterile precautions Diet: Regular Wound Care: Routine skin checks Functional status:  ___ No restrictions     ___ Walk up steps independently ___ 24/7 supervision/assistance   ___ Walk up steps with assistance ___ Intermittent supervision/assistance  ___ Bathe/dress independently ___ Walk with walker     _x__ Bathe/dress with assistance ___ Walk Independently    ___ Shower independently ___ Walk with assistance    ___ Shower with assistance ___ No alcohol     ___ Return to work/school ________  COMMUNITY REFERRALS UPON DISCHARGE:    Outpatient: PT     OT             Agency: [facility]  Phone: [phone]             Appointment Date/Time:*Please expect follow-up within 7-10 business days to schedule your appointment. If you have not received follow-up, be sure to contact the site directly.*  Medical Equipment/Items Ordered: tub transfer bench, 3in1 bedside commode, wheelchair                                                 Agency/Supplier: [facility] [phone]    Special Instructions: No driving smoking or alcohol  Home health nurse to check INR on 01/14/2021       results to Dr Marko Plume [phone] FAX [place] [facility] 2 L as needed with exertion   My questions have been answered and I understand these instructions. I will adhere to these goals and the provided educational materials after my discharge from the hospital.  Patient/Caregiver Signature _______________________________ Date __________  Clinician Signature _______________________________________ Date __________  Please bring this form and your medication list with you to all your follow-up doctor's appointments.    Information on my medicine - Coumadin   (Warfarin)  This medication education was  reviewed with me or my healthcare representative as part of my discharge preparation.   Why was Coumadin prescribed for you? Coumadin was prescribed for you because you have a blood clot or a medical condition that can cause an increased risk of forming blood clots. Blood clots can cause serious health problems by blocking the flow of blood to the heart, lung, or brain. Coumadin can prevent harmful blood clots from forming.   What test will check on my response to Coumadin? While on Coumadin (warfarin) you will need to have an INR test regularly to ensure that your dose is keeping you in the desired range. The INR (international normalized ratio) number is calculated from the result of the laboratory test called prothrombin time (PT).  If an INR APPOINTMENT HAS NOT ALREADY BEEN MADE FOR YOU please schedule an appointment to have this lab work done by your health care provider within 7 days. Your INR goal is usually a number between:  2 to 3 or your provider may give you a more narrow range like 2-2.5.  Ask your health care provider during an office visit what your goal INR is.  What  do you need to  know  About  COUMADIN? Take Coumadin (warfarin) exactly as prescribed by your healthcare provider about the same time each day.  DO NOT stop taking without talking to the doctor  who prescribed the medication.  Stopping without other blood clot prevention medication to take the place of Coumadin may increase your risk of developing a new clot or stroke.  Get refills before you run out.  What do you do if you miss a dose? If you miss a dose, take it as soon as you remember on the same day then continue your regularly scheduled regimen the next day.  Do not take two doses of Coumadin at the same time.  Important Safety Information A possible side effect of Coumadin (Warfarin) is an increased risk of bleeding. You should call your healthcare provider right away if you experience any of the  following: Bleeding from an injury or your nose that does not stop. Unusual colored urine (red or dark brown) or unusual colored stools (red or black). Unusual bruising for unknown reasons. A serious fall or if you hit your head (even if there is no bleeding).  Some foods or medicines interact with Coumadin (warfarin) and might alter your response to warfarin. To help avoid this: Eat a balanced diet, maintaining a consistent amount of Vitamin K. Notify your provider about major diet changes you plan to make. Avoid alcohol or limit your intake to 1 drink for women and 2 drinks for men per day. (1 drink is 5 oz. wine, 12 oz. beer, or 1.5 oz. liquor.)  Make sure that ANY health care provider who prescribes medication for you knows that you are taking Coumadin (warfarin).  Also make sure the healthcare provider who is monitoring your Coumadin knows when you have started a new medication including herbals and non-prescription products.  Coumadin (Warfarin)  Major Drug Interactions  Increased Warfarin Effect Decreased Warfarin Effect  Alcohol (large quantities) Antibiotics (esp. Septra/Bactrim, Flagyl, Cipro) Amiodarone (Cordarone) Aspirin (ASA) Cimetidine (Tagamet) Megestrol (Megace) NSAIDs (ibuprofen, naproxen, etc.) Piroxicam (Feldene) Propafenone (Rythmol SR) Propranolol (Inderal) Isoniazid (INH) Posaconazole (Noxafil) Barbiturates (Phenobarbital) Carbamazepine (Tegretol) Chlordiazepoxide (Librium) Cholestyramine (Questran) Griseofulvin Oral Contraceptives Rifampin Sucralfate (Carafate) Vitamin K   Coumadin (Warfarin) Major Herbal Interactions  Increased Warfarin Effect Decreased Warfarin Effect  Garlic Ginseng Ginkgo biloba Coenzyme Q10 Green tea St. John's wort    Coumadin (Warfarin) FOOD Interactions  Eat a consistent number of servings per week of foods HIGH in Vitamin K (1 serving =  cup)  Collards (cooked, or boiled & drained) Kale (cooked, or boiled &  drained) Mustard greens (cooked, or boiled & drained) Parsley *serving size only =  cup Spinach (cooked, or boiled & drained) Swiss chard (cooked, or boiled & drained) Turnip greens (cooked, or boiled & drained)  Eat a consistent number of servings per week of foods MEDIUM-HIGH in Vitamin K (1 serving = 1 cup)  Asparagus (cooked, or boiled & drained) Broccoli (cooked, boiled & drained, or raw & chopped) Brussel sprouts (cooked, or boiled & drained) *serving size only =  cup Lettuce, raw (green leaf, endive, romaine) Spinach, raw Turnip greens, raw & chopped   These websites have more information on Coumadin (warfarin):  FailFactory.se; VeganReport.com.au;

## 2021-01-08 NOTE — Progress Notes (Signed)
Physical Therapy Session Note  Patient Details  Name: Jennifer [name] MRN: [id number] Date of Birth: 1975/08/15  Today's Date: 01/08/2021 PT Individual Time: 0800-0900 PT Individual Time Calculation (min): 60 min   Short Term Goals: Week 2:  PT Short Term Goal 1 (Week 2): Pt will complete bed mobility with supervision and no bed features. PT Short Term Goal 2 (Week 2): Pt will complete sit to stand consistently with minA. PT Short Term Goal 3 (Week 2): Pt will ambulates 150' with CGA and LRAD. PT Short Term Goal 4 (Week 2): Pt will complete x4 steps with BHRs and minA.  Skilled Therapeutic Interventions/Progress Updates:     Patient in bed upon PT arrival. Patient alert and agreeable to PT session. Patient denied pain during session.  Vitals: Patient on 2L/min throughout session, SPO2 89-98%. Performed RA trial seated EOB at rest, patient desaturated to 88%, required 2L/min to recover to >92% at rest. HR 120s throughout session, variable HR following first gait trial with SPB, patient denied other symptoms, resolved to RR <2 min at rest.   Therapeutic Activity: Bed Mobility: Patient performed supine to sit with mod I.  Transfers: Patient performed sit to/from stand from the bed x1 with min A due to decreased forward weight shift, she progressed to close supervision from the mat table following NMR activities, see below. Provided verbal cues for forward weight shift, looking forward, and increased lateral hip activation for stability when standing.  Gait Training:  Patient ambulated 210 feet x2 using RW with supervision and w/c follow due to decreased activity tolerance. Ambulated with decreased gait speed, decreased step length and height, forward trunk lean with hip flexion, increased knee extension in stance with lateral hip instability, and intermittent downward head gaze. Provided verbal cues for erect posture, paced breathing, visual scanning, and increased hip extension and lateral hip  muscular activation in stance.  Neuromuscular Re-ed: Patient performed the following motor control activities with multimodal cues for proper technique, provided HEP handout at end of session: Access Code: [email or web address]  -Sit to Stand with Orange Resistance band Around Legs - 2 x daily - 7 x weekly - 2 sets - 5 reps During session performed 3 sets of 3 from 24" mat table, 23" mat table, and 21" mat table with supervision focused on forward weight shift and lateral hip activation to stand  -Side Stepping with Orange Resistance Band at [employer] - 2 x daily - 7 x weekly - 2 sets - 5 reps R/L x10 ft  -Seated Hip Abduction with Orange Resistance band - 2 x daily - 7 x weekly - 2 sets - 10 reps - 5 sec hold  Seated Scapular Retraction - 5 x daily - 7 x weekly - 2 sets - 10 reps - 3 sec hold  Patient required increased time and rest breaks with all mobility due to decreased activity tolerance. During rest breaks, discussed home set-up, patient with elevated bed height, SUV height car, and chair lift at home. Educated on not using the chair lift function unless she is unable to stand to prevent muscle atrophy and compensatory strategies with standing at home, patient in agreement. Discussed elevating lower surfaces to allow for alternative seating in the home and the community. Educated on management of home O2 and use of pulse-ox to monitor O2 and HR when symptomatic.   Patient sitting EOB at end of session with breaks locked, bed alarm set, and all needs within reach.    Therapy Documentation Precautions:  Precautions Precautions: Sternal,Fall Restrictions Weight Bearing Restrictions: No   Therapy/Group: Individual Therapy  Othelia Riederer L Breeze Angell PT, DPT  01/08/2021, 4:06 PM

## 2021-01-08 NOTE — Progress Notes (Signed)
Patient ID: Jennifer [name], female   DOB: September 14, 1975, 45 y.o.   MRN: [id number]  Faxed referral for outpatient PT/OT to [facility] (O:[phone]/G:[phone]).  SW met with pt in room to inform her on [facility] trying to make contact to discuss payment for DME. SW provided contact information.   Loralee Pacas, MSW, [place] Office: [phone] Cell: [phone] Fax: [phone]

## 2021-01-08 NOTE — Progress Notes (Signed)
Occupational Therapy Session Note  Patient Details  Name: Jennifer [name] MRN: [id number] Date of Birth: 1976/07/01  Today's Date: 01/08/2021 OT Individual Time: 0930-1030 OT Individual Time Calculation (min): 60 min   Session 2: OT Individual Time: 1445-1530 OT Individual Time Calculation (min): 45 min    Short Term Goals: Week 2:  OT Short Term Goal 1 (Week 2): LTG=STG 2.2 ELOS  Skilled Therapeutic Interventions/Progress Updates:    Pt received sitting EOB with no c/o pain. Pt on 1.5L of O2 and her SpO2 was 98% and HR 120 bpm. Pt agreeable to outing to [facility] with session in conjunction with the recreational therapist. Focus of session on community accessibility, energy conservation strategies, maximizing independence at community level, and fall risk reduction. Pt completed 200 ft of functional mobility to start session with RW, with supervision. Discussed self identification of need for rest break and pacing. Pt completed ambulatory transfer into the public bathroom to discuss accessibility and ways to have husband assist. She took several seated rest breaks throughout session, and was on 2L O2 via [place] with all VSS throughout session- HR remained elevated but did not spike above 122 bpm. Pt practiced ordering [employer] and discussed safe ways to transport food items and ask for help while maintaining independence. Pt returned to her room and was left sitting up in the w/c with all needs met.    Session 2: Pt received sitting in w/c with no c/o pain or SOB. Pt on 1.5 L of O2 via [place]. She requested to take shower. She completed sit > stand from w/c with x2 attempts, mod A. Pt used RW to complete functional mobility into the bathroom with supervision, now on 2L o2 via [place]. Pt required min A to remove ted hose. She completed bathing seated on shower chair with set up assist. Lateral leans used to wash buttocks. Pt had much improved sit > stand from shower chair, requiring only min A with  support of the grab bar. She returned to the w/c where she donned pants and underwear with only min A to power up into standing. Shirt donned supervision. Spo2 at 96% following, HR 124 bpm. Titrated O2 to 1 L while pt completed seated level hair care and grooming- Spo2 92% following. Pt was left sitting up in the w/c with all needs met- O2 on 1 L and Spo2 now 95%.   Therapy Documentation Precautions:  Precautions Precautions: Sternal,Fall Restrictions Weight Bearing Restrictions: No Therapy/Group: Individual Therapy  Curtis Sites 01/08/2021, 6:31 AM

## 2021-01-08 NOTE — Progress Notes (Signed)
Physical Therapy Session Note  Patient Details  Name: Jennifer [name] MRN: [id number] Date of Birth: Jan 22, 1976  Today's Date: 01/08/2021 PT Individual Time: 1502-1530 PT Individual Time Calculation (min): 28 min   Short Term Goals: Week 2:  PT Short Term Goal 1 (Week 2): Pt will complete bed mobility with supervision and no bed features. PT Short Term Goal 2 (Week 2): Pt will complete sit to stand consistently with minA. PT Short Term Goal 3 (Week 2): Pt will ambulates 150' with CGA and LRAD. PT Short Term Goal 4 (Week 2): Pt will complete x4 steps with BHRs and minA.  Skilled Therapeutic Interventions/Progress Updates:     Pt received seated in [facility] and agrees to therapy. No complaint of pain. WC transport to gym for time management. Pt performs squat pivot transfer to mat with supervision and cues on sequencing. Session focused on sit to stand transfer training and strengthening. Pt performs repeated reps of sit to stand from 21" height mat table. Initially pt is unable to complete, but after adjusting body mechanics and adequately shifting weight forward, pt able to complete with CGA to close supervision. Pt progresses to performing sit to stand using arms to push off mat, then stand to sit with hands on knees for eccentric strengthening and balance challenge. Pt performs ~x20 reps total sit to stand during session. Stand pivot back to [facility] with supervision. Left seated in WC with all needs within reach.  Therapy Documentation Precautions:  Precautions Precautions: Sternal,Fall Restrictions Weight Bearing Restrictions: No    Therapy/Group: Individual Therapy  Breck Coons, PT, DPT 01/08/2021, 4:08 PM

## 2021-01-08 NOTE — Progress Notes (Signed)
Recreational Therapy Session Note  Patient Details  Name: Jennifer [name] MRN: [id number] Date of Birth: 01-25-1976 Today's Date: 01/08/2021  Pain: no c/o Skilled Therapeutic Interventions/Progress Updates: Session focused on activity tolerance and community reintegration skills.  Pt ambulated with RW with contact guard assist throughout hospital on 2 L O2 via nasal canula, frequent rest breaks due to fatigue. Discussed/problem solved accessing public elevators & public restroom w/c & ambulatory levels.  Pt identified safety concerns and problem solved through solutions for accessing public restroom including handicapped accessible stall vs using female urinal due to low toilet heights.  Pt also ambulated throughout hospital based [employer], placing order, problem solving how to safely transport items with contact guard assist.  Discussed seating options for restaurants,etc.  Pt sat at high pub style seating area and was easily able to stand from raised seating surface.  Booths and typical seating heights present a challenge as it difficult and energy draining to perform sit-stands at this time from lower surfaces.  Pt stated understanding.  Therapy/Group: Co-Treatment  Crystie Yanko 01/08/2021, 11:13 AM

## 2021-01-09 LAB — BASIC METABOLIC PANEL
Anion gap: 4 — ABNORMAL LOW (ref 5–15)
BUN: 14 mg/dL (ref 6–20)
CO2: 44 mmol/L — ABNORMAL HIGH (ref 22–32)
Calcium: 8.9 mg/dL (ref 8.9–10.3)
Chloride: 92 mmol/L — ABNORMAL LOW (ref 98–111)
Creatinine, Ser: 0.49 mg/dL (ref 0.44–1.00)
GFR, Estimated: >60 mL/min (ref >60)
Glucose, Bld: 92 mg/dL (ref 70–99)
Potassium: 4.1 mmol/L (ref 3.5–5.1)
Sodium: 140 mmol/L (ref 135–145)

## 2021-01-09 LAB — PROTIME-INR
INR: 3.2 — ABNORMAL HIGH (ref 0.8–1.2)
Prothrombin Time: 32.8 seconds — ABNORMAL HIGH (ref 11.4–15.2)

## 2021-01-09 MED ORDER — FUROSEMIDE 40 MG PO TABS
40.0000 mg | ORAL_TABLET | Freq: Once | ORAL | Status: AC
  Administered 2021-01-09: 40 mg via ORAL
  Filled 2021-01-09: qty 1

## 2021-01-09 MED ORDER — WARFARIN SODIUM 5 MG PO TABS
5.0000 mg | ORAL_TABLET | Freq: Once | ORAL | Status: AC
  Administered 2021-01-09: 5 mg via ORAL
  Filled 2021-01-09: qty 1

## 2021-01-09 MED ORDER — MEGESTROL ACETATE 400 MG/10ML PO SUSP
400.0000 mg | Freq: Every day | ORAL | Status: DC
  Administered 2021-01-10: 400 mg via ORAL
  Filled 2021-01-09: qty 10

## 2021-01-09 NOTE — Progress Notes (Addendum)
PROGRESS NOTE   Subjective/Complaints: Breathing improved. Rested comfortably last night. Still desats with activity  ROS: Patient denies fever, rash, sore throat, blurred vision, nausea, vomiting, diarrhea, cough,  chest pain, joint or back pain, headache, or mood change.     Objective:   No results found. No results for input(s): WBC, HGB, HCT, PLT in the last 72 hours.  Recent Labs    01/09/21 0514  NA 140  K 4.1  CL 92*  CO2 44*  GLUCOSE 92  BUN 14  CREATININE 0.49  CALCIUM 8.9    Intake/Output Summary (Last 24 hours) at 01/09/2021 1046 Last data filed at 01/09/2021 0941 Gross per 24 hour  Intake 1118 ml  Output --  Net 1118 ml     Pressure Injury 12/23/20 Sacrum Mid;Medial Stage 2 -  Partial thickness loss of dermis presenting as a shallow open injury with a red, pink wound bed without slough. fissure like wound that is healing from a more round state (Active)  12/23/20 1626  Location: Sacrum  Location Orientation: Mid;Medial  Staging: Stage 2 -  Partial thickness loss of dermis presenting as a shallow open injury with a red, pink wound bed without slough.  Wound Description (Comments): fissure like wound that is healing from a more round state  Present on Admission: Yes    Physical Exam: Vital Signs Blood pressure 101/62, pulse (!) 110, temperature 98.2 F (36.8 C), temperature source Oral, resp. rate 17, height 5\' 10"  (1.778 m), weight 60.7 kg, SpO2 (!) 87 %. Constitutional: No distress . Vital signs reviewed. HEENT: EOMI, oral membranes moist Neck: supple Cardiovascular: systolic click, tachy. No JVD    Respiratory/Chest: CTA Bilaterally without wheezes or rales. Normal effort    GI/Abdomen: BS +, non-tender, non-distended Ext: no clubbing, cyanosis, or edema Psych: pleasant and cooperative Skin: sternal wound well healed with glue. Sacral wound almost gone Neuro: Pt is cognitively appropriate with  normal insight, memory, and awareness. Cranial nerves 2-12 are intact. Sensory exam is normal. Reflexes are 2+ in all 4's. Fine motor coordination is intact. No tremors. Motor 4/5. Musculoskeletal: Full ROM, No pain with AROM or PROM in the neck, trunk, or extremities. Posture appropriate     Assessment/Plan: 1. Functional deficits which require 3+ hours per day of interdisciplinary therapy in a comprehensive inpatient rehab setting. Physiatrist is providing close team supervision and 24 hour management of active medical problems listed below. Physiatrist and rehab team continue to assess barriers to discharge/monitor patient progress toward functional and medical goals  Care Tool:  Bathing    Body parts bathed by patient: Right arm, Left arm, Chest, Abdomen, Right lower leg, Left lower leg, Face, Left upper leg, Right upper leg, Buttocks, Front perineal area   Body parts bathed by helper: Left lower leg, Right lower leg     Bathing assist Assist Level: Set up assist     Upper Body Dressing/Undressing Upper body dressing   What is the patient wearing?: Pull over shirt    Upper body assist Assist Level: Independent with assistive device    Lower Body Dressing/Undressing Lower body dressing      What is the patient wearing?: Underwear/pull up,  Pants     Lower body assist Assist for lower body dressing: Supervision/Verbal cueing     Toileting Toileting    Toileting assist Assist for toileting: Supervision/Verbal cueing     Transfers Chair/bed transfer  Transfers assist     Chair/bed transfer assist level: Supervision/Verbal cueing     Locomotion Ambulation   Ambulation assist      Assist level: Supervision/Verbal cueing Assistive device: Walker-rolling Max distance: 210 ft   Walk 10 feet activity   Assist  Walk 10 feet activity did not occur: Safety/medical concerns  Assist level: Supervision/Verbal cueing Assistive device: Walker-rolling   Walk  50 feet activity   Assist Walk 50 feet with 2 turns activity did not occur: Safety/medical concerns  Assist level: Supervision/Verbal cueing Assistive device: Walker-rolling    Walk 150 feet activity   Assist Walk 150 feet activity did not occur: Safety/medical concerns  Assist level: Supervision/Verbal cueing Assistive device: Walker-rolling    Walk 10 feet on uneven surface  activity   Assist Walk 10 feet on uneven surfaces activity did not occur: Safety/medical concerns         Wheelchair     Assist Will patient use wheelchair at discharge?: No             Wheelchair 50 feet with 2 turns activity    Assist            Wheelchair 150 feet activity     Assist          Blood pressure 101/62, pulse (!) 110, temperature 98.2 F (36.8 C), temperature source Oral, resp. rate 17, height 5\' 10"  (1.778 m), weight 60.7 kg, SpO2 (!) 87 %.  Medical Problem List and Plan: 1.  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  Sternal precautions.             -patient may shower             -ELOS/Goals: 01/10/21, supervision goals  -Continue CIR therapies including PT, OT       2.  Antithrombotics: -DVT/anticoagulation: Chronic Coumadin              -continue dosing adjustments per pharmacy protocol              -antiplatelet therapy: Aspirin 81 mg daily 3. Pain Management: Continue Hydrocodone as needed 4. Mood: Zoloft 25 mg daily, Wellbutrin 150 mg daily, melatonin 6 mg nightly Xanax as needed             -antipsychotic agents: N/A  -discussed some coping skills for anxiety. Encouraged her to use xanax as well if anxiety is coming to a head.  -anxiety seems controlled on 6/6 5. Neuropsych: This patient is capable of making decisions on her own behalf. 6. Skin/Wound Care:               -continue foam dressing to sacrum---resolving             -nutrition better 7.  Fluids/Electrolytes/Severe protein deficient malutrition:  encourage appropriate PO    -added protein supp for low albumin   -appreciate RD help   -decreased megace to once daily 8.  Acute blood loss anemia.  Follow-up hgb 9.8 today.  Continue iron supplement  -held iron supplement d/t nausea 9.  Atrial fibrillation.  Cardizem 30 mg every 6 hours, metoprolol 25 mg 3 times daily, losartan 25 mg daily, amiodarone 200 mg daily             -  losartan resumed, still on lasix 40mg  daily  6/9 HR still ranges from mid 100's to 120. Sx are improving with diuresis however.  -Her outpatient cardiologist is Dr. Marko Plume at [facility] who specializes in Marfan Syndrome.   -cardiology consult requested but never came to see patient   Vitals:   01/09/21 0359 01/09/21 0838  BP: 102/70 101/62  Pulse: (!) 116 (!) 110  Resp: 18 17  Temp: 97.9 F (36.6 C) 98.2 F (36.8 C)  SpO2: 98% (!) 87%   10. Mild constipation               -  senna-s at bedtime---moving bowels   -nausea resolved 11.  Mild hypoxia improved on O2, still sl sob at rest and with activity  -lasix  40mg  daily   -recent CXR stable  6/9:   --consulted cardiology re HR/ volume status. Never saw   -continue bid lasix until discharge   -might need to go home on supplemental oxygen   -weight stable today   -asked pt to make appt with outpt cardiology w/i 1-2 wks of discharge   -will have to go home initially on supplemental oxygen Filed Weights   01/06/21 0500 01/07/21 0630 01/09/21 0359  Weight: 61.5 kg 60.2 kg 60.7 kg     LOS: 17 days A FACE TO FACE EVALUATION WAS PERFORMED  Meredith Staggers 01/09/2021, 10:46 AM

## 2021-01-09 NOTE — Plan of Care (Signed)
  Problem: RH Balance Goal: LTG: Patient will maintain dynamic sitting balance (OT) Description: LTG:  Patient will maintain dynamic sitting balance with assistance during activities of daily living (OT) Outcome: Completed/Met Goal: LTG Patient will maintain dynamic standing with ADLs (OT) Description: LTG:  Patient will maintain dynamic standing balance with assist during activities of daily living (OT)  Outcome: Completed/Met   Problem: Sit to Stand Goal: LTG:  Patient will perform sit to stand in prep for activites of daily living with assistance level (OT) Description: LTG:  Patient will perform sit to stand in prep for activites of daily living with assistance level (OT) Outcome: Completed/Met   Problem: RH Grooming Goal: LTG Patient will perform grooming w/assist,cues/equip (OT) Description: LTG: Patient will perform grooming with assist, with/without cues using equipment (OT) Outcome: Completed/Met   Problem: RH Bathing Goal: LTG Patient will bathe all body parts with assist levels (OT) Description: LTG: Patient will bathe all body parts with assist levels (OT) Outcome: Completed/Met   Problem: RH Dressing Goal: LTG Patient will perform upper body dressing (OT) Description: LTG Patient will perform upper body dressing with assist, with/without cues (OT). Outcome: Completed/Met Goal: LTG Patient will perform lower body dressing w/assist (OT) Description: LTG: Patient will perform lower body dressing with assist, with/without cues in positioning using equipment (OT) Outcome: Completed/Met   Problem: RH Toileting Goal: LTG Patient will perform toileting task (3/3 steps) with assistance level (OT) Description: LTG: Patient will perform toileting task (3/3 steps) with assistance level (OT)  Outcome: Completed/Met   Problem: RH Toilet Transfers Goal: LTG Patient will perform toilet transfers w/assist (OT) Description: LTG: Patient will perform toilet transfers with assist,  with/without cues using equipment (OT) Outcome: Completed/Met   Problem: RH Tub/Shower Transfers Goal: LTG Patient will perform tub/shower transfers w/assist (OT) Description: LTG: Patient will perform tub/shower transfers with assist, with/without cues using equipment (OT) Outcome: Completed/Met   

## 2021-01-09 NOTE — Progress Notes (Signed)
Occupational Therapy Session Note  Patient Details  Name: Jennifer [name] MRN: [id number] Date of Birth: 1976/02/08  Today's Date: 01/09/2021 OT Individual Time: 3220-2542 OT Individual Time Calculation (min): 57 min   Short Term Goals: Week 1:  OT Short Term Goal 1 (Week 1): Pt will complete stand-pivot to [facility] with mod OT Short Term Goal 1 - Progress (Week 1): Met OT Short Term Goal 2 (Week 1): Pt will tolerate standing for 2 minutes within BADL task OT Short Term Goal 2 - Progress (Week 1): Met OT Short Term Goal 3 (Week 1): Pt will complete 1 step of LB dressing task OT Short Term Goal 3 - Progress (Week 1): Met  Skilled Therapeutic Interventions/Progress Updates:    Pt greeted semi-reclined in bed and agreeable to OT treatment session. Pt declined BADLs since she showered yesterday. Pt completed bed mobility mod I. She then stood from raised bed height to simulate high bed at home with RW and supervision. Pt ambulated to the sink w/ supervision and stood to complete grooming tasks without LOB. Pt brought down to therapy apartment and practiced tub bench and BSC transfers. Pt able to complete all transfers with supervision and increased time and effort to power up into standing. At times, pt needed multiple trials to get to standing. Most of her stands were supervision, but she needed occasional CGA to get all the way up. Standing balance/endurance with standing memory task. Pt tolerated 4 minutes of standing. Pt reported need to go to the bathroom. Pt brought back to room and ambulated into bathroom w/ RW and supervision. Pt able to manage clothing and sat on commode with supervision. Pt voided bowel and bladder, then completed peri-care mod I. Pt fatigued after multiple sit<>stands and needed CGA to get up from [facility] over toilet. Pt then stood at the sink to wash hands with min cues for RW positioning at the sink. OT checked O2 at 100% on 2L. OT placed pt on RA and rest and she was maintaining at  95% after 4 minutes. OT then had pt stand with 2 trials, cues to scoot forward, and supervision. Pt stood for 2 minutes with desat to 88%. Pt took rest break and performed deep breathing to recover to 92%. OT left pt on RA seated in wc with needs met. Informed nurse tech that pt was left off of O2 and to go check SpO2 after a few more minutes.   Therapy Documentation Precautions:  Precautions Precautions: Sternal, Fall Restrictions Weight Bearing Restrictions: No Pain:  Denies pain   Therapy/Group: Individual Therapy  Valma Cava 01/09/2021, 9:01 AM

## 2021-01-09 NOTE — Progress Notes (Addendum)
Patient ID: Jennifer [name], female   DOB: [birth year], 45 y.o.   MRN: [id number]  Medical team confirms pt will need home o2.  SW returned phone call to pt to inform she will d/c with an oxygen tank, and there will be follow-up from [employer] to schedule delivery.   SW ordered oxygen with [employer].   Loralee Pacas, MSW, [place] Office: [phone] Cell: [phone] Fax: [phone]

## 2021-01-09 NOTE — Progress Notes (Signed)
Physical Therapy Discharge Summary  Patient Details  Name: Jennifer [name] MRN: [id number] Date of Birth: Jul 05, 1976  Today's Date: 01/09/2021 PT Individual Time: [phone] and 2947-6546 PT Individual Time Calculation (min): 57 min and 27 min   Patient has met 8 of 8 long term goals due to improved activity tolerance, improved balance, improved postural control, and increased strength.  Patient to discharge at an ambulatory level Supervision.   Patient's care partner is independent to provide the necessary physical assistance at discharge.  Reasons goals not met: NA  Recommendation:  Patient will benefit from ongoing skilled PT services in outpatient setting to continue to advance safe functional mobility, address ongoing impairments in strength, balance, transfers, ambulation, and minimize fall risk.  Equipment: Manual WC  Reasons for discharge: treatment goals met and discharge from hospital  Patient/family agrees with progress made and goals achieved: Yes  Skilled Therapeutic Interventions:  1st Session: Pt received sitting in [facility] and agrees to therapy. No complaint of pain. Pt on room air initially. Vitals taken and O2 sats at 87% with HR 115 bpm. Pt then put on 1L O2 via nasal cannula and O2 sats quickly increase to 97%. WC transport to gym for time management. Pt performs sit to stand from [facility] with supervision after several attempts and verbal cues on body mechanics and sequencing. Pt ambulates x100' with RW and supervision with cues for upright gaze and increased gait speed. Pt initially ambulates on room air and sats drop from 92% to 84%. Pt then ambulates x100' on 1L O2 and sats remain >93%. Following seated rest break, pt ambulates x200' with RW on 1L O2. Pt then performs x12 steps with bilateral hand rails and cues for step pattern and sequencing. Pt left seated in WC with alarm intact and all needs within reach.  2nd Session: Pt received seated in [facility] and agrees to therapy. No  complaint of pain. Husband present for family education. WC transport to gym for time management. Sit to stand from [facility] requires minA. Car transfer with RW and verbal cues on positioning and safety. Pt then performs ramp navigation and ambulating over mulch and curb with RW and cues for posture and RW management. Pt performs multiple reps of sit to stand from mat table with verbal cues for body mechanics, including controlling descent and keeping COG anterior to promote increased strengthening and safety. Pt then ambulates x300' back to room with RW. Left seated in WC with alarm intact and all needs within reach.   PT Discharge Precautions/Restrictions Precautions Precautions: Sternal;Fall Restrictions Weight Bearing Restrictions: No Vision/Perception  Perception Perception: Within Functional Limits Praxis Praxis: Intact  Cognition Overall Cognitive Status: Within Functional Limits for tasks assessed Arousal/Alertness: Awake/alert Orientation Level: Oriented X4 Memory: Appears intact Safety/Judgment: Appears intact Sensation Sensation Light Touch: Appears Intact Coordination Gross Motor Movements are Fluid and Coordinated: Yes Fine Motor Movements are Fluid and Coordinated: Yes Motor  Motor Motor - Discharge Observations: continues to have generalized weakness, much improvement since eval  Mobility Bed Mobility Bed Mobility: Sit to Supine;Supine to Sit Supine to Sit: Independent Sit to Supine: Independent Transfers Transfers: Sit to Stand;Stand to Sit;Stand Pivot Transfers Sit to Stand: Supervision/Verbal cueing;Contact Guard/Touching assist Stand to Sit: Supervision/Verbal cueing Stand Pivot Transfers: Supervision/Verbal cueing Locomotion  Gait Gait Assistance: Supervision/Verbal cueing Gait Distance (Feet): 200 Feet Assistive device: Rolling walker Gait Assistance Details: Verbal cues for technique;Tactile cues for sequencing Gait Gait: Yes Gait Pattern: Impaired Gait  Pattern:  (slight forward flexion) Gait velocity: Decreased  Stairs / Additional Locomotion Stairs: Yes Stairs Assistance: Supervision/Verbal cueing Stair Management Technique: Two rails Number of Stairs: 12 Height of Stairs: 6 Ramp: Supervision/Verbal cueing Curb: Supervision/Verbal cueing Wheelchair Mobility Wheelchair Mobility: No  Trunk/Postural Assessment  Cervical Assessment Cervical Assessment:  (forward head) Thoracic Assessment Thoracic Assessment:  (rounded shoulders) Lumbar Assessment Lumbar Assessment:  (posterior pelvic tilt) Postural Control Postural Control: Within Functional Limits  Balance Balance Balance Assessed: Yes Static Sitting Balance Static Sitting - Balance Support: Feet supported Static Sitting - Level of Assistance: 7: Independent Dynamic Sitting Balance Dynamic Sitting - Balance Support: During functional activity Dynamic Sitting - Level of Assistance: 7: Independent Static Standing Balance Static Standing - Balance Support: During functional activity Static Standing - Level of Assistance: 5: Stand by assistance Dynamic Standing Balance Dynamic Standing - Balance Support: During functional activity Dynamic Standing - Level of Assistance: 5: Stand by assistance Extremity Assessment  RUE Assessment RUE Assessment: Within Functional Limits LUE Assessment LUE Assessment: Within Functional Limits RLE Assessment General Strength Comments: Grossly 4/5 LLE Assessment General Strength Comments: Grossly 4/5    Breck Coons, PT, DPT 01/09/2021, 12:29 PM

## 2021-01-09 NOTE — Progress Notes (Signed)
ANTICOAGULATION CONSULT NOTE - Follow Up Consult  Pharmacy Consult for Coumadin Indication: St Jude MVR  No Known Allergies  Patient Measurements: Height: 5\' 10"  (177.8 cm) Weight: 60.7 kg (133 lb 13.1 oz) IBW/kg (Calculated) : 68.5  Vital Signs: Temp: 97.9 F (36.6 C) (06/09 0359) Temp Source: Oral (06/09 0359) BP: 102/70 (06/09 0359) Pulse Rate: 116 (06/09 0359)  Labs: Recent Labs    01/07/21 0611 01/08/21 0546 01/09/21 0514  LABPROT 29.6* 31.5* 32.8*  INR 2.8* 3.1* 3.2*  CREATININE  --   --  0.49     Estimated Creatinine Clearance: 86.9 mL/min (by C-G formula based on SCr of 0.49 mg/dL).   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 Goal INR 2.5-3.5.  CBC qMon: Hgb 9.4 relatively stable. Plts stable 260s  - INR 3.1>3.2  Goal of Therapy:  INR 2.5-3.5 Monitor platelets by anticoagulation protocol: Yes   Plan:  Coumadin 5mg  PO x1 Daily INR, qMon CBC  Monik Lins A. Levada Dy, PharmD, BCPS, FNKF Clinical Pharmacist [place] Please utilize Amion for appropriate phone number to reach the unit pharmacist ([place])   01/09/2021,7:52 AM

## 2021-01-09 NOTE — Progress Notes (Signed)
Recreational Therapy Session Note  Patient Details  Name: KALANDRA MASTERS MRN: [id number] Date of Birth: [birth year] Today's Date: 01/09/2021  Pain: no c/o Skilled Therapeutic Interventions/Progress Updates: session focused on activity tolerance and community reintegration.  Pt taken outside via w/c for time & energy management.  Once outside, pt ambulated on outdoor uneven surfaces using RW ~100' with contact guard assist, min assist for sit->stand on 2 L O2 via nasal canula.  Discussed energy conservation techniques, oxygen use during community pursuits and safety concerns for outdoor activities. Pt stated understanding.  Transitioned to ambulation using RW in grassy outdoor area with contact guard assist. Pt is excited about discharge home tomorrow.  Therapy/Group: Co-Treatment   Mekhi Sonn 01/09/2021, 3:43 PM

## 2021-01-09 NOTE — Progress Notes (Signed)
Occupational Therapy Session Note  Patient Details  Name: Jennifer [name] MRN: [id number] Date of Birth: [birth year]  Today's Date: 01/09/2021 OT Individual Time: 0300-9233 OT Individual Time Calculation (min): 55 min    Short Term Goals: Week 2:  OT Short Term Goal 1 (Week 2): LTG=STG 2.2 ELOS  Skilled Therapeutic Interventions/Progress Updates:    Met pt sitting in w/c, pt agreed to co-treat with TR. Treatment session was focused on ambulation on different terrains, sit > stand transfers and improving overall endurance. Pt was brought outside to ambulate throughout courtyard, down a paved hill and throughout a grassed area. Pt was CGA for sit > stand transfer from w/c to RW.  Pt required a rest break once ambulated down hill. TR educated pt on energy conservation techniques and the use of O2 while out in the community. Pt was brought to rehab dayroom and played [employer] while standing to improve overall endurance during activities. Left pt sitting in w/c with all needs met.   Therapy Documentation Precautions:  Precautions Precautions: Sternal, Fall Restrictions Weight Bearing Restrictions: No   Vital Signs: Therapy Vitals Temp: 98.2 F (36.8 C) Temp Source: Oral Pulse Rate: (!) 110 Resp: 17 BP: 101/62 Patient Position (if appropriate): Sitting Oxygen Therapy SpO2: (!) 87 % O2 Device: Room Air Pain: Pain Assessment Pain Scale: 0-10 Pain Score: 0-No pain    Therapy/Group: Individual Therapy  Vannie Hilgert 01/09/2021, 12:02 PM

## 2021-01-09 NOTE — Progress Notes (Addendum)
Occupational Therapy Discharge Summary  Patient Details  Name: Jennifer [name] MRN: [id number] Date of Birth: Apr 17, 1976   Patient has met 10 of 10 long term goals due to improved activity tolerance, improved balance, postural control, and ability to compensate for deficits.  Patient to discharge at [facility] Supervision /CGA level.  Patient's care partner is independent to provide the necessary physical assistance at discharge for higher level iADL tasks.    Reasons goals not met: n/a  Recommendation:  Patient will benefit from ongoing skilled OT services in outpatient setting to continue to advance functional skills in the area of BADL.  Equipment: 3-in-1 BSC, tub transfer bench, wc, RW  Reasons for discharge: treatment goals met and discharge from hospital  Patient/family agrees with progress made and goals achieved: Yes  OT Discharge Precautions/Restrictions  Precautions Precautions: Sternal;Fall Restrictions Weight Bearing Restrictions: No Pain Pain Assessment Pain Scale: 0-10 Pain Score: 0-No pain ADL ADL Eating: Independent Grooming: Modified independent Upper Body Bathing: Modified independent Lower Body Bathing: Supervision/safety Upper Body Dressing: Modified independent (Device) Lower Body Dressing: Supervision/safety Toileting: Supervision/safety Toilet Transfer: Close supervision Tub/Shower Transfer: Close supervison Cognition Overall Cognitive Status: Within Functional Limits for tasks assessed Arousal/Alertness: Awake/alert Orientation Level: Oriented X4 Memory: Appears intact Safety/Judgment: Appears intact Sensation Sensation Light Touch: Appears Intact Coordination Gross Motor Movements are Fluid and Coordinated: Yes Fine Motor Movements are Fluid and Coordinated: Yes Motor  Motor Motor - Discharge Observations: continues to have generalized weakness, much improvement since eval Mobility  Bed Mobility Bed Mobility: Sit to Supine;Supine to  Sit Supine to Sit: Independent Sit to Supine: Independent Transfers Sit to Stand: Supervision/Verbal cueing;Contact Guard/Touching assist Stand to Sit: Supervision/Verbal cueing  Balance Balance Balance Assessed: Yes Static Sitting Balance Static Sitting - Balance Support: Feet supported Static Sitting - Level of Assistance: 7: Independent Dynamic Sitting Balance Dynamic Sitting - Balance Support: During functional activity Dynamic Sitting - Level of Assistance: 7: Independent Static Standing Balance Static Standing - Balance Support: During functional activity Static Standing - Level of Assistance: 5: Stand by assistance Dynamic Standing Balance Dynamic Standing - Balance Support: During functional activity Dynamic Standing - Level of Assistance: 5: Stand by assistance Extremity/Trunk Assessment RUE Assessment RUE Assessment: Within Functional Limits LUE Assessment LUE Assessment: Within Functional Limits   Daneen Schick Mann [place] 01/09/2021, 12:51 PM

## 2021-01-09 NOTE — Progress Notes (Signed)
Nutrition Follow-up  DOCUMENTATION CODES:   Severe malnutrition in context of chronic illness, Underweight  INTERVENTION: Continue Ensure Enlive po BID, each supplement provides 350 kcal and 20 grams of protein   Continue Ensure Max po once daily, each supplement provides 150 kcal and 30 grams of protein.    Encourage adequate PO intake.   NUTRITION DIAGNOSIS:   Severe Malnutrition related to chronic illness as evidenced by severe fat depletion, severe muscle depletion; ongoing  GOAL:   Patient will meet greater than or equal to 90% of their needs; met  MONITOR:   PO intake, Supplement acceptance, Skin, Weight trends, Labs, I & O's  REASON FOR ASSESSMENT:   Malnutrition Screening Tool    ASSESSMENT:   45 year old right-handed female with history of congenital pectus excavatum, Marfan syndrome, mitral valve prolapse and known mild to moderate mitral regurgitation. CTA chest no pulmonary emboli however findings compatible fluid overload including cardiomegaly and small pericardial effusion small bilateral pleural effusion pulmonary interstitial edema.  CT abdomen pelvis with contrast demonstrated small volume ascites. Patient did undergo diuresis. EE showed endocarditis with mildly decreased ejection fraction of 45 to 50%. 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 Pt with decreased functional mobility was admitted for a comprehensive rehab program.  Meal completion has been 100%. Appetite good. Pt currently has Ensure ordered and has been consuming them. RD to continue with current orders to aid in caloric and protein needs. Labs and medications reviewed.   Diet Order:   Diet Order             Diet regular Room service appropriate? Yes; Fluid consistency: Thin  Diet effective now                   EDUCATION NEEDS:   Not appropriate for education at this time  Skin:  Skin  Assessment: Reviewed RN Assessment Skin Integrity Issues:: Stage II Stage II: sacrum  Last BM:  6/8  Height:   Ht Readings from Last 1 Encounters:  12/23/20 5' 10" (1.778 m)    Weight:   Wt Readings from Last 1 Encounters:  01/09/21 60.7 kg    Ideal Body Weight:  68.18 kg  BMI:  Body mass index is 19.2 kg/m.  Estimated Nutritional Needs:   Kcal:  1800-2000  Protein:  85-100 grams  Fluid:  >/= 1.8 L/day  Corrin Parker, MS, RD, LDN RD pager number/after hours weekend pager number on Amion.

## 2021-01-10 ENCOUNTER — Other Ambulatory Visit (HOSPITAL_COMMUNITY): Payer: Self-pay

## 2021-01-10 ENCOUNTER — Other Ambulatory Visit: Payer: Self-pay | Admitting: *Deleted

## 2021-01-10 DIAGNOSIS — Z952 Presence of prosthetic heart valve: Secondary | ICD-10-CM

## 2021-01-10 DIAGNOSIS — E43 Unspecified severe protein-calorie malnutrition: Secondary | ICD-10-CM

## 2021-01-10 DIAGNOSIS — J9611 Chronic respiratory failure with hypoxia: Secondary | ICD-10-CM | POA: Diagnosis not present

## 2021-01-10 DIAGNOSIS — R5381 Other malaise: Secondary | ICD-10-CM | POA: Diagnosis not present

## 2021-01-10 LAB — PROTIME-INR
INR: 3.6 — ABNORMAL HIGH (ref 0.8–1.2)
Prothrombin Time: 35.5 seconds — ABNORMAL HIGH (ref 11.4–15.2)

## 2021-01-10 MED ORDER — THIAMINE HCL 100 MG PO TABS
100.0000 mg | ORAL_TABLET | Freq: Every day | ORAL | 0 refills | Status: AC
  Filled 2021-01-10: qty 30, 30d supply, fill #0

## 2021-01-10 MED ORDER — LOSARTAN POTASSIUM 25 MG PO TABS
25.0000 mg | ORAL_TABLET | Freq: Every day | ORAL | 0 refills | Status: AC
  Filled 2021-01-10: qty 30, 30d supply, fill #0

## 2021-01-10 MED ORDER — WARFARIN SODIUM 2.5 MG PO TABS: 2.5000 mg | ORAL_TABLET | Freq: Once | ORAL | Status: DC

## 2021-01-10 MED ORDER — ALPRAZOLAM 0.5 MG PO TABS
0.5000 mg | ORAL_TABLET | Freq: Two times a day (BID) | ORAL | 0 refills | Status: AC | PRN
  Filled 2021-01-10: qty 30, 15d supply, fill #0

## 2021-01-10 MED ORDER — METOPROLOL TARTRATE 25 MG PO TABS
25.0000 mg | ORAL_TABLET | Freq: Three times a day (TID) | ORAL | 0 refills | Status: AC
  Filled 2021-01-10: qty 90, 30d supply, fill #0

## 2021-01-10 MED ORDER — PANTOPRAZOLE SODIUM 40 MG PO TBEC
40.0000 mg | DELAYED_RELEASE_TABLET | Freq: Every day | ORAL | 0 refills | Status: AC
  Filled 2021-01-10: qty 30, 30d supply, fill #0

## 2021-01-10 MED ORDER — WARFARIN SODIUM 2.5 MG PO TABS
2.5000 mg | ORAL_TABLET | Freq: Every day | ORAL | 0 refills | Status: AC
  Filled 2021-01-10: qty 30, 30d supply, fill #0

## 2021-01-10 MED ORDER — MELATONIN 5 MG PO TABS
5.0000 mg | ORAL_TABLET | Freq: Every day | ORAL | 0 refills | Status: AC
  Filled 2021-01-10: qty 30, 30d supply, fill #0

## 2021-01-10 MED ORDER — VITAMIN C 500 MG PO TABS
500.0000 mg | ORAL_TABLET | Freq: Every day | ORAL | 0 refills | Status: AC
  Filled 2021-01-10: qty 30, 30d supply, fill #0

## 2021-01-10 MED ORDER — DILTIAZEM HCL 30 MG PO TABS
30.0000 mg | ORAL_TABLET | Freq: Four times a day (QID) | ORAL | 0 refills | Status: AC
  Filled 2021-01-10: qty 120, 30d supply, fill #0

## 2021-01-10 MED ORDER — ACETAMINOPHEN 325 MG PO TABS: 325.0000 mg | ORAL_TABLET | ORAL | Status: AC | PRN

## 2021-01-10 MED ORDER — FUROSEMIDE 40 MG PO TABS
40.0000 mg | ORAL_TABLET | Freq: Every day | ORAL | 0 refills | Status: AC
  Filled 2021-01-10: qty 30, 30d supply, fill #0

## 2021-01-10 MED ORDER — SERTRALINE HCL 25 MG PO TABS
25.0000 mg | ORAL_TABLET | Freq: Every day | ORAL | 0 refills | Status: AC
  Filled 2021-01-10: qty 30, 30d supply, fill #0

## 2021-01-10 MED ORDER — HYDROCODONE-ACETAMINOPHEN 5-325 MG PO TABS
1.0000 | ORAL_TABLET | Freq: Four times a day (QID) | ORAL | 0 refills | Status: AC | PRN
  Filled 2021-01-10: qty 30, 7d supply, fill #0

## 2021-01-10 MED ORDER — BUPROPION HCL ER (XL) 150 MG PO TB24
150.0000 mg | ORAL_TABLET | Freq: Every day | ORAL | 0 refills | Status: AC
  Filled 2021-01-10: qty 30, 30d supply, fill #0

## 2021-01-10 MED ORDER — AMIODARONE HCL 200 MG PO TABS
200.0000 mg | ORAL_TABLET | Freq: Every day | ORAL | 0 refills | Status: AC
  Filled 2021-01-10: qty 30, 30d supply, fill #0

## 2021-01-10 NOTE — Progress Notes (Signed)
PROGRESS NOTE   Subjective/Complaints: Excited about going home. Breathing better with addnl lasix this week. Feels prepared to go home. Still requiring supplemental oxygen  ROS: Patient denies fever, rash, sore throat, blurred vision, nausea, vomiting, diarrhea, cough,   chest pain, joint or back pain, headache, or mood change.     Objective:   No results found. No results for input(s): WBC, HGB, HCT, PLT in the last 72 hours.  Recent Labs    01/09/21 0514  NA 140  K 4.1  CL 92*  CO2 44*  GLUCOSE 92  BUN 14  CREATININE 0.49  CALCIUM 8.9    Intake/Output Summary (Last 24 hours) at 01/10/2021 1021 Last data filed at 01/10/2021 0728 Gross per 24 hour  Intake 354 ml  Output --  Net 354 ml         Physical Exam: Vital Signs Blood pressure 102/66, pulse (!) 107, temperature 98.4 F (36.9 C), temperature source Oral, resp. rate 17, height 5\' 10"  (1.778 m), weight 60.7 kg, SpO2 97 %. Constitutional: No distress . Vital signs reviewed. HEENT: EOMI, oral membranes moist Neck: supple Cardiovascular: tachy with mitral click. No JVD    Respiratory/Chest: CTA Bilaterally without wheezes or rales. Normal effort    GI/Abdomen: BS +, non-tender, non-distended Ext: no clubbing, cyanosis, tr/minimal edema Psych: pleasant and cooperative Skin: sternal wound well healed with glue. Sacral wound almost gone Neuro: Pt is cognitively appropriate with normal insight, memory, and awareness. Cranial nerves 2-12 are intact. Sensory exam is normal. Reflexes are 2+ in all 4's. Fine motor coordination is intact. No tremors. Motor 4/5. Musculoskeletal: Full ROM, No pain with AROM or PROM in the neck, trunk, or extremities. Posture appropriate     Assessment/Plan: 1. Functional deficits which require 3+ hours per day of interdisciplinary therapy in a comprehensive inpatient rehab setting. Physiatrist is providing close team supervision  and 24 hour management of active medical problems listed below. Physiatrist and rehab team continue to assess barriers to discharge/monitor patient progress toward functional and medical goals  Care Tool:  Bathing    Body parts bathed by patient: Right arm, Left arm, Chest, Abdomen, Right lower leg, Left lower leg, Face, Left upper leg, Right upper leg, Buttocks, Front perineal area   Body parts bathed by helper: Left lower leg, Right lower leg     Bathing assist Assist Level: Set up assist     Upper Body Dressing/Undressing Upper body dressing   What is the patient wearing?: Pull over shirt    Upper body assist Assist Level: Independent with assistive device    Lower Body Dressing/Undressing Lower body dressing      What is the patient wearing?: Underwear/pull up, Pants     Lower body assist Assist for lower body dressing: Supervision/Verbal cueing     Toileting Toileting    Toileting assist Assist for toileting: Supervision/Verbal cueing     Transfers Chair/bed transfer  Transfers assist     Chair/bed transfer assist level: Supervision/Verbal cueing     Locomotion Ambulation   Ambulation assist      Assist level: Supervision/Verbal cueing Assistive device: Walker-rolling Max distance: 210 ft   Walk 10 feet activity  Assist  Walk 10 feet activity did not occur: Safety/medical concerns  Assist level: Supervision/Verbal cueing Assistive device: Walker-rolling   Walk 50 feet activity   Assist Walk 50 feet with 2 turns activity did not occur: Safety/medical concerns  Assist level: Supervision/Verbal cueing Assistive device: Walker-rolling    Walk 150 feet activity   Assist Walk 150 feet activity did not occur: Safety/medical concerns  Assist level: Supervision/Verbal cueing Assistive device: Walker-rolling    Walk 10 feet on uneven surface  activity   Assist Walk 10 feet on uneven surfaces activity did not occur: Safety/medical  concerns         Wheelchair     Assist Will patient use wheelchair at discharge?: No             Wheelchair 50 feet with 2 turns activity    Assist            Wheelchair 150 feet activity     Assist          Blood pressure 102/66, pulse (!) 107, temperature 98.4 F (36.9 C), temperature source Oral, resp. rate 17, height 5\' 10"  (1.778 m), weight 60.7 kg, SpO2 97 %.  Medical Problem List and Plan: 1.  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  Sternal precautions.             -dc home today  -f/u appt made with [facility] cardiology      2.  Antithrombotics: -DVT/anticoagulation: Chronic Coumadin              -continue dosing adjustments per pharmacy protocol              -antiplatelet therapy: Aspirin 81 mg daily 3. Pain Management: Continue Hydrocodone as needed 4. Mood: Zoloft 25 mg daily, Wellbutrin 150 mg daily, melatonin 6 mg nightly Xanax as needed             -antipsychotic agents: N/A  -anxiety seems controlled on 6/10 5. Neuropsych: This patient is capable of making decisions on her own behalf. 6. Skin/Wound Care:               -continue foam dressing to sacrum---resolving             -nutrition better 7. Fluids/Electrolytes/Severe protein deficient malutrition:  encourage appropriate PO    -added protein supp for low albumin   -appreciate RD help   -decreased megace to once daily 8.  Acute blood loss anemia.  Follow-up hgb 9.8 today.  Continue iron supplement  -held iron supplement d/t nausea 9.  Atrial fibrillation.  Cardizem 30 mg every 6 hours, metoprolol 25 mg 3 times daily, losartan 25 mg daily, amiodarone 200 mg daily             -losartan resumed, still on lasix 40mg  daily  6/10 HR still ranges from mid 100's to 120. Sx are improving with diuresis however.  -Her outpatient cardiologist is Dr. Marko Plume at [facility] who specializes in Marfan Syndrome. Outpt appt  made  -cardiology consult requested but never came to see patient while here despite 2 requests   Vitals:   01/10/21 0351 01/10/21 0805  BP: (!) 102/59 102/66  Pulse: 66 (!) 107  Resp: 18 17  Temp: 98.9 F (37.2 C) 98.4 F (36.9 C)  SpO2: 98% 97%   10. Mild constipation               -  senna-s at  bedtime---moving bowels   -nausea resolved 11.  Mild hypoxia improved on O2, still sl sob at rest and with activity  -lasix  40mg  daily   -recent CXR stable  6/10   --consulted cardiology re HR/ volume status. Never saw   -advised pt to take second dose of lasix when she got home today and a second dose prn if she felt she was more sob. She shall f/u with outpt cardiology for further instruction   -  home on supplemental oxygen   -weight remains stable today   -potassium supplement, foods with potassium recommended Filed Weights   01/07/21 0630 01/09/21 0359 01/10/21 0351  Weight: 60.2 kg 60.7 kg 60.7 kg     LOS: 18 days A FACE TO FACE EVALUATION WAS PERFORMED  Meredith Staggers 01/10/2021, 10:21 AM

## 2021-01-10 NOTE — Progress Notes (Signed)
Patient ID: Jennifer [name], female   DOB: [birth year], 45 y.o.   MRN: [id number]  Update from [facility] w/c not in stock. Pt is amenable to leaving with larger size until 16" can be delivered to the home. *Update- [employer] (sister company in [place]) will deliver w/c to home. SW met with pt and pt husband to inform on changes, and provided contact information. All other DME delivered.   SW confirmed OPT PT/OT referral received, and efforts have been made to make contact with pt, but pt did not answer. Does not need d/c summary due to same EMR system.   Loralee Pacas, MSW, [place] Office: [phone] Cell: [phone] Fax: [phone]

## 2021-01-10 NOTE — Consult Note (Addendum)
Late entry Referring Physician: Alroy Dust Swartz/Rehab  Jennifer [name] is an 45 y.o. female.                       Chief Complaint: Shortness of breath and possible volume overload  HPI: 45 years old white female with Marfan syndrome, Prosthetic MV for MVP with endocarditis and severe MR on 10/31/2020. She had long stay at [facility] and rehab at [facility]. Her Chart was reviewed last week with rehab doctor on 1st day of consult. Her blood work, chest x-ray and physical exam were stable. Volume overload was not found. Patient was revaluated on 01/09/2021 in person. Again volume overload was not found. She has tolerated diltiazem low dose over q 6 hr, low dose metoprolol 3 times daily and average dose of amiodarone 200 mg. Daily with heart rate in 100 to 115 bpm range. She has RV systolic dysfunction, anemia and anxiety which also contributes to chronic tachycardia.  Post MV surgery she had cardiogenic shock, right sided pneumothorax and atrial fibrillation with RVR and needed care for long time under medical, pulmonary and myself at [facility]. She does better with controlled volume overload due to dilated RV. She also had TEE by me on 12/09/2020 showing mild LV systolic dysfunction, moderate to severe RV systolic dysfunction, functioning prosthetic MV with small perivalvular leak, PFO and severe Tricuspid valve regurgitation. Patient had just finished AM physical therapy and asked me to go over her medications.  Past Medical History:  Diagnosis Date   Marfan syndrome       Past Surgical History:  Procedure Laterality Date   BUBBLE STUDY  12/09/2020   Procedure: BUBBLE STUDY;  Surgeon: Dixie Dials, MD;  Location: [place];  Service: Cardiovascular;;   HERNIA REPAIR     IR THORACENTESIS ASP PLEURAL SPACE W/IMG GUIDE  11/27/2020   TEE WITHOUT CARDIOVERSION N/A 12/09/2020   Procedure: TRANSESOPHAGEAL ECHOCARDIOGRAM (TEE);  Surgeon: Dixie Dials, MD;  Location: [facility] ENDOSCOPY;   Service: Cardiovascular;  Laterality: N/A;    Family History  Problem Relation Age of Onset   Marfan syndrome Father    Hepatitis C Father    Social History:  reports that she has never smoked. She has never used smokeless tobacco. She reports current alcohol use. She reports that she does not use drugs.  Allergies: No Known Allergies  No medications prior to admission.    Results for orders placed or performed during the hospital encounter of 12/23/20 (from the past 48 hour(s))  Protime-INR     Status: Abnormal   Collection Time: 01/09/21  5:14 AM  Result Value Ref Range   Prothrombin Time 32.8 (H) 11.4 - 15.2 seconds   INR 3.2 (H) 0.8 - 1.2    Comment: (NOTE) INR goal varies based on device and disease states. Performed at [facility], [place] [address] [place], [place] [address]   Basic metabolic panel     Status: Abnormal   Collection Time: 01/09/21  5:14 AM  Result Value Ref Range   Sodium 140 135 - 145 mmol/L   Potassium 4.1 3.5 - 5.1 mmol/L   Chloride 92 (L) 98 - 111 mmol/L   CO2 44 (H) 22 - 32 mmol/L   Glucose, Bld 92 70 - 99 mg/dL    Comment: Glucose reference range applies only to samples taken after fasting for at least 8 hours.   BUN 14 6 - 20 mg/dL   Creatinine, Ser 0.49 0.44 -  1.00 mg/dL   Calcium 8.9 8.9 - 10.3 mg/dL   GFR, Estimated >60 >60 mL/min    Comment: (NOTE) Calculated using the CKD-EPI Creatinine Equation (2021)    Anion gap 4 (L) 5 - 15    Comment: Performed at [facility] [address], [place], [place] [address]  Protime-INR     Status: Abnormal   Collection Time: 01/10/21  5:26 AM  Result Value Ref Range   Prothrombin Time 35.5 (H) 11.4 - 15.2 seconds   INR 3.6 (H) 0.8 - 1.2    Comment: (NOTE) INR goal varies based on device and disease states. Performed at [facility], [place] [address] [place], [place] [address]    No results found.  Review Of Systems Constitutional: Positive fever, chills, chronic weight  loss. Eyes: No vision change, wears glasses. No discharge or pain. Ears: No hearing loss, No tinnitus. Respiratory: No asthma, COPD, positive pneumonias, shortness of breath. No hemoptysis. Cardiovascular: No chest pain, positive palpitation, leg edema. Gastrointestinal: No nausea, vomiting, diarrhea, constipation. No GI bleed. No hepatitis. Genitourinary: No dysuria, hematuria, kidney stone. No incontinance. Neurological: No headache, stroke, seizures.  Psychiatry: No psych facility admission for anxiety, depression, suicide. No detox. Skin: No rash. Musculoskeletal: No joint pain, fibromyalgia. Positive neck pain, back pain. Lymphadenopathy: No lymphadenopathy. Hematology: Positive anemia, No easy bruising.   Blood pressure 102/66, pulse (!) 107, temperature 98.4 F (36.9 C), temperature source Oral, resp. rate 17, height 5\' 10"  (1.778 m), weight 60.7 kg, SpO2 97 %. Body mass index is 19.2 kg/m. General appearance: alert, cooperative, appears stated age and mild respiratory distress Head: Normocephalic, atraumatic. Eyes: Blue eyes, Pale pink conjunctiva, corneas clear. PERRL, EOM's intact. Neck: No adenopathy, no carotid bruit, no JVD at 90 degree angle, supple, symmetrical, trachea midline and thyroid not enlarged. Resp: Clearing to auscultation bilaterally. Cardio: Irregular rate and rhythm, S1, S2 normal, II/VI systolic murmur, no click, rub or gallop GI: Soft, non-tender; bowel sounds normal; no organomegaly. Extremities: Trace to 1 + edema, no cyanosis or clubbing. Skin: Warm and dry.  Neurologic: Alert and oriented X 3, normal strength. Normal coordination and slow gait with walker/Wheelchair use.  Assessment/Plan Atrial fibrillation with moderate ventricular response, chronic S/P St. Jude mechanical valve S/P MV endocarditis/MVP/Severe MR Bilateral pleural effusion S/P chest tube for pneumothorax Chronic respiratory failure with hypoxia Blood loss anemia Marfan  syndrome Thoracic aortic aneurysm Mild LV systolic dysfunction Moderate to severe TR Chronic anticoagulation/Warfarin use  Plan: Continue current medications. Use diuretic as gingerly as needed to maintain good RV out put to improve LV preload. Discussed care with patient in person and by phone and she will follow with me one time in 10 days and then as needed. She also understood to cut amiodarone to 100 mg. Daily and use furosemide as needed only for increase in leg edema as she will do better with mild fluid overload due to RV dysfunction. Will inform Dr. Naaman Plummer about above changes in her care.  Time spent: Review of old records, Lab, x-rays, EKG, other cardiac tests, examination, discussion with patient over 70 minutes.  Birdie Riddle, MD  01/10/2021, 6:06 PM

## 2021-01-10 NOTE — Progress Notes (Signed)
ANTICOAGULATION CONSULT NOTE - Follow Up Consult  Pharmacy Consult for Coumadin Indication: St Jude MVR  No Known Allergies  Patient Measurements: Height: 5\' 10"  (177.8 cm) Weight: 60.7 kg (133 lb 13.1 oz) IBW/kg (Calculated) : 68.5  Vital Signs: Temp: 98.9 F (37.2 C) (06/10 0351) Temp Source: Oral (06/10 0351) BP: 102/59 (06/10 0351) Pulse Rate: 66 (06/10 0351)  Labs: Recent Labs    01/08/21 0546 01/09/21 0514 01/10/21 0526  LABPROT 31.5* 32.8* 35.5*  INR 3.1* 3.2* 3.6*  CREATININE  --  0.49  --      Estimated Creatinine Clearance: 86.9 mL/min (by C-G formula based on SCr of 0.49 mg/dL).   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 Goal INR 2.5-3.5.  CBC qMon: Hgb 9.4 relatively stable. Plts stable 260s  - INR 3.1>3.2>3.6  INR slightly above goal, will give lower dose today. If discharged today, please d/c on warfarin 5mg  daily except 2.5mg  on Fridays and Mondays.   Goal of Therapy:  INR 2.5-3.5 Monitor platelets by anticoagulation protocol: Yes   Plan:  Coumadin 2.5mg  PO x1 Daily INR, qMon CBC  Levaeh Vice A. Levada Dy, PharmD, BCPS, FNKF Clinical Pharmacist [place] Please utilize Amion for appropriate phone number to reach the unit pharmacist ([place])   01/10/2021,7:43 AM

## 2021-01-10 NOTE — Progress Notes (Signed)
Inpatient Rehabilitation Care Coordinator Discharge Note  The overall goal for the admission was met for:   Discharge location: Yes. D/c to home with support from husband and sons.  Length of Stay: Yes. 17 days.   Discharge activity level: Yes. Supervision.  Home/community participation: Yes. Limited.   Services provided included: MD, RD, PT, OT, RN, CM, TR, Pharmacy, Neuropsych, and SW  Financial Services: Private Insurance: [employer] [facility]  Choices offered to/list presented to:Yes  Follow-up services arranged: Outpatient PT/OT at [facility]; DME: [facility] for w/c (to be delivered to home), 3in1 BSC, TTB, and home o2.  Comments (or additional information):  Patient/Family verbalized understanding of follow-up arrangements: Yes  Individual responsible for coordination of the follow-up plan: contact pt [phone] or husband Micheal#[phone]  Confirmed correct DME delivered: Rana Snare 01/10/2021    Rana Snare

## 2021-01-10 NOTE — Progress Notes (Signed)
Recreational Therapy Discharge Summary Patient Details  Name: Jennifer [name] MRN: [id number] Date of Birth: 1975-08-23 Today's Date: 01/10/2021  Long term goals set: 2  Long term goals met: 2  Comments on progress toward goals: Pt has made excellent progress during LOS and is excited about discharge home with family.  TR sessions focused on activity tolerance, coping strategies, & community reintegration.  Pt is discharging home at contact guard assist level for standing TR tasks and contact guard -min assist for community reintegration.  Pt requires additional assistance at times performing sit->stands from low surface heights.  Education provided on coping strategies, energy conservation techniques.  Reasons for discharge: discharge from hospital  Patient/family agrees with progress made and goals achieved: Yes  Mertis Mosher 01/10/2021, 8:37 AM

## 2021-01-13 ENCOUNTER — Encounter: Payer: Self-pay | Admitting: *Deleted

## 2021-01-13 ENCOUNTER — Other Ambulatory Visit: Payer: Self-pay | Admitting: *Deleted

## 2021-01-13 DIAGNOSIS — R5381 Other malaise: Secondary | ICD-10-CM | POA: Diagnosis not present

## 2021-01-13 DIAGNOSIS — Z952 Presence of prosthetic heart valve: Secondary | ICD-10-CM

## 2021-01-13 NOTE — Patient Outreach (Signed)
Jennifer Jennifer  01/13/2021  Jennifer Jennifer 09-Dec-1975 [id number]   Transition of care call/case closure   Referral received: 01/10/21 Initial outreach: 01/13/21 Insurance: [place] [employer]MR    Subjective: Initial successful telephone call to patient's preferred number in order to complete transition of care assessment; 2 HIPAA identifiers verified. Explained purpose of call and completed transition of care assessment.  Jennifer Jennifer states that she is doing pretty good states she realizes she will have to work to get her strength back to rebuild. She reports incisional areas look good healing. She discussed wearing oxygen at 2 liters, she reports her weight at home had been 130-133 since discharge and today's weight was 138, she denied increased shortness of breath, but she did take lasix as recommended as needed basis, she reports oxygen saturation has been in the 93-97% range.  She continues to monitor blood pressure recent readings 100-110/78 and keeping a log.  She  states surgical pain well managed with prescribed medications, She reports tolerating diet, denies bowel or bladder problems.  Spouse/children are assisting with her recovery.   Patient states that she has spoken with outpatient physical and occupational therapy and initial appointments have been arranged.  Patient discussed understanding of need of INR check with results to Jennifer Jennifer. Her understanding was [facility] RN per discharge AVS would draw INR and sent results to Jennifer Jennifer for Jennifer Jennifer. No noted HH arrangements  per inpatient notes, OP therapy referrals in place. Patient agreeable for follow up at Jennifer Jennifer office for INR if needed. Attempt secure chat to Rehab social worker arranging discharge , unavailable . Returned call to patient,and updated, she discussed possibly being able to get INR checked at clinic in [facility] area, she also discussed that it is her understanding that Jennifer Jennifer plans to refer  her to a general cardiologist she voiced understanding of sooner follow up for Jennifer Jennifer scheduling.  Placed call to Jennifer Jennifer office  nurse line able to speak with Nurse  to explain patient need for INR check on 6/14 with results  to Jennifer Jennifer per her discharge instructions for Jennifer Jennifer . Patient has outpatient therapy arranged no home health for labs draws, explained patien tis agreeable to lab appointment in [facility] area with MD recommends. Nurse explains Jennifer Jennifer office is Cardiology genetics, not general cardiology she states she will forward information regarding patient need.  Reviewed accessing the following [place] [employer] :  Patient is unsure if she has  the hospital indemnity plan,provided contact number to [employer] and [place] benefits office to follow up.   Objective:  Jennifer Jennifer  was hospitalized at [facility] inpatient Rehab 5/23-6/10/22 for Debility, atrial fib. She was admitted at [facility] 3/19-4/24/22 for Endocarditiis , s/p Mitral valve replacment , chest wall reconstruction and chest closure . She was transferred to [facility] 4/24-5/23/22. Past Medical History: Marfan sydndrome, pectus excavatum, mitral valve prolapse, end She was discharged to home on 01/10/21  with outpatient physical therapy and occupational therapy, DME equipment included wheelchair, 3in1, transfer tub bench, home oxygen at 2 liters as needed with exertion.  Assessment:  Patient voices good understanding of all discharge instructions.  See transition of care flowsheet for assessment details.   Plan:  Reviewed hospital discharge diagnosis of  Mitral valve prolapse  and discharge treatment plan using hospital discharge instructions, assessing medication adherence, reviewing problems requiring provider notification, and discussing the importance of follow  up with surgeon, primary care provider and/or specialists as directed.  Reviewed [place]  healthy lifestyle program information to receive discounted premium for  2023   Step 1: Get  your annual physical  Step 2: Complete your health assessment  Step 3:Identify your current health status and complete the corresponding action step between August 03, 2020 and April 03, 2021.    Patient agreeable to return call in the business day for follow up regarding INR Jennifer.  Will route successful outreach letter with [place] Jennifer pamphlet and 24 Hour Nurse Line Magnet to [place] Jennifer clinical pool to be mailed to patient's home address.  Thanked patient for their services to [facility].  Jennifer Draft, RN, BSN  [place] Jennifer Coordinator  [phone]- Mobile [phone]- Toll Free Main Office

## 2021-01-14 ENCOUNTER — Other Ambulatory Visit: Payer: Self-pay | Admitting: *Deleted

## 2021-01-14 NOTE — Patient Outreach (Signed)
[facility]) Care Management  01/14/2021  Jennifer [name] October 12, 1975 [id number]   Care Coordination    Subjective  Successful outreach call to patient on follow up from [facility] office regarding INR check and follow up. Patient states that she has not received a call yet. Explained to her call placed to office on yesterday. Will return call to office on today and return call to patient.   Placed call to Dr. Marko Plume office spoke with representative Minette Brine explain patient need for INR lab to be checked for management of coumadin after mitral valve replacement. Dr.LISI cardiologist following patient prior to admission and noted plans for transition to general cardiology. Discussed with representative patient will need an INR drawn sooner for adjustment of coumadin by cardiology, recommended date for recheck at discharge was for 6/14.  Representative states she will follow up with clinical team, provided my contact number for return call.    Plan Will update patient with further communication when received.   Joylene Draft, RN, BSN  [place] Management Coordinator  [phone]- Mobile [phone]- Toll Free Main Office

## 2021-01-15 ENCOUNTER — Other Ambulatory Visit: Payer: Self-pay | Admitting: *Deleted

## 2021-01-15 NOTE — Patient Outreach (Signed)
[facility]) Care Management  01/15/2021  KALAYNA NOY 06-Apr-1976 [id number]  Care Coordination     Subjective    Outreach call to Dr. Gerrianne Scale office regarding follow up on patient need for INR and coumadin management,as Dr. Marko Plume listed as her cardiology at time of discharge, in speaking with nurse regarding , she reviewed plan for referral to general cardiology as Dr. Marko Plume is  Cardiac genetic, she is states she will send message forward regarding plan.   Placed call to patient to update regarding call to office on today. Noted in care everywhere referral to general cardiology has been made. Patient states she noted also I assisted her with placing a call to [facility] Cardiology, explained need for sooner visit for INR monitoring and coumadin management she states that patient is under Dr.LISI care until initial appointment with Cardiology. This 3way call with patient on line she was able to make 1st available appointment with Dr. Talbert Cage in August 12.   Placed return call to Dr. Marko Plume office spoke with Nurse Raquel Sarna that emphasized Dr. Marko Plume is Cardiac Genetic speciality and does not manage coumadin. She states recommendation is refer to patient PCP for INR check and coumadin management until established with cardiology. Verbalized understanding.  Placed returned call to patient to communicate above information , placed 3 way call to PCP , Dr. Vista Lawman office explained need for patient appointment for INR and coumadin management recommended for 6/14 post discharge. Reviewed outreach calls to Dr. Marko Plume office and recommendation for PCP for coumadin management until patient with general cardiology,  representative will send urgent message to Dr. Vista Lawman and follow up with patient for scheduling as agreed.   Plan Will plan return call to patient in the next business day.    Joylene Draft, RN, BSN  [place] Management Coordinator  [phone]-  Mobile [phone]- Toll Free Main Office

## 2021-01-16 ENCOUNTER — Other Ambulatory Visit: Payer: Self-pay | Admitting: *Deleted

## 2021-01-16 DIAGNOSIS — Z952 Presence of prosthetic heart valve: Secondary | ICD-10-CM | POA: Diagnosis not present

## 2021-01-16 NOTE — Patient Outreach (Signed)
[facility]) Care Management  01/16/2021  Jennifer [name] 1975-09-24 [id number]   Care Coordination    Referral received: 01/10/21 Initial outreach: 01/13/21 Insurance: [place] [employer]MR     Successful follow up call to patient she verifies having lab visit for INR at PCP office on today, for management of coumadin dose.    Plan Will schedule transition of care follow up call in the next week, patient agreeable .   Joylene Draft, RN, BSN  [place] Management Coordinator  [phone]- Mobile [phone]- Toll Free Main Office

## 2021-01-20 DIAGNOSIS — I340 Nonrheumatic mitral (valve) insufficiency: Secondary | ICD-10-CM | POA: Diagnosis not present

## 2021-01-20 DIAGNOSIS — Z7982 Long term (current) use of aspirin: Secondary | ICD-10-CM | POA: Diagnosis not present

## 2021-01-20 DIAGNOSIS — Z79899 Other long term (current) drug therapy: Secondary | ICD-10-CM | POA: Diagnosis not present

## 2021-01-20 DIAGNOSIS — Q87410 Marfan's syndrome with aortic dilation: Secondary | ICD-10-CM | POA: Diagnosis not present

## 2021-01-20 DIAGNOSIS — Z5181 Encounter for therapeutic drug level monitoring: Secondary | ICD-10-CM | POA: Diagnosis not present

## 2021-01-20 DIAGNOSIS — Z952 Presence of prosthetic heart valve: Secondary | ICD-10-CM | POA: Diagnosis not present

## 2021-01-20 DIAGNOSIS — R000 Tachycardia, unspecified: Secondary | ICD-10-CM | POA: Diagnosis not present

## 2021-01-20 DIAGNOSIS — I480 Paroxysmal atrial fibrillation: Secondary | ICD-10-CM | POA: Diagnosis not present

## 2021-01-20 DIAGNOSIS — Q8740 Marfan's syndrome, unspecified: Secondary | ICD-10-CM | POA: Diagnosis not present

## 2021-01-20 DIAGNOSIS — Z79891 Long term (current) use of opiate analgesic: Secondary | ICD-10-CM | POA: Diagnosis not present

## 2021-01-20 DIAGNOSIS — R791 Abnormal coagulation profile: Secondary | ICD-10-CM | POA: Diagnosis not present

## 2021-01-20 DIAGNOSIS — Z7901 Long term (current) use of anticoagulants: Secondary | ICD-10-CM | POA: Diagnosis not present

## 2021-01-21 ENCOUNTER — Other Ambulatory Visit (HOSPITAL_COMMUNITY): Payer: Self-pay

## 2021-01-21 MED ORDER — ENOXAPARIN SODIUM 60 MG/0.6ML IJ SOSY
PREFILLED_SYRINGE | INTRAMUSCULAR | 0 refills | Status: DC
  Filled 2021-01-21: qty 6, 5d supply, fill #0

## 2021-01-22 ENCOUNTER — Other Ambulatory Visit (HOSPITAL_COMMUNITY): Payer: Self-pay

## 2021-01-22 DIAGNOSIS — I471 Supraventricular tachycardia: Secondary | ICD-10-CM | POA: Diagnosis not present

## 2021-01-22 DIAGNOSIS — I340 Nonrheumatic mitral (valve) insufficiency: Secondary | ICD-10-CM | POA: Diagnosis not present

## 2021-01-22 DIAGNOSIS — Z7901 Long term (current) use of anticoagulants: Secondary | ICD-10-CM | POA: Diagnosis not present

## 2021-01-22 DIAGNOSIS — I519 Heart disease, unspecified: Secondary | ICD-10-CM | POA: Diagnosis not present

## 2021-01-22 DIAGNOSIS — Z9889 Other specified postprocedural states: Secondary | ICD-10-CM | POA: Diagnosis not present

## 2021-01-22 DIAGNOSIS — I77810 Thoracic aortic ectasia: Secondary | ICD-10-CM | POA: Diagnosis not present

## 2021-01-22 DIAGNOSIS — Z79891 Long term (current) use of opiate analgesic: Secondary | ICD-10-CM | POA: Diagnosis not present

## 2021-01-22 DIAGNOSIS — I38 Endocarditis, valve unspecified: Secondary | ICD-10-CM | POA: Diagnosis not present

## 2021-01-22 DIAGNOSIS — I9789 Other postprocedural complications and disorders of the circulatory system, not elsewhere classified: Secondary | ICD-10-CM | POA: Diagnosis not present

## 2021-01-22 DIAGNOSIS — Z7982 Long term (current) use of aspirin: Secondary | ICD-10-CM | POA: Diagnosis not present

## 2021-01-22 DIAGNOSIS — R791 Abnormal coagulation profile: Secondary | ICD-10-CM | POA: Diagnosis not present

## 2021-01-22 DIAGNOSIS — Q8740 Marfan's syndrome, unspecified: Secondary | ICD-10-CM | POA: Diagnosis not present

## 2021-01-22 DIAGNOSIS — I4891 Unspecified atrial fibrillation: Secondary | ICD-10-CM | POA: Diagnosis not present

## 2021-01-22 DIAGNOSIS — Z79899 Other long term (current) drug therapy: Secondary | ICD-10-CM | POA: Diagnosis not present

## 2021-01-22 DIAGNOSIS — Z952 Presence of prosthetic heart valve: Secondary | ICD-10-CM | POA: Diagnosis not present

## 2021-01-22 DIAGNOSIS — Z5181 Encounter for therapeutic drug level monitoring: Secondary | ICD-10-CM | POA: Diagnosis not present

## 2021-01-22 DIAGNOSIS — Q87410 Marfan's syndrome with aortic dilation: Secondary | ICD-10-CM | POA: Diagnosis not present

## 2021-01-22 MED ORDER — WARFARIN SODIUM 5 MG PO TABS
ORAL_TABLET | ORAL | 1 refills | Status: AC
  Filled 2021-01-22: qty 90, 90d supply, fill #0

## 2021-01-23 ENCOUNTER — Other Ambulatory Visit: Payer: Self-pay | Admitting: *Deleted

## 2021-01-23 NOTE — Patient Outreach (Signed)
[facility]) Care Management  01/23/2021  TAMYKA BEZIO [birth year] [id number]    Transition of care followup    Referral received: 01/10/21 Initial outreach: 01/13/21 Insurance: [place] [employer]MR   Subjective: Successful follow up call to patient she reports doing good. She discussed she is now followed in coumadin clinic through [facility].  She had recent visit with Dr. Marko Plume with plans for referral to Heart failure clinic for ongoing care. She has upcoming appointment with cardiac surgeon.  She reports still wearing oxygen at 1.5 liters at home, tolerating mobility with use of rolling walker at needed, she to began outpatient physical therapy july7.  She continues to monitor weights daily, no sudden weight gain or need to use prn lasix as ordered. She reports that her appetite is back to usual.   Discussed Active Management chronic condition management program benefit of [place] wellness and she is agreeable to referral.  Micheal denies any other concerns or need for additional education or resource information .   Objective: Mrs. Teale Broder  was hospitalized at [facility] inpatient Rehab 5/23-6/10/22 for Debility, atrial fib. She was admitted at [facility] 3/19-4/24/22 for Endocarditiis , s/p Mitral valve replacment , chest wall reconstruction and chest closure . She was transferred to [facility] 4/24-5/23/22. Past Medical History: Marfan sydndrome, pectus excavatum, mitral valve prolapse, end She was discharged to home on 01/10/21  with outpatient physical therapy and occupational therapy, DME equipment included wheelchair, 3in1, transfer tub bench, home oxygen at 2 liters as needed with exertion.  Plan  Will plan follow up call in the next 2 weeks, will assess for ongoing care management needs prior to case closure.    Joylene Draft, RN, BSN  [place] Management Coordinator  [phone]- Mobile [phone]-  Toll Free Main Office

## 2021-01-27 DIAGNOSIS — Z5181 Encounter for therapeutic drug level monitoring: Secondary | ICD-10-CM | POA: Diagnosis not present

## 2021-01-27 DIAGNOSIS — R791 Abnormal coagulation profile: Secondary | ICD-10-CM | POA: Diagnosis not present

## 2021-01-27 DIAGNOSIS — Q8740 Marfan's syndrome, unspecified: Secondary | ICD-10-CM | POA: Diagnosis not present

## 2021-01-27 DIAGNOSIS — Z952 Presence of prosthetic heart valve: Secondary | ICD-10-CM | POA: Diagnosis not present

## 2021-01-27 DIAGNOSIS — Z7901 Long term (current) use of anticoagulants: Secondary | ICD-10-CM | POA: Diagnosis not present

## 2021-01-29 DIAGNOSIS — J939 Pneumothorax, unspecified: Secondary | ICD-10-CM | POA: Diagnosis not present

## 2021-01-29 DIAGNOSIS — J9811 Atelectasis: Secondary | ICD-10-CM | POA: Diagnosis not present

## 2021-01-29 DIAGNOSIS — J95811 Postprocedural pneumothorax: Secondary | ICD-10-CM | POA: Diagnosis not present

## 2021-01-29 DIAGNOSIS — Z48812 Encounter for surgical aftercare following surgery on the circulatory system: Secondary | ICD-10-CM | POA: Diagnosis not present

## 2021-01-29 DIAGNOSIS — J90 Pleural effusion, not elsewhere classified: Secondary | ICD-10-CM | POA: Diagnosis not present

## 2021-01-29 DIAGNOSIS — Z79899 Other long term (current) drug therapy: Secondary | ICD-10-CM | POA: Diagnosis not present

## 2021-01-29 DIAGNOSIS — Z4789 Encounter for other orthopedic aftercare: Secondary | ICD-10-CM | POA: Diagnosis not present

## 2021-01-29 DIAGNOSIS — Z952 Presence of prosthetic heart valve: Secondary | ICD-10-CM | POA: Diagnosis not present

## 2021-01-30 DIAGNOSIS — Q8740 Marfan's syndrome, unspecified: Secondary | ICD-10-CM | POA: Diagnosis not present

## 2021-01-30 DIAGNOSIS — Z7901 Long term (current) use of anticoagulants: Secondary | ICD-10-CM | POA: Diagnosis not present

## 2021-01-30 DIAGNOSIS — R161 Splenomegaly, not elsewhere classified: Secondary | ICD-10-CM | POA: Diagnosis not present

## 2021-01-31 DIAGNOSIS — I471 Supraventricular tachycardia: Secondary | ICD-10-CM | POA: Diagnosis not present

## 2021-02-04 DIAGNOSIS — Q8740 Marfan's syndrome, unspecified: Secondary | ICD-10-CM | POA: Diagnosis not present

## 2021-02-04 DIAGNOSIS — Z7901 Long term (current) use of anticoagulants: Secondary | ICD-10-CM | POA: Diagnosis not present

## 2021-02-04 DIAGNOSIS — R791 Abnormal coagulation profile: Secondary | ICD-10-CM | POA: Diagnosis not present

## 2021-02-04 DIAGNOSIS — Z952 Presence of prosthetic heart valve: Secondary | ICD-10-CM | POA: Diagnosis not present

## 2021-02-04 DIAGNOSIS — Z5181 Encounter for therapeutic drug level monitoring: Secondary | ICD-10-CM | POA: Diagnosis not present

## 2021-02-06 ENCOUNTER — Other Ambulatory Visit (HOSPITAL_COMMUNITY): Payer: Self-pay | Admitting: Physician Assistant

## 2021-02-06 DIAGNOSIS — Z736 Limitation of activities due to disability: Secondary | ICD-10-CM | POA: Diagnosis not present

## 2021-02-06 DIAGNOSIS — Q8740 Marfan's syndrome, unspecified: Secondary | ICD-10-CM | POA: Diagnosis not present

## 2021-02-06 DIAGNOSIS — R29898 Other symptoms and signs involving the musculoskeletal system: Secondary | ICD-10-CM | POA: Diagnosis not present

## 2021-02-06 DIAGNOSIS — Z7409 Other reduced mobility: Secondary | ICD-10-CM | POA: Diagnosis not present

## 2021-02-06 DIAGNOSIS — R279 Unspecified lack of coordination: Secondary | ICD-10-CM | POA: Diagnosis not present

## 2021-02-06 DIAGNOSIS — R5381 Other malaise: Secondary | ICD-10-CM | POA: Diagnosis not present

## 2021-02-06 DIAGNOSIS — R6889 Other general symptoms and signs: Secondary | ICD-10-CM | POA: Diagnosis not present

## 2021-02-06 DIAGNOSIS — R531 Weakness: Secondary | ICD-10-CM | POA: Diagnosis not present

## 2021-02-07 ENCOUNTER — Other Ambulatory Visit: Payer: Self-pay | Admitting: *Deleted

## 2021-02-07 DIAGNOSIS — Z7901 Long term (current) use of anticoagulants: Secondary | ICD-10-CM | POA: Diagnosis not present

## 2021-02-07 DIAGNOSIS — Z5181 Encounter for therapeutic drug level monitoring: Secondary | ICD-10-CM | POA: Diagnosis not present

## 2021-02-07 DIAGNOSIS — Q8740 Marfan's syndrome, unspecified: Secondary | ICD-10-CM | POA: Diagnosis not present

## 2021-02-07 DIAGNOSIS — Z952 Presence of prosthetic heart valve: Secondary | ICD-10-CM | POA: Diagnosis not present

## 2021-02-07 NOTE — Patient Outreach (Signed)
[facility]) Care Management  02/07/2021  Jennifer [name] August 21, 1975 [id number]   Transition of care followup /Case closure    Referral received: 01/10/21 Initial outreach: 01/13/21 Insurance: [place]; Successful follow up call to patient she discussed doing good, a little tires on today, after attending initial outpatient physical therapy apppointment on yesterday.  Patient reports continuing to wear oxygen at 2 liters, and she discussed plan for follow up with PCP regarding weaning or other referrals. She reports attending visit with cardiac surgeon , and upcoming appointment with general cardiologis in next month.  She discussed her weight being stable and only having to take lasix as needed.  She reports that her INR level is getting stablized with coumadin dose, had follow up INR on today.  Patient denies any other concerns at this time, agrees to case closure.   Plan Will close to [facility] care management , patient has established with Active health management health coach for disease management of chronic conditions.    Joylene Draft, RN, BSN  [place] Management Coordinator  [phone]- Mobile [phone]- Toll Free Main Office

## 2021-02-09 DIAGNOSIS — R5381 Other malaise: Secondary | ICD-10-CM | POA: Diagnosis not present

## 2021-02-09 DIAGNOSIS — J9611 Chronic respiratory failure with hypoxia: Secondary | ICD-10-CM | POA: Diagnosis not present

## 2021-02-10 ENCOUNTER — Other Ambulatory Visit (HOSPITAL_COMMUNITY): Payer: Self-pay

## 2021-02-10 DIAGNOSIS — R5381 Other malaise: Secondary | ICD-10-CM | POA: Diagnosis not present

## 2021-02-10 DIAGNOSIS — Q8740 Marfan's syndrome, unspecified: Secondary | ICD-10-CM | POA: Diagnosis not present

## 2021-02-10 DIAGNOSIS — R531 Weakness: Secondary | ICD-10-CM | POA: Diagnosis not present

## 2021-02-10 DIAGNOSIS — Z7409 Other reduced mobility: Secondary | ICD-10-CM | POA: Diagnosis not present

## 2021-02-10 DIAGNOSIS — R29898 Other symptoms and signs involving the musculoskeletal system: Secondary | ICD-10-CM | POA: Diagnosis not present

## 2021-02-10 DIAGNOSIS — R279 Unspecified lack of coordination: Secondary | ICD-10-CM | POA: Diagnosis not present

## 2021-02-10 DIAGNOSIS — R6889 Other general symptoms and signs: Secondary | ICD-10-CM | POA: Diagnosis not present

## 2021-02-10 DIAGNOSIS — Z736 Limitation of activities due to disability: Secondary | ICD-10-CM | POA: Diagnosis not present

## 2021-02-12 DIAGNOSIS — R5381 Other malaise: Secondary | ICD-10-CM | POA: Diagnosis not present

## 2021-02-13 DIAGNOSIS — R29898 Other symptoms and signs involving the musculoskeletal system: Secondary | ICD-10-CM | POA: Diagnosis not present

## 2021-02-13 DIAGNOSIS — Z7409 Other reduced mobility: Secondary | ICD-10-CM | POA: Diagnosis not present

## 2021-02-13 DIAGNOSIS — R279 Unspecified lack of coordination: Secondary | ICD-10-CM | POA: Diagnosis not present

## 2021-02-13 DIAGNOSIS — R5381 Other malaise: Secondary | ICD-10-CM | POA: Diagnosis not present

## 2021-02-13 DIAGNOSIS — Q8740 Marfan's syndrome, unspecified: Secondary | ICD-10-CM | POA: Diagnosis not present

## 2021-02-13 DIAGNOSIS — R6889 Other general symptoms and signs: Secondary | ICD-10-CM | POA: Diagnosis not present

## 2021-02-13 DIAGNOSIS — R531 Weakness: Secondary | ICD-10-CM | POA: Diagnosis not present

## 2021-02-13 DIAGNOSIS — Z736 Limitation of activities due to disability: Secondary | ICD-10-CM | POA: Diagnosis not present

## 2021-02-17 DIAGNOSIS — R29898 Other symptoms and signs involving the musculoskeletal system: Secondary | ICD-10-CM | POA: Diagnosis not present

## 2021-02-17 DIAGNOSIS — R5381 Other malaise: Secondary | ICD-10-CM | POA: Diagnosis not present

## 2021-02-17 DIAGNOSIS — R279 Unspecified lack of coordination: Secondary | ICD-10-CM | POA: Diagnosis not present

## 2021-02-17 DIAGNOSIS — Q8740 Marfan's syndrome, unspecified: Secondary | ICD-10-CM | POA: Diagnosis not present

## 2021-02-17 DIAGNOSIS — R6889 Other general symptoms and signs: Secondary | ICD-10-CM | POA: Diagnosis not present

## 2021-02-17 DIAGNOSIS — Z736 Limitation of activities due to disability: Secondary | ICD-10-CM | POA: Diagnosis not present

## 2021-02-17 DIAGNOSIS — Z7409 Other reduced mobility: Secondary | ICD-10-CM | POA: Diagnosis not present

## 2021-02-17 DIAGNOSIS — R531 Weakness: Secondary | ICD-10-CM | POA: Diagnosis not present

## 2021-02-20 DIAGNOSIS — Q8740 Marfan's syndrome, unspecified: Secondary | ICD-10-CM | POA: Diagnosis not present

## 2021-02-20 DIAGNOSIS — R29898 Other symptoms and signs involving the musculoskeletal system: Secondary | ICD-10-CM | POA: Diagnosis not present

## 2021-02-20 DIAGNOSIS — R531 Weakness: Secondary | ICD-10-CM | POA: Diagnosis not present

## 2021-02-20 DIAGNOSIS — Z736 Limitation of activities due to disability: Secondary | ICD-10-CM | POA: Diagnosis not present

## 2021-02-20 DIAGNOSIS — Z7409 Other reduced mobility: Secondary | ICD-10-CM | POA: Diagnosis not present

## 2021-02-20 DIAGNOSIS — R279 Unspecified lack of coordination: Secondary | ICD-10-CM | POA: Diagnosis not present

## 2021-02-20 DIAGNOSIS — R5381 Other malaise: Secondary | ICD-10-CM | POA: Diagnosis not present

## 2021-02-20 DIAGNOSIS — R6889 Other general symptoms and signs: Secondary | ICD-10-CM | POA: Diagnosis not present

## 2021-02-21 DIAGNOSIS — I4892 Unspecified atrial flutter: Secondary | ICD-10-CM | POA: Diagnosis not present

## 2021-02-21 DIAGNOSIS — I471 Supraventricular tachycardia: Secondary | ICD-10-CM | POA: Diagnosis not present

## 2021-02-21 DIAGNOSIS — I4510 Unspecified right bundle-branch block: Secondary | ICD-10-CM | POA: Diagnosis not present

## 2021-02-21 DIAGNOSIS — I472 Ventricular tachycardia: Secondary | ICD-10-CM | POA: Diagnosis not present

## 2021-02-21 DIAGNOSIS — R002 Palpitations: Secondary | ICD-10-CM | POA: Diagnosis not present

## 2021-02-21 DIAGNOSIS — I4430 Unspecified atrioventricular block: Secondary | ICD-10-CM | POA: Diagnosis not present

## 2021-02-21 DIAGNOSIS — I517 Cardiomegaly: Secondary | ICD-10-CM | POA: Diagnosis not present

## 2021-02-24 DIAGNOSIS — R279 Unspecified lack of coordination: Secondary | ICD-10-CM | POA: Diagnosis not present

## 2021-02-24 DIAGNOSIS — Z7409 Other reduced mobility: Secondary | ICD-10-CM | POA: Diagnosis not present

## 2021-02-24 DIAGNOSIS — R6889 Other general symptoms and signs: Secondary | ICD-10-CM | POA: Diagnosis not present

## 2021-02-24 DIAGNOSIS — R531 Weakness: Secondary | ICD-10-CM | POA: Diagnosis not present

## 2021-02-24 DIAGNOSIS — R29898 Other symptoms and signs involving the musculoskeletal system: Secondary | ICD-10-CM | POA: Diagnosis not present

## 2021-02-24 DIAGNOSIS — R5381 Other malaise: Secondary | ICD-10-CM | POA: Diagnosis not present

## 2021-02-24 DIAGNOSIS — Q8740 Marfan's syndrome, unspecified: Secondary | ICD-10-CM | POA: Diagnosis not present

## 2021-02-24 DIAGNOSIS — Z736 Limitation of activities due to disability: Secondary | ICD-10-CM | POA: Diagnosis not present

## 2021-02-25 DIAGNOSIS — Z7901 Long term (current) use of anticoagulants: Secondary | ICD-10-CM | POA: Diagnosis not present

## 2021-02-25 DIAGNOSIS — Z5181 Encounter for therapeutic drug level monitoring: Secondary | ICD-10-CM | POA: Diagnosis not present

## 2021-02-25 DIAGNOSIS — Q8740 Marfan's syndrome, unspecified: Secondary | ICD-10-CM | POA: Diagnosis not present

## 2021-02-25 DIAGNOSIS — R791 Abnormal coagulation profile: Secondary | ICD-10-CM | POA: Diagnosis not present

## 2021-02-25 DIAGNOSIS — Z952 Presence of prosthetic heart valve: Secondary | ICD-10-CM | POA: Diagnosis not present

## 2021-02-27 DIAGNOSIS — Z7409 Other reduced mobility: Secondary | ICD-10-CM | POA: Diagnosis not present

## 2021-02-27 DIAGNOSIS — R6889 Other general symptoms and signs: Secondary | ICD-10-CM | POA: Diagnosis not present

## 2021-02-27 DIAGNOSIS — R279 Unspecified lack of coordination: Secondary | ICD-10-CM | POA: Diagnosis not present

## 2021-02-27 DIAGNOSIS — Q8740 Marfan's syndrome, unspecified: Secondary | ICD-10-CM | POA: Diagnosis not present

## 2021-02-27 DIAGNOSIS — R5381 Other malaise: Secondary | ICD-10-CM | POA: Diagnosis not present

## 2021-02-27 DIAGNOSIS — R29898 Other symptoms and signs involving the musculoskeletal system: Secondary | ICD-10-CM | POA: Diagnosis not present

## 2021-02-27 DIAGNOSIS — Z736 Limitation of activities due to disability: Secondary | ICD-10-CM | POA: Diagnosis not present

## 2021-02-27 DIAGNOSIS — R531 Weakness: Secondary | ICD-10-CM | POA: Diagnosis not present

## 2021-02-28 DIAGNOSIS — I4510 Unspecified right bundle-branch block: Secondary | ICD-10-CM | POA: Diagnosis not present

## 2021-02-28 DIAGNOSIS — I4892 Unspecified atrial flutter: Secondary | ICD-10-CM | POA: Diagnosis not present

## 2021-02-28 DIAGNOSIS — I4430 Unspecified atrioventricular block: Secondary | ICD-10-CM | POA: Diagnosis not present

## 2021-02-28 DIAGNOSIS — I517 Cardiomegaly: Secondary | ICD-10-CM | POA: Diagnosis not present

## 2021-03-03 DIAGNOSIS — R6889 Other general symptoms and signs: Secondary | ICD-10-CM | POA: Diagnosis not present

## 2021-03-03 DIAGNOSIS — R5381 Other malaise: Secondary | ICD-10-CM | POA: Diagnosis not present

## 2021-03-03 DIAGNOSIS — Q8740 Marfan's syndrome, unspecified: Secondary | ICD-10-CM | POA: Diagnosis not present

## 2021-03-03 DIAGNOSIS — R279 Unspecified lack of coordination: Secondary | ICD-10-CM | POA: Diagnosis not present

## 2021-03-03 DIAGNOSIS — R29898 Other symptoms and signs involving the musculoskeletal system: Secondary | ICD-10-CM | POA: Diagnosis not present

## 2021-03-03 DIAGNOSIS — Z789 Other specified health status: Secondary | ICD-10-CM | POA: Diagnosis not present

## 2021-03-03 DIAGNOSIS — Z7409 Other reduced mobility: Secondary | ICD-10-CM | POA: Diagnosis not present

## 2021-03-03 DIAGNOSIS — R531 Weakness: Secondary | ICD-10-CM | POA: Diagnosis not present

## 2021-03-04 ENCOUNTER — Other Ambulatory Visit (HOSPITAL_COMMUNITY): Payer: Self-pay

## 2021-03-04 ENCOUNTER — Other Ambulatory Visit (HOSPITAL_COMMUNITY): Payer: Self-pay | Admitting: Physician Assistant

## 2021-03-05 ENCOUNTER — Other Ambulatory Visit (HOSPITAL_COMMUNITY): Payer: Self-pay

## 2021-03-05 DIAGNOSIS — R791 Abnormal coagulation profile: Secondary | ICD-10-CM | POA: Diagnosis not present

## 2021-03-05 DIAGNOSIS — Z7901 Long term (current) use of anticoagulants: Secondary | ICD-10-CM | POA: Diagnosis not present

## 2021-03-05 DIAGNOSIS — Q8740 Marfan's syndrome, unspecified: Secondary | ICD-10-CM | POA: Diagnosis not present

## 2021-03-05 DIAGNOSIS — Z5181 Encounter for therapeutic drug level monitoring: Secondary | ICD-10-CM | POA: Diagnosis not present

## 2021-03-05 DIAGNOSIS — Z952 Presence of prosthetic heart valve: Secondary | ICD-10-CM | POA: Diagnosis not present

## 2021-03-05 MED ORDER — AMIODARONE HCL 100 MG PO TABS
100.0000 mg | ORAL_TABLET | Freq: Every day | ORAL | 2 refills | Status: AC
  Filled 2021-03-05: qty 90, 90d supply, fill #0
  Filled 2021-04-23: qty 90, 90d supply, fill #1

## 2021-03-06 ENCOUNTER — Other Ambulatory Visit (HOSPITAL_COMMUNITY): Payer: Self-pay

## 2021-03-06 DIAGNOSIS — R269 Unspecified abnormalities of gait and mobility: Secondary | ICD-10-CM | POA: Diagnosis not present

## 2021-03-06 DIAGNOSIS — Q8740 Marfan's syndrome, unspecified: Secondary | ICD-10-CM | POA: Diagnosis not present

## 2021-03-06 DIAGNOSIS — R29898 Other symptoms and signs involving the musculoskeletal system: Secondary | ICD-10-CM | POA: Diagnosis not present

## 2021-03-06 DIAGNOSIS — Z789 Other specified health status: Secondary | ICD-10-CM | POA: Diagnosis not present

## 2021-03-06 DIAGNOSIS — R279 Unspecified lack of coordination: Secondary | ICD-10-CM | POA: Diagnosis not present

## 2021-03-06 DIAGNOSIS — R531 Weakness: Secondary | ICD-10-CM | POA: Diagnosis not present

## 2021-03-06 DIAGNOSIS — R278 Other lack of coordination: Secondary | ICD-10-CM | POA: Diagnosis not present

## 2021-03-06 DIAGNOSIS — M6281 Muscle weakness (generalized): Secondary | ICD-10-CM | POA: Diagnosis not present

## 2021-03-06 DIAGNOSIS — Z952 Presence of prosthetic heart valve: Secondary | ICD-10-CM | POA: Diagnosis not present

## 2021-03-06 DIAGNOSIS — R6889 Other general symptoms and signs: Secondary | ICD-10-CM | POA: Diagnosis not present

## 2021-03-06 DIAGNOSIS — Z7409 Other reduced mobility: Secondary | ICD-10-CM | POA: Diagnosis not present

## 2021-03-06 DIAGNOSIS — R5381 Other malaise: Secondary | ICD-10-CM | POA: Diagnosis not present

## 2021-03-06 DIAGNOSIS — Z736 Limitation of activities due to disability: Secondary | ICD-10-CM | POA: Diagnosis not present

## 2021-03-07 DIAGNOSIS — Z9981 Dependence on supplemental oxygen: Secondary | ICD-10-CM | POA: Diagnosis not present

## 2021-03-07 DIAGNOSIS — R161 Splenomegaly, not elsewhere classified: Secondary | ICD-10-CM | POA: Diagnosis not present

## 2021-03-07 DIAGNOSIS — Q8740 Marfan's syndrome, unspecified: Secondary | ICD-10-CM | POA: Diagnosis not present

## 2021-03-07 DIAGNOSIS — I471 Supraventricular tachycardia: Secondary | ICD-10-CM | POA: Diagnosis not present

## 2021-03-07 DIAGNOSIS — D649 Anemia, unspecified: Secondary | ICD-10-CM | POA: Diagnosis not present

## 2021-03-07 DIAGNOSIS — I4892 Unspecified atrial flutter: Secondary | ICD-10-CM | POA: Diagnosis not present

## 2021-03-07 DIAGNOSIS — Z952 Presence of prosthetic heart valve: Secondary | ICD-10-CM | POA: Diagnosis not present

## 2021-03-07 DIAGNOSIS — I77810 Thoracic aortic ectasia: Secondary | ICD-10-CM | POA: Diagnosis not present

## 2021-03-07 DIAGNOSIS — I472 Ventricular tachycardia: Secondary | ICD-10-CM | POA: Diagnosis not present

## 2021-03-11 ENCOUNTER — Other Ambulatory Visit (HOSPITAL_COMMUNITY): Payer: Self-pay

## 2021-03-11 DIAGNOSIS — I4819 Other persistent atrial fibrillation: Secondary | ICD-10-CM | POA: Diagnosis not present

## 2021-03-11 DIAGNOSIS — Z952 Presence of prosthetic heart valve: Secondary | ICD-10-CM | POA: Diagnosis not present

## 2021-03-11 DIAGNOSIS — I4892 Unspecified atrial flutter: Secondary | ICD-10-CM | POA: Diagnosis not present

## 2021-03-11 MED ORDER — METOPROLOL TARTRATE 50 MG PO TABS
ORAL_TABLET | ORAL | 1 refills | Status: DC
  Filled 2021-03-11: qty 180, 90d supply, fill #0
  Filled 2021-06-09: qty 120, 60d supply, fill #1

## 2021-03-12 DIAGNOSIS — J9611 Chronic respiratory failure with hypoxia: Secondary | ICD-10-CM | POA: Diagnosis not present

## 2021-03-12 DIAGNOSIS — R5381 Other malaise: Secondary | ICD-10-CM | POA: Diagnosis not present

## 2021-03-13 DIAGNOSIS — R6889 Other general symptoms and signs: Secondary | ICD-10-CM | POA: Diagnosis not present

## 2021-03-13 DIAGNOSIS — Z7409 Other reduced mobility: Secondary | ICD-10-CM | POA: Diagnosis not present

## 2021-03-13 DIAGNOSIS — R791 Abnormal coagulation profile: Secondary | ICD-10-CM | POA: Diagnosis not present

## 2021-03-13 DIAGNOSIS — R279 Unspecified lack of coordination: Secondary | ICD-10-CM | POA: Diagnosis not present

## 2021-03-13 DIAGNOSIS — R5381 Other malaise: Secondary | ICD-10-CM | POA: Diagnosis not present

## 2021-03-13 DIAGNOSIS — R29898 Other symptoms and signs involving the musculoskeletal system: Secondary | ICD-10-CM | POA: Diagnosis not present

## 2021-03-13 DIAGNOSIS — Z789 Other specified health status: Secondary | ICD-10-CM | POA: Diagnosis not present

## 2021-03-13 DIAGNOSIS — R531 Weakness: Secondary | ICD-10-CM | POA: Diagnosis not present

## 2021-03-13 DIAGNOSIS — Z7901 Long term (current) use of anticoagulants: Secondary | ICD-10-CM | POA: Diagnosis not present

## 2021-03-13 DIAGNOSIS — Z952 Presence of prosthetic heart valve: Secondary | ICD-10-CM | POA: Diagnosis not present

## 2021-03-13 DIAGNOSIS — Q8740 Marfan's syndrome, unspecified: Secondary | ICD-10-CM | POA: Diagnosis not present

## 2021-03-13 DIAGNOSIS — Z5181 Encounter for therapeutic drug level monitoring: Secondary | ICD-10-CM | POA: Diagnosis not present

## 2021-03-14 DIAGNOSIS — R161 Splenomegaly, not elsewhere classified: Secondary | ICD-10-CM | POA: Diagnosis not present

## 2021-03-14 DIAGNOSIS — R162 Hepatomegaly with splenomegaly, not elsewhere classified: Secondary | ICD-10-CM | POA: Diagnosis not present

## 2021-03-19 DIAGNOSIS — R791 Abnormal coagulation profile: Secondary | ICD-10-CM | POA: Diagnosis not present

## 2021-03-19 DIAGNOSIS — Q8740 Marfan's syndrome, unspecified: Secondary | ICD-10-CM | POA: Diagnosis not present

## 2021-03-19 DIAGNOSIS — Z7901 Long term (current) use of anticoagulants: Secondary | ICD-10-CM | POA: Diagnosis not present

## 2021-03-19 DIAGNOSIS — Z952 Presence of prosthetic heart valve: Secondary | ICD-10-CM | POA: Diagnosis not present

## 2021-03-19 DIAGNOSIS — Z5181 Encounter for therapeutic drug level monitoring: Secondary | ICD-10-CM | POA: Diagnosis not present

## 2021-03-21 DIAGNOSIS — I491 Atrial premature depolarization: Secondary | ICD-10-CM | POA: Diagnosis not present

## 2021-03-21 DIAGNOSIS — I440 Atrioventricular block, first degree: Secondary | ICD-10-CM | POA: Diagnosis not present

## 2021-03-21 DIAGNOSIS — Z7901 Long term (current) use of anticoagulants: Secondary | ICD-10-CM | POA: Diagnosis not present

## 2021-03-21 DIAGNOSIS — I517 Cardiomegaly: Secondary | ICD-10-CM | POA: Diagnosis not present

## 2021-03-21 DIAGNOSIS — I4892 Unspecified atrial flutter: Secondary | ICD-10-CM | POA: Diagnosis not present

## 2021-03-21 DIAGNOSIS — I4510 Unspecified right bundle-branch block: Secondary | ICD-10-CM | POA: Diagnosis not present

## 2021-03-21 DIAGNOSIS — I4891 Unspecified atrial fibrillation: Secondary | ICD-10-CM | POA: Diagnosis not present

## 2021-03-22 DIAGNOSIS — I440 Atrioventricular block, first degree: Secondary | ICD-10-CM | POA: Diagnosis not present

## 2021-03-22 DIAGNOSIS — I491 Atrial premature depolarization: Secondary | ICD-10-CM | POA: Diagnosis not present

## 2021-03-25 DIAGNOSIS — Z1231 Encounter for screening mammogram for malignant neoplasm of breast: Secondary | ICD-10-CM | POA: Diagnosis not present

## 2021-03-25 DIAGNOSIS — Z1239 Encounter for other screening for malignant neoplasm of breast: Secondary | ICD-10-CM | POA: Diagnosis not present

## 2021-03-26 ENCOUNTER — Other Ambulatory Visit (HOSPITAL_COMMUNITY): Payer: Self-pay

## 2021-03-26 DIAGNOSIS — Z5181 Encounter for therapeutic drug level monitoring: Secondary | ICD-10-CM | POA: Diagnosis not present

## 2021-03-26 DIAGNOSIS — Q8740 Marfan's syndrome, unspecified: Secondary | ICD-10-CM | POA: Diagnosis not present

## 2021-03-26 DIAGNOSIS — Z7901 Long term (current) use of anticoagulants: Secondary | ICD-10-CM | POA: Diagnosis not present

## 2021-03-26 DIAGNOSIS — Z952 Presence of prosthetic heart valve: Secondary | ICD-10-CM | POA: Diagnosis not present

## 2021-03-26 MED ORDER — WARFARIN SODIUM 5 MG PO TABS
ORAL_TABLET | ORAL | 2 refills | Status: AC
  Filled 2021-03-26 – 2021-04-01 (×2): qty 120, 90d supply, fill #0
  Filled 2021-07-02: qty 120, 90d supply, fill #1
  Filled 2021-09-09: qty 120, 90d supply, fill #2

## 2021-03-31 DIAGNOSIS — H52223 Regular astigmatism, bilateral: Secondary | ICD-10-CM | POA: Diagnosis not present

## 2021-04-01 ENCOUNTER — Other Ambulatory Visit (HOSPITAL_COMMUNITY): Payer: Self-pay

## 2021-04-03 DIAGNOSIS — R922 Inconclusive mammogram: Secondary | ICD-10-CM | POA: Diagnosis not present

## 2021-04-03 DIAGNOSIS — R928 Other abnormal and inconclusive findings on diagnostic imaging of breast: Secondary | ICD-10-CM | POA: Diagnosis not present

## 2021-04-09 ENCOUNTER — Other Ambulatory Visit (HOSPITAL_COMMUNITY): Payer: Self-pay

## 2021-04-09 MED ORDER — DILTIAZEM HCL ER 120 MG PO CP24
ORAL_CAPSULE | ORAL | 1 refills | Status: AC
  Filled 2021-04-09: qty 90, 90d supply, fill #0

## 2021-04-10 ENCOUNTER — Other Ambulatory Visit (HOSPITAL_COMMUNITY): Payer: Self-pay

## 2021-04-12 DIAGNOSIS — J9611 Chronic respiratory failure with hypoxia: Secondary | ICD-10-CM | POA: Diagnosis not present

## 2021-04-12 DIAGNOSIS — R5381 Other malaise: Secondary | ICD-10-CM | POA: Diagnosis not present

## 2021-04-14 DIAGNOSIS — Z5181 Encounter for therapeutic drug level monitoring: Secondary | ICD-10-CM | POA: Diagnosis not present

## 2021-04-14 DIAGNOSIS — Z952 Presence of prosthetic heart valve: Secondary | ICD-10-CM | POA: Diagnosis not present

## 2021-04-14 DIAGNOSIS — Z7901 Long term (current) use of anticoagulants: Secondary | ICD-10-CM | POA: Diagnosis not present

## 2021-04-14 DIAGNOSIS — R160 Hepatomegaly, not elsewhere classified: Secondary | ICD-10-CM | POA: Diagnosis not present

## 2021-04-14 DIAGNOSIS — R161 Splenomegaly, not elsewhere classified: Secondary | ICD-10-CM | POA: Diagnosis not present

## 2021-04-14 DIAGNOSIS — Q8740 Marfan's syndrome, unspecified: Secondary | ICD-10-CM | POA: Diagnosis not present

## 2021-04-23 ENCOUNTER — Other Ambulatory Visit (HOSPITAL_COMMUNITY): Payer: Self-pay

## 2021-04-23 DIAGNOSIS — Z5181 Encounter for therapeutic drug level monitoring: Secondary | ICD-10-CM | POA: Diagnosis not present

## 2021-04-23 DIAGNOSIS — Q8740 Marfan's syndrome, unspecified: Secondary | ICD-10-CM | POA: Diagnosis not present

## 2021-04-23 DIAGNOSIS — Z7901 Long term (current) use of anticoagulants: Secondary | ICD-10-CM | POA: Diagnosis not present

## 2021-04-23 DIAGNOSIS — Z952 Presence of prosthetic heart valve: Secondary | ICD-10-CM | POA: Diagnosis not present

## 2021-04-23 DIAGNOSIS — R791 Abnormal coagulation profile: Secondary | ICD-10-CM | POA: Diagnosis not present

## 2021-04-29 DIAGNOSIS — R160 Hepatomegaly, not elsewhere classified: Secondary | ICD-10-CM | POA: Diagnosis not present

## 2021-04-30 DIAGNOSIS — I472 Ventricular tachycardia: Secondary | ICD-10-CM | POA: Diagnosis not present

## 2021-04-30 DIAGNOSIS — Z952 Presence of prosthetic heart valve: Secondary | ICD-10-CM | POA: Diagnosis not present

## 2021-04-30 DIAGNOSIS — I77810 Thoracic aortic ectasia: Secondary | ICD-10-CM | POA: Diagnosis not present

## 2021-04-30 DIAGNOSIS — I519 Heart disease, unspecified: Secondary | ICD-10-CM | POA: Diagnosis not present

## 2021-04-30 DIAGNOSIS — I339 Acute and subacute endocarditis, unspecified: Secondary | ICD-10-CM | POA: Diagnosis not present

## 2021-04-30 DIAGNOSIS — R002 Palpitations: Secondary | ICD-10-CM | POA: Diagnosis not present

## 2021-04-30 DIAGNOSIS — Q8740 Marfan's syndrome, unspecified: Secondary | ICD-10-CM | POA: Diagnosis not present

## 2021-04-30 DIAGNOSIS — I5020 Unspecified systolic (congestive) heart failure: Secondary | ICD-10-CM | POA: Diagnosis not present

## 2021-05-06 ENCOUNTER — Other Ambulatory Visit (HOSPITAL_COMMUNITY): Payer: Self-pay

## 2021-05-06 DIAGNOSIS — I440 Atrioventricular block, first degree: Secondary | ICD-10-CM | POA: Diagnosis not present

## 2021-05-06 DIAGNOSIS — I471 Supraventricular tachycardia: Secondary | ICD-10-CM | POA: Diagnosis not present

## 2021-05-06 DIAGNOSIS — Z7901 Long term (current) use of anticoagulants: Secondary | ICD-10-CM | POA: Diagnosis not present

## 2021-05-06 DIAGNOSIS — I480 Paroxysmal atrial fibrillation: Secondary | ICD-10-CM | POA: Diagnosis not present

## 2021-05-06 DIAGNOSIS — Q8740 Marfan's syndrome, unspecified: Secondary | ICD-10-CM | POA: Diagnosis not present

## 2021-05-06 DIAGNOSIS — I4892 Unspecified atrial flutter: Secondary | ICD-10-CM | POA: Diagnosis not present

## 2021-05-06 DIAGNOSIS — I4510 Unspecified right bundle-branch block: Secondary | ICD-10-CM | POA: Diagnosis not present

## 2021-05-06 DIAGNOSIS — Z5181 Encounter for therapeutic drug level monitoring: Secondary | ICD-10-CM | POA: Diagnosis not present

## 2021-05-06 DIAGNOSIS — R791 Abnormal coagulation profile: Secondary | ICD-10-CM | POA: Diagnosis not present

## 2021-05-06 DIAGNOSIS — Z952 Presence of prosthetic heart valve: Secondary | ICD-10-CM | POA: Diagnosis not present

## 2021-05-06 MED ORDER — FUROSEMIDE 40 MG PO TABS
ORAL_TABLET | ORAL | 1 refills | Status: AC
  Filled 2021-05-06: qty 30, 30d supply, fill #0

## 2021-05-12 DIAGNOSIS — R5381 Other malaise: Secondary | ICD-10-CM | POA: Diagnosis not present

## 2021-05-12 DIAGNOSIS — J9611 Chronic respiratory failure with hypoxia: Secondary | ICD-10-CM | POA: Diagnosis not present

## 2021-05-13 DIAGNOSIS — Q8740 Marfan's syndrome, unspecified: Secondary | ICD-10-CM | POA: Diagnosis not present

## 2021-05-13 DIAGNOSIS — R791 Abnormal coagulation profile: Secondary | ICD-10-CM | POA: Diagnosis not present

## 2021-05-13 DIAGNOSIS — Z5181 Encounter for therapeutic drug level monitoring: Secondary | ICD-10-CM | POA: Diagnosis not present

## 2021-05-13 DIAGNOSIS — Z7901 Long term (current) use of anticoagulants: Secondary | ICD-10-CM | POA: Diagnosis not present

## 2021-05-13 DIAGNOSIS — Z952 Presence of prosthetic heart valve: Secondary | ICD-10-CM | POA: Diagnosis not present

## 2021-05-16 DIAGNOSIS — G4734 Idiopathic sleep related nonobstructive alveolar hypoventilation: Secondary | ICD-10-CM | POA: Diagnosis not present

## 2021-05-16 DIAGNOSIS — I519 Heart disease, unspecified: Secondary | ICD-10-CM | POA: Diagnosis not present

## 2021-05-16 DIAGNOSIS — Q8740 Marfan's syndrome, unspecified: Secondary | ICD-10-CM | POA: Diagnosis not present

## 2021-05-16 DIAGNOSIS — I480 Paroxysmal atrial fibrillation: Secondary | ICD-10-CM | POA: Diagnosis not present

## 2021-05-16 DIAGNOSIS — Z952 Presence of prosthetic heart valve: Secondary | ICD-10-CM | POA: Diagnosis not present

## 2021-05-19 ENCOUNTER — Other Ambulatory Visit (HOSPITAL_COMMUNITY): Payer: Self-pay

## 2021-05-19 MED ORDER — LOSARTAN POTASSIUM 25 MG PO TABS
25.0000 mg | ORAL_TABLET | Freq: Every day | ORAL | 3 refills | Status: DC
  Filled 2021-05-19: qty 90, 90d supply, fill #0

## 2021-05-20 DIAGNOSIS — Z952 Presence of prosthetic heart valve: Secondary | ICD-10-CM | POA: Diagnosis not present

## 2021-05-20 DIAGNOSIS — Z7901 Long term (current) use of anticoagulants: Secondary | ICD-10-CM | POA: Diagnosis not present

## 2021-05-20 DIAGNOSIS — Z5181 Encounter for therapeutic drug level monitoring: Secondary | ICD-10-CM | POA: Diagnosis not present

## 2021-05-20 DIAGNOSIS — Q8740 Marfan's syndrome, unspecified: Secondary | ICD-10-CM | POA: Diagnosis not present

## 2021-05-29 ENCOUNTER — Other Ambulatory Visit (HOSPITAL_COMMUNITY): Payer: Self-pay

## 2021-05-29 DIAGNOSIS — Q8740 Marfan's syndrome, unspecified: Secondary | ICD-10-CM | POA: Diagnosis not present

## 2021-05-29 DIAGNOSIS — Z7901 Long term (current) use of anticoagulants: Secondary | ICD-10-CM | POA: Diagnosis not present

## 2021-05-29 DIAGNOSIS — R791 Abnormal coagulation profile: Secondary | ICD-10-CM | POA: Diagnosis not present

## 2021-05-29 DIAGNOSIS — Z952 Presence of prosthetic heart valve: Secondary | ICD-10-CM | POA: Diagnosis not present

## 2021-05-29 DIAGNOSIS — Z5181 Encounter for therapeutic drug level monitoring: Secondary | ICD-10-CM | POA: Diagnosis not present

## 2021-05-29 MED ORDER — ENOXAPARIN SODIUM 60 MG/0.6ML IJ SOSY
PREFILLED_SYRINGE | INTRAMUSCULAR | 0 refills | Status: AC
  Filled 2021-05-29: qty 12, 10d supply, fill #0

## 2021-06-04 DIAGNOSIS — Z7901 Long term (current) use of anticoagulants: Secondary | ICD-10-CM | POA: Diagnosis not present

## 2021-06-04 DIAGNOSIS — Z952 Presence of prosthetic heart valve: Secondary | ICD-10-CM | POA: Diagnosis not present

## 2021-06-04 DIAGNOSIS — R791 Abnormal coagulation profile: Secondary | ICD-10-CM | POA: Diagnosis not present

## 2021-06-04 DIAGNOSIS — Q8740 Marfan's syndrome, unspecified: Secondary | ICD-10-CM | POA: Diagnosis not present

## 2021-06-04 DIAGNOSIS — Z5181 Encounter for therapeutic drug level monitoring: Secondary | ICD-10-CM | POA: Diagnosis not present

## 2021-06-05 DIAGNOSIS — R7989 Other specified abnormal findings of blood chemistry: Secondary | ICD-10-CM | POA: Diagnosis not present

## 2021-06-05 DIAGNOSIS — R160 Hepatomegaly, not elsewhere classified: Secondary | ICD-10-CM | POA: Diagnosis not present

## 2021-06-09 ENCOUNTER — Other Ambulatory Visit (HOSPITAL_COMMUNITY): Payer: Self-pay

## 2021-06-11 DIAGNOSIS — Z7901 Long term (current) use of anticoagulants: Secondary | ICD-10-CM | POA: Diagnosis not present

## 2021-06-11 DIAGNOSIS — Z952 Presence of prosthetic heart valve: Secondary | ICD-10-CM | POA: Diagnosis not present

## 2021-06-11 DIAGNOSIS — R791 Abnormal coagulation profile: Secondary | ICD-10-CM | POA: Diagnosis not present

## 2021-06-11 DIAGNOSIS — Q8740 Marfan's syndrome, unspecified: Secondary | ICD-10-CM | POA: Diagnosis not present

## 2021-06-11 DIAGNOSIS — Z5181 Encounter for therapeutic drug level monitoring: Secondary | ICD-10-CM | POA: Diagnosis not present

## 2021-06-11 DIAGNOSIS — Z48812 Encounter for surgical aftercare following surgery on the circulatory system: Secondary | ICD-10-CM | POA: Diagnosis not present

## 2021-06-12 DIAGNOSIS — R5381 Other malaise: Secondary | ICD-10-CM | POA: Diagnosis not present

## 2021-06-12 DIAGNOSIS — J9611 Chronic respiratory failure with hypoxia: Secondary | ICD-10-CM | POA: Diagnosis not present

## 2021-06-17 DIAGNOSIS — Z952 Presence of prosthetic heart valve: Secondary | ICD-10-CM | POA: Diagnosis not present

## 2021-06-17 DIAGNOSIS — Z48812 Encounter for surgical aftercare following surgery on the circulatory system: Secondary | ICD-10-CM | POA: Diagnosis not present

## 2021-06-18 DIAGNOSIS — Z952 Presence of prosthetic heart valve: Secondary | ICD-10-CM | POA: Diagnosis not present

## 2021-06-18 DIAGNOSIS — Z5181 Encounter for therapeutic drug level monitoring: Secondary | ICD-10-CM | POA: Diagnosis not present

## 2021-06-18 DIAGNOSIS — Z7901 Long term (current) use of anticoagulants: Secondary | ICD-10-CM | POA: Diagnosis not present

## 2021-06-18 DIAGNOSIS — Q8740 Marfan's syndrome, unspecified: Secondary | ICD-10-CM | POA: Diagnosis not present

## 2021-06-19 DIAGNOSIS — Z48812 Encounter for surgical aftercare following surgery on the circulatory system: Secondary | ICD-10-CM | POA: Diagnosis not present

## 2021-06-19 DIAGNOSIS — Z952 Presence of prosthetic heart valve: Secondary | ICD-10-CM | POA: Diagnosis not present

## 2021-06-20 DIAGNOSIS — Z48812 Encounter for surgical aftercare following surgery on the circulatory system: Secondary | ICD-10-CM | POA: Diagnosis not present

## 2021-06-20 DIAGNOSIS — Z952 Presence of prosthetic heart valve: Secondary | ICD-10-CM | POA: Diagnosis not present

## 2021-06-24 DIAGNOSIS — Z952 Presence of prosthetic heart valve: Secondary | ICD-10-CM | POA: Diagnosis not present

## 2021-06-24 DIAGNOSIS — Z48812 Encounter for surgical aftercare following surgery on the circulatory system: Secondary | ICD-10-CM | POA: Diagnosis not present

## 2021-06-27 DIAGNOSIS — Z952 Presence of prosthetic heart valve: Secondary | ICD-10-CM | POA: Diagnosis not present

## 2021-06-27 DIAGNOSIS — I480 Paroxysmal atrial fibrillation: Secondary | ICD-10-CM | POA: Diagnosis not present

## 2021-06-27 DIAGNOSIS — G4733 Obstructive sleep apnea (adult) (pediatric): Secondary | ICD-10-CM | POA: Diagnosis not present

## 2021-06-27 DIAGNOSIS — G4734 Idiopathic sleep related nonobstructive alveolar hypoventilation: Secondary | ICD-10-CM | POA: Diagnosis not present

## 2021-06-27 DIAGNOSIS — Q8740 Marfan's syndrome, unspecified: Secondary | ICD-10-CM | POA: Diagnosis not present

## 2021-06-27 DIAGNOSIS — I519 Heart disease, unspecified: Secondary | ICD-10-CM | POA: Diagnosis not present

## 2021-07-01 DIAGNOSIS — Z48812 Encounter for surgical aftercare following surgery on the circulatory system: Secondary | ICD-10-CM | POA: Diagnosis not present

## 2021-07-01 DIAGNOSIS — Z952 Presence of prosthetic heart valve: Secondary | ICD-10-CM | POA: Diagnosis not present

## 2021-07-02 ENCOUNTER — Other Ambulatory Visit (HOSPITAL_COMMUNITY): Payer: Self-pay

## 2021-07-02 DIAGNOSIS — R791 Abnormal coagulation profile: Secondary | ICD-10-CM | POA: Diagnosis not present

## 2021-07-02 DIAGNOSIS — Z952 Presence of prosthetic heart valve: Secondary | ICD-10-CM | POA: Diagnosis not present

## 2021-07-02 DIAGNOSIS — Q8740 Marfan's syndrome, unspecified: Secondary | ICD-10-CM | POA: Diagnosis not present

## 2021-07-02 DIAGNOSIS — Z7901 Long term (current) use of anticoagulants: Secondary | ICD-10-CM | POA: Diagnosis not present

## 2021-07-02 DIAGNOSIS — Z5181 Encounter for therapeutic drug level monitoring: Secondary | ICD-10-CM | POA: Diagnosis not present

## 2021-07-03 DIAGNOSIS — Z952 Presence of prosthetic heart valve: Secondary | ICD-10-CM | POA: Diagnosis not present

## 2021-07-03 DIAGNOSIS — Z48812 Encounter for surgical aftercare following surgery on the circulatory system: Secondary | ICD-10-CM | POA: Diagnosis not present

## 2021-07-04 DIAGNOSIS — Z48812 Encounter for surgical aftercare following surgery on the circulatory system: Secondary | ICD-10-CM | POA: Diagnosis not present

## 2021-07-04 DIAGNOSIS — Z952 Presence of prosthetic heart valve: Secondary | ICD-10-CM | POA: Diagnosis not present

## 2021-07-08 DIAGNOSIS — Z952 Presence of prosthetic heart valve: Secondary | ICD-10-CM | POA: Diagnosis not present

## 2021-07-08 DIAGNOSIS — Z48812 Encounter for surgical aftercare following surgery on the circulatory system: Secondary | ICD-10-CM | POA: Diagnosis not present

## 2021-07-10 DIAGNOSIS — Z48812 Encounter for surgical aftercare following surgery on the circulatory system: Secondary | ICD-10-CM | POA: Diagnosis not present

## 2021-07-10 DIAGNOSIS — Z952 Presence of prosthetic heart valve: Secondary | ICD-10-CM | POA: Diagnosis not present

## 2021-07-11 DIAGNOSIS — Z48812 Encounter for surgical aftercare following surgery on the circulatory system: Secondary | ICD-10-CM | POA: Diagnosis not present

## 2021-07-11 DIAGNOSIS — Z952 Presence of prosthetic heart valve: Secondary | ICD-10-CM | POA: Diagnosis not present

## 2021-07-12 DIAGNOSIS — R5381 Other malaise: Secondary | ICD-10-CM | POA: Diagnosis not present

## 2021-07-12 DIAGNOSIS — J9611 Chronic respiratory failure with hypoxia: Secondary | ICD-10-CM | POA: Diagnosis not present

## 2021-07-15 DIAGNOSIS — Z952 Presence of prosthetic heart valve: Secondary | ICD-10-CM | POA: Diagnosis not present

## 2021-07-15 DIAGNOSIS — Z48812 Encounter for surgical aftercare following surgery on the circulatory system: Secondary | ICD-10-CM | POA: Diagnosis not present

## 2021-07-16 DIAGNOSIS — Z7901 Long term (current) use of anticoagulants: Secondary | ICD-10-CM | POA: Diagnosis not present

## 2021-07-16 DIAGNOSIS — R791 Abnormal coagulation profile: Secondary | ICD-10-CM | POA: Diagnosis not present

## 2021-07-16 DIAGNOSIS — Q8740 Marfan's syndrome, unspecified: Secondary | ICD-10-CM | POA: Diagnosis not present

## 2021-07-16 DIAGNOSIS — Z5181 Encounter for therapeutic drug level monitoring: Secondary | ICD-10-CM | POA: Diagnosis not present

## 2021-07-16 DIAGNOSIS — Z952 Presence of prosthetic heart valve: Secondary | ICD-10-CM | POA: Diagnosis not present

## 2021-07-17 DIAGNOSIS — Z952 Presence of prosthetic heart valve: Secondary | ICD-10-CM | POA: Diagnosis not present

## 2021-07-17 DIAGNOSIS — Z48812 Encounter for surgical aftercare following surgery on the circulatory system: Secondary | ICD-10-CM | POA: Diagnosis not present

## 2021-07-18 DIAGNOSIS — Z952 Presence of prosthetic heart valve: Secondary | ICD-10-CM | POA: Diagnosis not present

## 2021-07-18 DIAGNOSIS — Z48812 Encounter for surgical aftercare following surgery on the circulatory system: Secondary | ICD-10-CM | POA: Diagnosis not present

## 2021-07-22 DIAGNOSIS — Z952 Presence of prosthetic heart valve: Secondary | ICD-10-CM | POA: Diagnosis not present

## 2021-07-22 DIAGNOSIS — G4733 Obstructive sleep apnea (adult) (pediatric): Secondary | ICD-10-CM | POA: Diagnosis not present

## 2021-07-22 DIAGNOSIS — Z48812 Encounter for surgical aftercare following surgery on the circulatory system: Secondary | ICD-10-CM | POA: Diagnosis not present

## 2021-07-24 DIAGNOSIS — Z952 Presence of prosthetic heart valve: Secondary | ICD-10-CM | POA: Diagnosis not present

## 2021-07-24 DIAGNOSIS — Z48812 Encounter for surgical aftercare following surgery on the circulatory system: Secondary | ICD-10-CM | POA: Diagnosis not present

## 2021-07-25 DIAGNOSIS — Z5181 Encounter for therapeutic drug level monitoring: Secondary | ICD-10-CM | POA: Diagnosis not present

## 2021-07-25 DIAGNOSIS — Z952 Presence of prosthetic heart valve: Secondary | ICD-10-CM | POA: Diagnosis not present

## 2021-07-25 DIAGNOSIS — Z7901 Long term (current) use of anticoagulants: Secondary | ICD-10-CM | POA: Diagnosis not present

## 2021-07-25 DIAGNOSIS — Q8740 Marfan's syndrome, unspecified: Secondary | ICD-10-CM | POA: Diagnosis not present

## 2021-07-25 DIAGNOSIS — R791 Abnormal coagulation profile: Secondary | ICD-10-CM | POA: Diagnosis not present

## 2021-07-29 DIAGNOSIS — Z48812 Encounter for surgical aftercare following surgery on the circulatory system: Secondary | ICD-10-CM | POA: Diagnosis not present

## 2021-07-29 DIAGNOSIS — Z952 Presence of prosthetic heart valve: Secondary | ICD-10-CM | POA: Diagnosis not present

## 2021-07-30 ENCOUNTER — Other Ambulatory Visit (HOSPITAL_COMMUNITY): Payer: Self-pay

## 2021-07-30 DIAGNOSIS — Z952 Presence of prosthetic heart valve: Secondary | ICD-10-CM | POA: Diagnosis not present

## 2021-07-30 DIAGNOSIS — G4733 Obstructive sleep apnea (adult) (pediatric): Secondary | ICD-10-CM | POA: Diagnosis not present

## 2021-07-30 DIAGNOSIS — Q87410 Marfan's syndrome with aortic dilation: Secondary | ICD-10-CM | POA: Diagnosis not present

## 2021-07-30 DIAGNOSIS — I38 Endocarditis, valve unspecified: Secondary | ICD-10-CM | POA: Diagnosis not present

## 2021-07-30 DIAGNOSIS — Z7901 Long term (current) use of anticoagulants: Secondary | ICD-10-CM | POA: Diagnosis not present

## 2021-07-30 DIAGNOSIS — Q8740 Marfan's syndrome, unspecified: Secondary | ICD-10-CM | POA: Diagnosis not present

## 2021-07-30 DIAGNOSIS — Z79899 Other long term (current) drug therapy: Secondary | ICD-10-CM | POA: Diagnosis not present

## 2021-07-30 DIAGNOSIS — I9789 Other postprocedural complications and disorders of the circulatory system, not elsewhere classified: Secondary | ICD-10-CM | POA: Diagnosis not present

## 2021-07-30 DIAGNOSIS — I340 Nonrheumatic mitral (valve) insufficiency: Secondary | ICD-10-CM | POA: Diagnosis not present

## 2021-07-30 MED ORDER — ENTRESTO 24-26 MG PO TABS
1.0000 | ORAL_TABLET | Freq: Two times a day (BID) | ORAL | 3 refills | Status: DC
  Filled 2021-07-30: qty 60, 30d supply, fill #0
  Filled 2021-09-20 (×2): qty 180, 90d supply, fill #1

## 2021-07-30 MED ORDER — SPIRONOLACTONE 25 MG PO TABS
12.5000 mg | ORAL_TABLET | Freq: Every day | ORAL | 3 refills | Status: DC
  Filled 2021-07-30: qty 45, 90d supply, fill #0
  Filled 2021-10-23: qty 45, 90d supply, fill #1
  Filled 2022-01-21: qty 45, 90d supply, fill #2
  Filled 2022-04-21: qty 45, 90d supply, fill #3
  Filled 2022-07-23: qty 45, 90d supply, fill #4

## 2021-07-31 DIAGNOSIS — Z952 Presence of prosthetic heart valve: Secondary | ICD-10-CM | POA: Diagnosis not present

## 2021-07-31 DIAGNOSIS — Z48812 Encounter for surgical aftercare following surgery on the circulatory system: Secondary | ICD-10-CM | POA: Diagnosis not present

## 2021-08-01 DIAGNOSIS — Z48812 Encounter for surgical aftercare following surgery on the circulatory system: Secondary | ICD-10-CM | POA: Diagnosis not present

## 2021-08-01 DIAGNOSIS — Z952 Presence of prosthetic heart valve: Secondary | ICD-10-CM | POA: Diagnosis not present

## 2021-08-05 DIAGNOSIS — I480 Paroxysmal atrial fibrillation: Secondary | ICD-10-CM | POA: Diagnosis not present

## 2021-08-05 DIAGNOSIS — Z952 Presence of prosthetic heart valve: Secondary | ICD-10-CM | POA: Diagnosis not present

## 2021-08-05 DIAGNOSIS — Q8740 Marfan's syndrome, unspecified: Secondary | ICD-10-CM | POA: Diagnosis not present

## 2021-08-05 DIAGNOSIS — R001 Bradycardia, unspecified: Secondary | ICD-10-CM | POA: Diagnosis not present

## 2021-08-06 ENCOUNTER — Other Ambulatory Visit (HOSPITAL_COMMUNITY): Payer: Self-pay

## 2021-08-06 DIAGNOSIS — Z952 Presence of prosthetic heart valve: Secondary | ICD-10-CM | POA: Diagnosis not present

## 2021-08-06 DIAGNOSIS — Q8740 Marfan's syndrome, unspecified: Secondary | ICD-10-CM | POA: Diagnosis not present

## 2021-08-06 DIAGNOSIS — R791 Abnormal coagulation profile: Secondary | ICD-10-CM | POA: Diagnosis not present

## 2021-08-06 DIAGNOSIS — Z5181 Encounter for therapeutic drug level monitoring: Secondary | ICD-10-CM | POA: Diagnosis not present

## 2021-08-06 DIAGNOSIS — Z7901 Long term (current) use of anticoagulants: Secondary | ICD-10-CM | POA: Diagnosis not present

## 2021-08-06 MED ORDER — METOPROLOL TARTRATE 50 MG PO TABS
ORAL_TABLET | ORAL | 3 refills | Status: DC
  Filled 2021-08-06: qty 180, 90d supply, fill #0
  Filled 2021-11-06: qty 180, 90d supply, fill #1
  Filled 2022-02-06: qty 180, 90d supply, fill #2
  Filled 2022-05-10: qty 180, 90d supply, fill #3

## 2021-08-07 DIAGNOSIS — Z48812 Encounter for surgical aftercare following surgery on the circulatory system: Secondary | ICD-10-CM | POA: Diagnosis not present

## 2021-08-07 DIAGNOSIS — Z952 Presence of prosthetic heart valve: Secondary | ICD-10-CM | POA: Diagnosis not present

## 2021-08-08 DIAGNOSIS — Z48812 Encounter for surgical aftercare following surgery on the circulatory system: Secondary | ICD-10-CM | POA: Diagnosis not present

## 2021-08-08 DIAGNOSIS — Z952 Presence of prosthetic heart valve: Secondary | ICD-10-CM | POA: Diagnosis not present

## 2021-08-12 DIAGNOSIS — R0683 Snoring: Secondary | ICD-10-CM | POA: Diagnosis not present

## 2021-08-12 DIAGNOSIS — Z952 Presence of prosthetic heart valve: Secondary | ICD-10-CM | POA: Diagnosis not present

## 2021-08-12 DIAGNOSIS — Z48812 Encounter for surgical aftercare following surgery on the circulatory system: Secondary | ICD-10-CM | POA: Diagnosis not present

## 2021-08-12 DIAGNOSIS — G4730 Sleep apnea, unspecified: Secondary | ICD-10-CM | POA: Diagnosis not present

## 2021-08-14 DIAGNOSIS — Z952 Presence of prosthetic heart valve: Secondary | ICD-10-CM | POA: Diagnosis not present

## 2021-08-14 DIAGNOSIS — Z48812 Encounter for surgical aftercare following surgery on the circulatory system: Secondary | ICD-10-CM | POA: Diagnosis not present

## 2021-08-14 DIAGNOSIS — Q8740 Marfan's syndrome, unspecified: Secondary | ICD-10-CM | POA: Diagnosis not present

## 2021-08-21 DIAGNOSIS — Z952 Presence of prosthetic heart valve: Secondary | ICD-10-CM | POA: Diagnosis not present

## 2021-08-21 DIAGNOSIS — Z48812 Encounter for surgical aftercare following surgery on the circulatory system: Secondary | ICD-10-CM | POA: Diagnosis not present

## 2021-08-22 DIAGNOSIS — Z952 Presence of prosthetic heart valve: Secondary | ICD-10-CM | POA: Diagnosis not present

## 2021-08-22 DIAGNOSIS — Z48812 Encounter for surgical aftercare following surgery on the circulatory system: Secondary | ICD-10-CM | POA: Diagnosis not present

## 2021-08-22 DIAGNOSIS — G4733 Obstructive sleep apnea (adult) (pediatric): Secondary | ICD-10-CM | POA: Diagnosis not present

## 2021-08-26 DIAGNOSIS — Z48812 Encounter for surgical aftercare following surgery on the circulatory system: Secondary | ICD-10-CM | POA: Diagnosis not present

## 2021-08-26 DIAGNOSIS — Z952 Presence of prosthetic heart valve: Secondary | ICD-10-CM | POA: Diagnosis not present

## 2021-08-27 DIAGNOSIS — Z7901 Long term (current) use of anticoagulants: Secondary | ICD-10-CM | POA: Diagnosis not present

## 2021-08-27 DIAGNOSIS — R791 Abnormal coagulation profile: Secondary | ICD-10-CM | POA: Diagnosis not present

## 2021-08-27 DIAGNOSIS — Q8740 Marfan's syndrome, unspecified: Secondary | ICD-10-CM | POA: Diagnosis not present

## 2021-08-27 DIAGNOSIS — Z952 Presence of prosthetic heart valve: Secondary | ICD-10-CM | POA: Diagnosis not present

## 2021-08-27 DIAGNOSIS — Z5181 Encounter for therapeutic drug level monitoring: Secondary | ICD-10-CM | POA: Diagnosis not present

## 2021-09-02 DIAGNOSIS — Z952 Presence of prosthetic heart valve: Secondary | ICD-10-CM | POA: Diagnosis not present

## 2021-09-02 DIAGNOSIS — Z48812 Encounter for surgical aftercare following surgery on the circulatory system: Secondary | ICD-10-CM | POA: Diagnosis not present

## 2021-09-04 DIAGNOSIS — Z952 Presence of prosthetic heart valve: Secondary | ICD-10-CM | POA: Diagnosis not present

## 2021-09-04 DIAGNOSIS — I4892 Unspecified atrial flutter: Secondary | ICD-10-CM | POA: Diagnosis not present

## 2021-09-04 DIAGNOSIS — Z0000 Encounter for general adult medical examination without abnormal findings: Secondary | ICD-10-CM | POA: Diagnosis not present

## 2021-09-04 DIAGNOSIS — Z13228 Encounter for screening for other metabolic disorders: Secondary | ICD-10-CM | POA: Diagnosis not present

## 2021-09-04 DIAGNOSIS — Z13220 Encounter for screening for lipoid disorders: Secondary | ICD-10-CM | POA: Diagnosis not present

## 2021-09-04 DIAGNOSIS — Z48812 Encounter for surgical aftercare following surgery on the circulatory system: Secondary | ICD-10-CM | POA: Diagnosis not present

## 2021-09-04 DIAGNOSIS — I480 Paroxysmal atrial fibrillation: Secondary | ICD-10-CM | POA: Diagnosis not present

## 2021-09-04 DIAGNOSIS — R0683 Snoring: Secondary | ICD-10-CM | POA: Diagnosis not present

## 2021-09-04 DIAGNOSIS — Z1329 Encounter for screening for other suspected endocrine disorder: Secondary | ICD-10-CM | POA: Diagnosis not present

## 2021-09-04 DIAGNOSIS — Z79899 Other long term (current) drug therapy: Secondary | ICD-10-CM | POA: Diagnosis not present

## 2021-09-04 DIAGNOSIS — I77810 Thoracic aortic ectasia: Secondary | ICD-10-CM | POA: Diagnosis not present

## 2021-09-04 DIAGNOSIS — G4730 Sleep apnea, unspecified: Secondary | ICD-10-CM | POA: Diagnosis not present

## 2021-09-04 DIAGNOSIS — Z7901 Long term (current) use of anticoagulants: Secondary | ICD-10-CM | POA: Diagnosis not present

## 2021-09-05 DIAGNOSIS — Z48812 Encounter for surgical aftercare following surgery on the circulatory system: Secondary | ICD-10-CM | POA: Diagnosis not present

## 2021-09-05 DIAGNOSIS — Z952 Presence of prosthetic heart valve: Secondary | ICD-10-CM | POA: Diagnosis not present

## 2021-09-08 DIAGNOSIS — Z0000 Encounter for general adult medical examination without abnormal findings: Secondary | ICD-10-CM | POA: Diagnosis not present

## 2021-09-08 DIAGNOSIS — D509 Iron deficiency anemia, unspecified: Secondary | ICD-10-CM | POA: Diagnosis not present

## 2021-09-08 DIAGNOSIS — Q676 Pectus excavatum: Secondary | ICD-10-CM | POA: Diagnosis not present

## 2021-09-08 DIAGNOSIS — I480 Paroxysmal atrial fibrillation: Secondary | ICD-10-CM | POA: Diagnosis not present

## 2021-09-08 DIAGNOSIS — Z952 Presence of prosthetic heart valve: Secondary | ICD-10-CM | POA: Diagnosis not present

## 2021-09-08 DIAGNOSIS — F4310 Post-traumatic stress disorder, unspecified: Secondary | ICD-10-CM | POA: Diagnosis not present

## 2021-09-08 DIAGNOSIS — Q8740 Marfan's syndrome, unspecified: Secondary | ICD-10-CM | POA: Diagnosis not present

## 2021-09-08 DIAGNOSIS — R079 Chest pain, unspecified: Secondary | ICD-10-CM | POA: Diagnosis not present

## 2021-09-08 DIAGNOSIS — I4892 Unspecified atrial flutter: Secondary | ICD-10-CM | POA: Diagnosis not present

## 2021-09-08 DIAGNOSIS — M898X1 Other specified disorders of bone, shoulder: Secondary | ICD-10-CM | POA: Diagnosis not present

## 2021-09-08 DIAGNOSIS — I77810 Thoracic aortic ectasia: Secondary | ICD-10-CM | POA: Diagnosis not present

## 2021-09-09 DIAGNOSIS — Z952 Presence of prosthetic heart valve: Secondary | ICD-10-CM | POA: Diagnosis not present

## 2021-09-09 DIAGNOSIS — Z48812 Encounter for surgical aftercare following surgery on the circulatory system: Secondary | ICD-10-CM | POA: Diagnosis not present

## 2021-09-10 ENCOUNTER — Other Ambulatory Visit (HOSPITAL_COMMUNITY): Payer: Self-pay

## 2021-09-10 DIAGNOSIS — Z952 Presence of prosthetic heart valve: Secondary | ICD-10-CM | POA: Diagnosis not present

## 2021-09-10 DIAGNOSIS — Q8740 Marfan's syndrome, unspecified: Secondary | ICD-10-CM | POA: Diagnosis not present

## 2021-09-10 DIAGNOSIS — Z7901 Long term (current) use of anticoagulants: Secondary | ICD-10-CM | POA: Diagnosis not present

## 2021-09-11 DIAGNOSIS — Z48812 Encounter for surgical aftercare following surgery on the circulatory system: Secondary | ICD-10-CM | POA: Diagnosis not present

## 2021-09-11 DIAGNOSIS — Z952 Presence of prosthetic heart valve: Secondary | ICD-10-CM | POA: Diagnosis not present

## 2021-09-12 ENCOUNTER — Other Ambulatory Visit (HOSPITAL_COMMUNITY): Payer: Self-pay

## 2021-09-12 DIAGNOSIS — Z952 Presence of prosthetic heart valve: Secondary | ICD-10-CM | POA: Diagnosis not present

## 2021-09-12 DIAGNOSIS — Z48812 Encounter for surgical aftercare following surgery on the circulatory system: Secondary | ICD-10-CM | POA: Diagnosis not present

## 2021-09-12 MED ORDER — WARFARIN SODIUM 5 MG PO TABS
ORAL_TABLET | ORAL | 0 refills | Status: AC
  Filled 2021-09-12: qty 180, 90d supply, fill #0

## 2021-09-15 ENCOUNTER — Other Ambulatory Visit (HOSPITAL_COMMUNITY): Payer: Self-pay

## 2021-09-16 DIAGNOSIS — Z48812 Encounter for surgical aftercare following surgery on the circulatory system: Secondary | ICD-10-CM | POA: Diagnosis not present

## 2021-09-16 DIAGNOSIS — Z952 Presence of prosthetic heart valve: Secondary | ICD-10-CM | POA: Diagnosis not present

## 2021-09-20 ENCOUNTER — Other Ambulatory Visit (HOSPITAL_COMMUNITY): Payer: Self-pay

## 2021-09-22 DIAGNOSIS — Z952 Presence of prosthetic heart valve: Secondary | ICD-10-CM | POA: Diagnosis not present

## 2021-09-22 DIAGNOSIS — Q8740 Marfan's syndrome, unspecified: Secondary | ICD-10-CM | POA: Diagnosis not present

## 2021-09-22 DIAGNOSIS — G4733 Obstructive sleep apnea (adult) (pediatric): Secondary | ICD-10-CM | POA: Diagnosis not present

## 2021-09-22 DIAGNOSIS — Z7901 Long term (current) use of anticoagulants: Secondary | ICD-10-CM | POA: Diagnosis not present

## 2021-09-23 ENCOUNTER — Other Ambulatory Visit (HOSPITAL_COMMUNITY): Payer: Self-pay

## 2021-09-24 DIAGNOSIS — Z952 Presence of prosthetic heart valve: Secondary | ICD-10-CM | POA: Diagnosis not present

## 2021-09-24 DIAGNOSIS — R200 Anesthesia of skin: Secondary | ICD-10-CM | POA: Diagnosis not present

## 2021-09-25 DIAGNOSIS — Z952 Presence of prosthetic heart valve: Secondary | ICD-10-CM | POA: Diagnosis not present

## 2021-09-25 DIAGNOSIS — Z48812 Encounter for surgical aftercare following surgery on the circulatory system: Secondary | ICD-10-CM | POA: Diagnosis not present

## 2021-09-26 DIAGNOSIS — Z952 Presence of prosthetic heart valve: Secondary | ICD-10-CM | POA: Diagnosis not present

## 2021-09-26 DIAGNOSIS — Z48812 Encounter for surgical aftercare following surgery on the circulatory system: Secondary | ICD-10-CM | POA: Diagnosis not present

## 2021-09-30 DIAGNOSIS — Z48812 Encounter for surgical aftercare following surgery on the circulatory system: Secondary | ICD-10-CM | POA: Diagnosis not present

## 2021-09-30 DIAGNOSIS — Z952 Presence of prosthetic heart valve: Secondary | ICD-10-CM | POA: Diagnosis not present

## 2021-10-06 DIAGNOSIS — Z7901 Long term (current) use of anticoagulants: Secondary | ICD-10-CM | POA: Diagnosis not present

## 2021-10-06 DIAGNOSIS — Q8740 Marfan's syndrome, unspecified: Secondary | ICD-10-CM | POA: Diagnosis not present

## 2021-10-06 DIAGNOSIS — R791 Abnormal coagulation profile: Secondary | ICD-10-CM | POA: Diagnosis not present

## 2021-10-06 DIAGNOSIS — Z952 Presence of prosthetic heart valve: Secondary | ICD-10-CM | POA: Diagnosis not present

## 2021-10-13 DIAGNOSIS — N939 Abnormal uterine and vaginal bleeding, unspecified: Secondary | ICD-10-CM | POA: Diagnosis not present

## 2021-10-14 DIAGNOSIS — F411 Generalized anxiety disorder: Secondary | ICD-10-CM | POA: Diagnosis not present

## 2021-10-14 DIAGNOSIS — F4310 Post-traumatic stress disorder, unspecified: Secondary | ICD-10-CM | POA: Diagnosis not present

## 2021-10-14 DIAGNOSIS — G4733 Obstructive sleep apnea (adult) (pediatric): Secondary | ICD-10-CM | POA: Diagnosis not present

## 2021-10-14 DIAGNOSIS — F4311 Post-traumatic stress disorder, acute: Secondary | ICD-10-CM | POA: Diagnosis not present

## 2021-10-20 DIAGNOSIS — Q8740 Marfan's syndrome, unspecified: Secondary | ICD-10-CM | POA: Diagnosis not present

## 2021-10-20 DIAGNOSIS — G4733 Obstructive sleep apnea (adult) (pediatric): Secondary | ICD-10-CM | POA: Diagnosis not present

## 2021-10-20 DIAGNOSIS — Z7901 Long term (current) use of anticoagulants: Secondary | ICD-10-CM | POA: Diagnosis not present

## 2021-10-20 DIAGNOSIS — Z952 Presence of prosthetic heart valve: Secondary | ICD-10-CM | POA: Diagnosis not present

## 2021-10-22 DIAGNOSIS — N926 Irregular menstruation, unspecified: Secondary | ICD-10-CM | POA: Diagnosis not present

## 2021-10-23 ENCOUNTER — Other Ambulatory Visit (HOSPITAL_COMMUNITY): Payer: Self-pay

## 2021-10-23 DIAGNOSIS — G4733 Obstructive sleep apnea (adult) (pediatric): Secondary | ICD-10-CM | POA: Diagnosis not present

## 2021-10-30 ENCOUNTER — Other Ambulatory Visit (HOSPITAL_COMMUNITY): Payer: Self-pay

## 2021-10-30 DIAGNOSIS — F4311 Post-traumatic stress disorder, acute: Secondary | ICD-10-CM | POA: Diagnosis not present

## 2021-10-30 DIAGNOSIS — F4310 Post-traumatic stress disorder, unspecified: Secondary | ICD-10-CM | POA: Diagnosis not present

## 2021-10-30 DIAGNOSIS — F411 Generalized anxiety disorder: Secondary | ICD-10-CM | POA: Diagnosis not present

## 2021-10-30 MED ORDER — SERTRALINE HCL 50 MG PO TABS
ORAL_TABLET | ORAL | 0 refills | Status: DC
  Filled 2021-10-30: qty 30, 30d supply, fill #0

## 2021-11-06 ENCOUNTER — Other Ambulatory Visit (HOSPITAL_COMMUNITY): Payer: Self-pay

## 2021-11-06 DIAGNOSIS — I519 Heart disease, unspecified: Secondary | ICD-10-CM | POA: Diagnosis not present

## 2021-11-10 DIAGNOSIS — Z952 Presence of prosthetic heart valve: Secondary | ICD-10-CM | POA: Diagnosis not present

## 2021-11-10 DIAGNOSIS — Z7901 Long term (current) use of anticoagulants: Secondary | ICD-10-CM | POA: Diagnosis not present

## 2021-11-10 DIAGNOSIS — Q8740 Marfan's syndrome, unspecified: Secondary | ICD-10-CM | POA: Diagnosis not present

## 2021-11-17 DIAGNOSIS — Z7901 Long term (current) use of anticoagulants: Secondary | ICD-10-CM | POA: Diagnosis not present

## 2021-11-17 DIAGNOSIS — N926 Irregular menstruation, unspecified: Secondary | ICD-10-CM | POA: Diagnosis not present

## 2021-11-18 DIAGNOSIS — F4311 Post-traumatic stress disorder, acute: Secondary | ICD-10-CM | POA: Diagnosis not present

## 2021-11-18 DIAGNOSIS — F411 Generalized anxiety disorder: Secondary | ICD-10-CM | POA: Diagnosis not present

## 2021-11-27 ENCOUNTER — Other Ambulatory Visit (HOSPITAL_COMMUNITY): Payer: Self-pay

## 2021-11-27 DIAGNOSIS — F4311 Post-traumatic stress disorder, acute: Secondary | ICD-10-CM | POA: Diagnosis not present

## 2021-11-27 DIAGNOSIS — F411 Generalized anxiety disorder: Secondary | ICD-10-CM | POA: Diagnosis not present

## 2021-11-27 DIAGNOSIS — F4310 Post-traumatic stress disorder, unspecified: Secondary | ICD-10-CM | POA: Diagnosis not present

## 2021-11-27 MED ORDER — SERTRALINE HCL 50 MG PO TABS
ORAL_TABLET | ORAL | 0 refills | Status: DC
  Filled 2021-11-27: qty 45, 30d supply, fill #0

## 2021-12-08 ENCOUNTER — Other Ambulatory Visit (HOSPITAL_COMMUNITY): Payer: Self-pay

## 2021-12-08 DIAGNOSIS — Z7901 Long term (current) use of anticoagulants: Secondary | ICD-10-CM | POA: Diagnosis not present

## 2021-12-08 DIAGNOSIS — Z952 Presence of prosthetic heart valve: Secondary | ICD-10-CM | POA: Diagnosis not present

## 2021-12-08 DIAGNOSIS — Q8740 Marfan's syndrome, unspecified: Secondary | ICD-10-CM | POA: Diagnosis not present

## 2021-12-08 MED ORDER — WARFARIN SODIUM 5 MG PO TABS
ORAL_TABLET | ORAL | 3 refills | Status: AC
  Filled 2021-12-08: qty 180, 90d supply, fill #0
  Filled 2022-04-01: qty 180, 90d supply, fill #1

## 2021-12-17 ENCOUNTER — Other Ambulatory Visit (HOSPITAL_COMMUNITY): Payer: Self-pay

## 2021-12-18 ENCOUNTER — Other Ambulatory Visit (HOSPITAL_COMMUNITY): Payer: Self-pay

## 2021-12-18 MED ORDER — ENTRESTO 24-26 MG PO TABS
1.0000 | ORAL_TABLET | Freq: Two times a day (BID) | ORAL | 3 refills | Status: DC
  Filled 2021-12-18: qty 60, 30d supply, fill #0
  Filled 2022-01-21: qty 60, 30d supply, fill #1
  Filled 2022-02-17: qty 60, 30d supply, fill #2
  Filled 2022-03-22: qty 60, 30d supply, fill #3

## 2021-12-25 ENCOUNTER — Other Ambulatory Visit (HOSPITAL_COMMUNITY): Payer: Self-pay

## 2021-12-25 DIAGNOSIS — F411 Generalized anxiety disorder: Secondary | ICD-10-CM | POA: Diagnosis not present

## 2021-12-25 DIAGNOSIS — F4311 Post-traumatic stress disorder, acute: Secondary | ICD-10-CM | POA: Diagnosis not present

## 2021-12-25 MED ORDER — SERTRALINE HCL 50 MG PO TABS
ORAL_TABLET | ORAL | 0 refills | Status: AC
  Filled 2021-12-25: qty 135, 90d supply, fill #0

## 2022-01-19 DIAGNOSIS — Z7901 Long term (current) use of anticoagulants: Secondary | ICD-10-CM | POA: Diagnosis not present

## 2022-01-19 DIAGNOSIS — Z952 Presence of prosthetic heart valve: Secondary | ICD-10-CM | POA: Diagnosis not present

## 2022-01-19 DIAGNOSIS — Q8740 Marfan's syndrome, unspecified: Secondary | ICD-10-CM | POA: Diagnosis not present

## 2022-01-19 DIAGNOSIS — F411 Generalized anxiety disorder: Secondary | ICD-10-CM | POA: Diagnosis not present

## 2022-01-19 DIAGNOSIS — F4311 Post-traumatic stress disorder, acute: Secondary | ICD-10-CM | POA: Diagnosis not present

## 2022-01-21 ENCOUNTER — Other Ambulatory Visit (HOSPITAL_COMMUNITY): Payer: Self-pay

## 2022-01-23 DIAGNOSIS — G4733 Obstructive sleep apnea (adult) (pediatric): Secondary | ICD-10-CM | POA: Diagnosis not present

## 2022-01-29 DIAGNOSIS — F4311 Post-traumatic stress disorder, acute: Secondary | ICD-10-CM | POA: Diagnosis not present

## 2022-01-29 DIAGNOSIS — F411 Generalized anxiety disorder: Secondary | ICD-10-CM | POA: Diagnosis not present

## 2022-02-05 DIAGNOSIS — F411 Generalized anxiety disorder: Secondary | ICD-10-CM | POA: Diagnosis not present

## 2022-02-05 DIAGNOSIS — F4311 Post-traumatic stress disorder, acute: Secondary | ICD-10-CM | POA: Diagnosis not present

## 2022-02-06 ENCOUNTER — Other Ambulatory Visit (HOSPITAL_COMMUNITY): Payer: Self-pay

## 2022-02-06 DIAGNOSIS — F411 Generalized anxiety disorder: Secondary | ICD-10-CM | POA: Diagnosis not present

## 2022-02-06 DIAGNOSIS — F4311 Post-traumatic stress disorder, acute: Secondary | ICD-10-CM | POA: Diagnosis not present

## 2022-02-13 DIAGNOSIS — F411 Generalized anxiety disorder: Secondary | ICD-10-CM | POA: Diagnosis not present

## 2022-02-13 DIAGNOSIS — F4311 Post-traumatic stress disorder, acute: Secondary | ICD-10-CM | POA: Diagnosis not present

## 2022-02-17 ENCOUNTER — Other Ambulatory Visit (HOSPITAL_COMMUNITY): Payer: Self-pay

## 2022-02-17 DIAGNOSIS — I480 Paroxysmal atrial fibrillation: Secondary | ICD-10-CM | POA: Diagnosis not present

## 2022-02-17 DIAGNOSIS — Q8740 Marfan's syndrome, unspecified: Secondary | ICD-10-CM | POA: Diagnosis not present

## 2022-02-17 DIAGNOSIS — I4510 Unspecified right bundle-branch block: Secondary | ICD-10-CM | POA: Diagnosis not present

## 2022-02-17 DIAGNOSIS — R001 Bradycardia, unspecified: Secondary | ICD-10-CM | POA: Diagnosis not present

## 2022-02-17 DIAGNOSIS — Z952 Presence of prosthetic heart valve: Secondary | ICD-10-CM | POA: Diagnosis not present

## 2022-02-17 DIAGNOSIS — Z7901 Long term (current) use of anticoagulants: Secondary | ICD-10-CM | POA: Diagnosis not present

## 2022-02-17 DIAGNOSIS — I493 Ventricular premature depolarization: Secondary | ICD-10-CM | POA: Diagnosis not present

## 2022-02-17 DIAGNOSIS — I4892 Unspecified atrial flutter: Secondary | ICD-10-CM | POA: Diagnosis not present

## 2022-02-20 DIAGNOSIS — F4311 Post-traumatic stress disorder, acute: Secondary | ICD-10-CM | POA: Diagnosis not present

## 2022-02-20 DIAGNOSIS — F411 Generalized anxiety disorder: Secondary | ICD-10-CM | POA: Diagnosis not present

## 2022-02-27 DIAGNOSIS — F411 Generalized anxiety disorder: Secondary | ICD-10-CM | POA: Diagnosis not present

## 2022-02-27 DIAGNOSIS — F4311 Post-traumatic stress disorder, acute: Secondary | ICD-10-CM | POA: Diagnosis not present

## 2022-03-02 DIAGNOSIS — Q8740 Marfan's syndrome, unspecified: Secondary | ICD-10-CM | POA: Diagnosis not present

## 2022-03-02 DIAGNOSIS — Z952 Presence of prosthetic heart valve: Secondary | ICD-10-CM | POA: Diagnosis not present

## 2022-03-02 DIAGNOSIS — Z7901 Long term (current) use of anticoagulants: Secondary | ICD-10-CM | POA: Diagnosis not present

## 2022-03-06 DIAGNOSIS — F4311 Post-traumatic stress disorder, acute: Secondary | ICD-10-CM | POA: Diagnosis not present

## 2022-03-06 DIAGNOSIS — F411 Generalized anxiety disorder: Secondary | ICD-10-CM | POA: Diagnosis not present

## 2022-03-20 DIAGNOSIS — F411 Generalized anxiety disorder: Secondary | ICD-10-CM | POA: Diagnosis not present

## 2022-03-20 DIAGNOSIS — F4311 Post-traumatic stress disorder, acute: Secondary | ICD-10-CM | POA: Diagnosis not present

## 2022-03-23 ENCOUNTER — Other Ambulatory Visit (HOSPITAL_BASED_OUTPATIENT_CLINIC_OR_DEPARTMENT_OTHER): Payer: Self-pay

## 2022-04-01 ENCOUNTER — Other Ambulatory Visit (HOSPITAL_COMMUNITY): Payer: Self-pay

## 2022-04-01 DIAGNOSIS — F411 Generalized anxiety disorder: Secondary | ICD-10-CM | POA: Diagnosis not present

## 2022-04-01 DIAGNOSIS — F4311 Post-traumatic stress disorder, acute: Secondary | ICD-10-CM | POA: Diagnosis not present

## 2022-04-01 MED ORDER — SERTRALINE HCL 50 MG PO TABS
75.0000 mg | ORAL_TABLET | Freq: Every day | ORAL | 0 refills | Status: DC
  Filled 2022-04-01: qty 135, 90d supply, fill #0

## 2022-04-03 DIAGNOSIS — F411 Generalized anxiety disorder: Secondary | ICD-10-CM | POA: Diagnosis not present

## 2022-04-03 DIAGNOSIS — F4311 Post-traumatic stress disorder, acute: Secondary | ICD-10-CM | POA: Diagnosis not present

## 2022-04-09 ENCOUNTER — Other Ambulatory Visit (HOSPITAL_COMMUNITY): Payer: Self-pay

## 2022-04-10 DIAGNOSIS — F411 Generalized anxiety disorder: Secondary | ICD-10-CM | POA: Diagnosis not present

## 2022-04-10 DIAGNOSIS — F4311 Post-traumatic stress disorder, acute: Secondary | ICD-10-CM | POA: Diagnosis not present

## 2022-04-13 DIAGNOSIS — Z7901 Long term (current) use of anticoagulants: Secondary | ICD-10-CM | POA: Diagnosis not present

## 2022-04-13 DIAGNOSIS — Q8740 Marfan's syndrome, unspecified: Secondary | ICD-10-CM | POA: Diagnosis not present

## 2022-04-13 DIAGNOSIS — R791 Abnormal coagulation profile: Secondary | ICD-10-CM | POA: Diagnosis not present

## 2022-04-13 DIAGNOSIS — Z952 Presence of prosthetic heart valve: Secondary | ICD-10-CM | POA: Diagnosis not present

## 2022-04-17 DIAGNOSIS — F411 Generalized anxiety disorder: Secondary | ICD-10-CM | POA: Diagnosis not present

## 2022-04-17 DIAGNOSIS — F4311 Post-traumatic stress disorder, acute: Secondary | ICD-10-CM | POA: Diagnosis not present

## 2022-04-21 ENCOUNTER — Other Ambulatory Visit (HOSPITAL_COMMUNITY): Payer: Self-pay

## 2022-04-21 MED ORDER — ENTRESTO 24-26 MG PO TABS
1.0000 | ORAL_TABLET | Freq: Two times a day (BID) | ORAL | 11 refills | Status: DC
Start: 2022-04-21 — End: 2023-04-28
  Filled 2022-04-21: qty 60, 30d supply, fill #0
  Filled 2022-05-21: qty 60, 30d supply, fill #1
  Filled 2022-06-22: qty 60, 30d supply, fill #2
  Filled 2022-07-23: qty 60, 30d supply, fill #3
  Filled 2022-08-17: qty 60, 30d supply, fill #4
  Filled 2022-09-24: qty 60, 30d supply, fill #5
  Filled 2022-10-22: qty 60, 30d supply, fill #6
  Filled 2022-11-23: qty 60, 30d supply, fill #7
  Filled 2022-12-21: qty 60, 30d supply, fill #8
  Filled 2023-01-22: qty 60, 30d supply, fill #9
  Filled 2023-02-23: qty 60, 30d supply, fill #10
  Filled 2023-03-17: qty 60, 30d supply, fill #11

## 2022-04-28 DIAGNOSIS — G4733 Obstructive sleep apnea (adult) (pediatric): Secondary | ICD-10-CM | POA: Diagnosis not present

## 2022-05-01 DIAGNOSIS — F411 Generalized anxiety disorder: Secondary | ICD-10-CM | POA: Diagnosis not present

## 2022-05-11 ENCOUNTER — Other Ambulatory Visit (HOSPITAL_COMMUNITY): Payer: Self-pay

## 2022-05-11 DIAGNOSIS — Q8740 Marfan's syndrome, unspecified: Secondary | ICD-10-CM | POA: Diagnosis not present

## 2022-05-11 DIAGNOSIS — Z7901 Long term (current) use of anticoagulants: Secondary | ICD-10-CM | POA: Diagnosis not present

## 2022-05-11 DIAGNOSIS — R791 Abnormal coagulation profile: Secondary | ICD-10-CM | POA: Diagnosis not present

## 2022-05-11 DIAGNOSIS — Z952 Presence of prosthetic heart valve: Secondary | ICD-10-CM | POA: Diagnosis not present

## 2022-05-18 DIAGNOSIS — Q8740 Marfan's syndrome, unspecified: Secondary | ICD-10-CM | POA: Diagnosis not present

## 2022-05-18 DIAGNOSIS — R791 Abnormal coagulation profile: Secondary | ICD-10-CM | POA: Diagnosis not present

## 2022-05-18 DIAGNOSIS — Z952 Presence of prosthetic heart valve: Secondary | ICD-10-CM | POA: Diagnosis not present

## 2022-05-18 DIAGNOSIS — Z7901 Long term (current) use of anticoagulants: Secondary | ICD-10-CM | POA: Diagnosis not present

## 2022-05-21 ENCOUNTER — Other Ambulatory Visit (HOSPITAL_COMMUNITY): Payer: Self-pay

## 2022-05-22 ENCOUNTER — Other Ambulatory Visit (HOSPITAL_COMMUNITY): Payer: Self-pay

## 2022-05-27 DIAGNOSIS — Q8740 Marfan's syndrome, unspecified: Secondary | ICD-10-CM | POA: Diagnosis not present

## 2022-05-27 DIAGNOSIS — Z7901 Long term (current) use of anticoagulants: Secondary | ICD-10-CM | POA: Diagnosis not present

## 2022-05-27 DIAGNOSIS — R791 Abnormal coagulation profile: Secondary | ICD-10-CM | POA: Diagnosis not present

## 2022-05-27 DIAGNOSIS — Z952 Presence of prosthetic heart valve: Secondary | ICD-10-CM | POA: Diagnosis not present

## 2022-05-29 DIAGNOSIS — F411 Generalized anxiety disorder: Secondary | ICD-10-CM | POA: Diagnosis not present

## 2022-06-09 DIAGNOSIS — Z7901 Long term (current) use of anticoagulants: Secondary | ICD-10-CM | POA: Diagnosis not present

## 2022-06-09 DIAGNOSIS — Q8740 Marfan's syndrome, unspecified: Secondary | ICD-10-CM | POA: Diagnosis not present

## 2022-06-09 DIAGNOSIS — Z952 Presence of prosthetic heart valve: Secondary | ICD-10-CM | POA: Diagnosis not present

## 2022-06-22 ENCOUNTER — Other Ambulatory Visit (HOSPITAL_COMMUNITY): Payer: Self-pay

## 2022-06-22 DIAGNOSIS — Z952 Presence of prosthetic heart valve: Secondary | ICD-10-CM | POA: Diagnosis not present

## 2022-06-22 DIAGNOSIS — Z7901 Long term (current) use of anticoagulants: Secondary | ICD-10-CM | POA: Diagnosis not present

## 2022-06-22 DIAGNOSIS — Q8740 Marfan's syndrome, unspecified: Secondary | ICD-10-CM | POA: Diagnosis not present

## 2022-06-24 ENCOUNTER — Other Ambulatory Visit (HOSPITAL_COMMUNITY): Payer: Self-pay

## 2022-06-24 DIAGNOSIS — F411 Generalized anxiety disorder: Secondary | ICD-10-CM | POA: Diagnosis not present

## 2022-06-24 MED ORDER — SERTRALINE HCL 50 MG PO TABS
75.0000 mg | ORAL_TABLET | Freq: Every day | ORAL | 0 refills | Status: AC
Start: 2022-06-24 — End: ?
  Filled 2022-06-24: qty 135, 90d supply, fill #0

## 2022-07-20 ENCOUNTER — Other Ambulatory Visit (HOSPITAL_COMMUNITY): Payer: Self-pay

## 2022-07-20 DIAGNOSIS — R791 Abnormal coagulation profile: Secondary | ICD-10-CM | POA: Diagnosis not present

## 2022-07-20 DIAGNOSIS — Q8740 Marfan's syndrome, unspecified: Secondary | ICD-10-CM | POA: Diagnosis not present

## 2022-07-20 DIAGNOSIS — Z952 Presence of prosthetic heart valve: Secondary | ICD-10-CM | POA: Diagnosis not present

## 2022-07-20 DIAGNOSIS — Z7901 Long term (current) use of anticoagulants: Secondary | ICD-10-CM | POA: Diagnosis not present

## 2022-07-20 MED ORDER — WARFARIN SODIUM 5 MG PO TABS
7.5000 mg | ORAL_TABLET | Freq: Every day | ORAL | 3 refills | Status: AC
  Filled 2022-07-20: qty 135, 90d supply, fill #0

## 2022-07-23 ENCOUNTER — Other Ambulatory Visit: Payer: Self-pay

## 2022-07-28 DIAGNOSIS — G4733 Obstructive sleep apnea (adult) (pediatric): Secondary | ICD-10-CM | POA: Diagnosis not present

## 2022-07-31 DIAGNOSIS — Z1231 Encounter for screening mammogram for malignant neoplasm of breast: Secondary | ICD-10-CM | POA: Diagnosis not present

## 2022-08-04 DIAGNOSIS — R791 Abnormal coagulation profile: Secondary | ICD-10-CM | POA: Diagnosis not present

## 2022-08-04 DIAGNOSIS — Z7901 Long term (current) use of anticoagulants: Secondary | ICD-10-CM | POA: Diagnosis not present

## 2022-08-04 DIAGNOSIS — Q8740 Marfan's syndrome, unspecified: Secondary | ICD-10-CM | POA: Diagnosis not present

## 2022-08-04 DIAGNOSIS — Z952 Presence of prosthetic heart valve: Secondary | ICD-10-CM | POA: Diagnosis not present

## 2022-08-06 DIAGNOSIS — I519 Heart disease, unspecified: Secondary | ICD-10-CM | POA: Diagnosis not present

## 2022-08-06 DIAGNOSIS — G4733 Obstructive sleep apnea (adult) (pediatric): Secondary | ICD-10-CM | POA: Diagnosis not present

## 2022-08-06 DIAGNOSIS — I509 Heart failure, unspecified: Secondary | ICD-10-CM | POA: Diagnosis not present

## 2022-08-06 DIAGNOSIS — Q87410 Marfan's syndrome with aortic dilation: Secondary | ICD-10-CM | POA: Diagnosis not present

## 2022-08-06 DIAGNOSIS — Z952 Presence of prosthetic heart valve: Secondary | ICD-10-CM | POA: Diagnosis not present

## 2022-08-06 DIAGNOSIS — I9789 Other postprocedural complications and disorders of the circulatory system, not elsewhere classified: Secondary | ICD-10-CM | POA: Diagnosis not present

## 2022-08-06 DIAGNOSIS — Z79899 Other long term (current) drug therapy: Secondary | ICD-10-CM | POA: Diagnosis not present

## 2022-08-10 ENCOUNTER — Other Ambulatory Visit (HOSPITAL_COMMUNITY): Payer: Self-pay

## 2022-08-11 ENCOUNTER — Other Ambulatory Visit (HOSPITAL_COMMUNITY): Payer: Self-pay

## 2022-08-11 MED ORDER — METOPROLOL TARTRATE 50 MG PO TABS
50.0000 mg | ORAL_TABLET | Freq: Two times a day (BID) | ORAL | 3 refills | Status: DC
Start: 2022-08-11 — End: 2023-08-09
  Filled 2022-08-11: qty 180, 90d supply, fill #0
  Filled 2022-11-09: qty 180, 90d supply, fill #1
  Filled 2023-02-08: qty 180, 90d supply, fill #2
  Filled 2023-05-07: qty 180, 90d supply, fill #3

## 2022-08-14 IMAGING — DX DG CHEST 1V PORT
1 series · 1 of 1 positions shown · non-contrast
Comparison: Yesterday

CLINICAL DATA: Shortness of breath.  Chest tube

EXAM:
PORTABLE CHEST 1 VIEW

[chest ap]
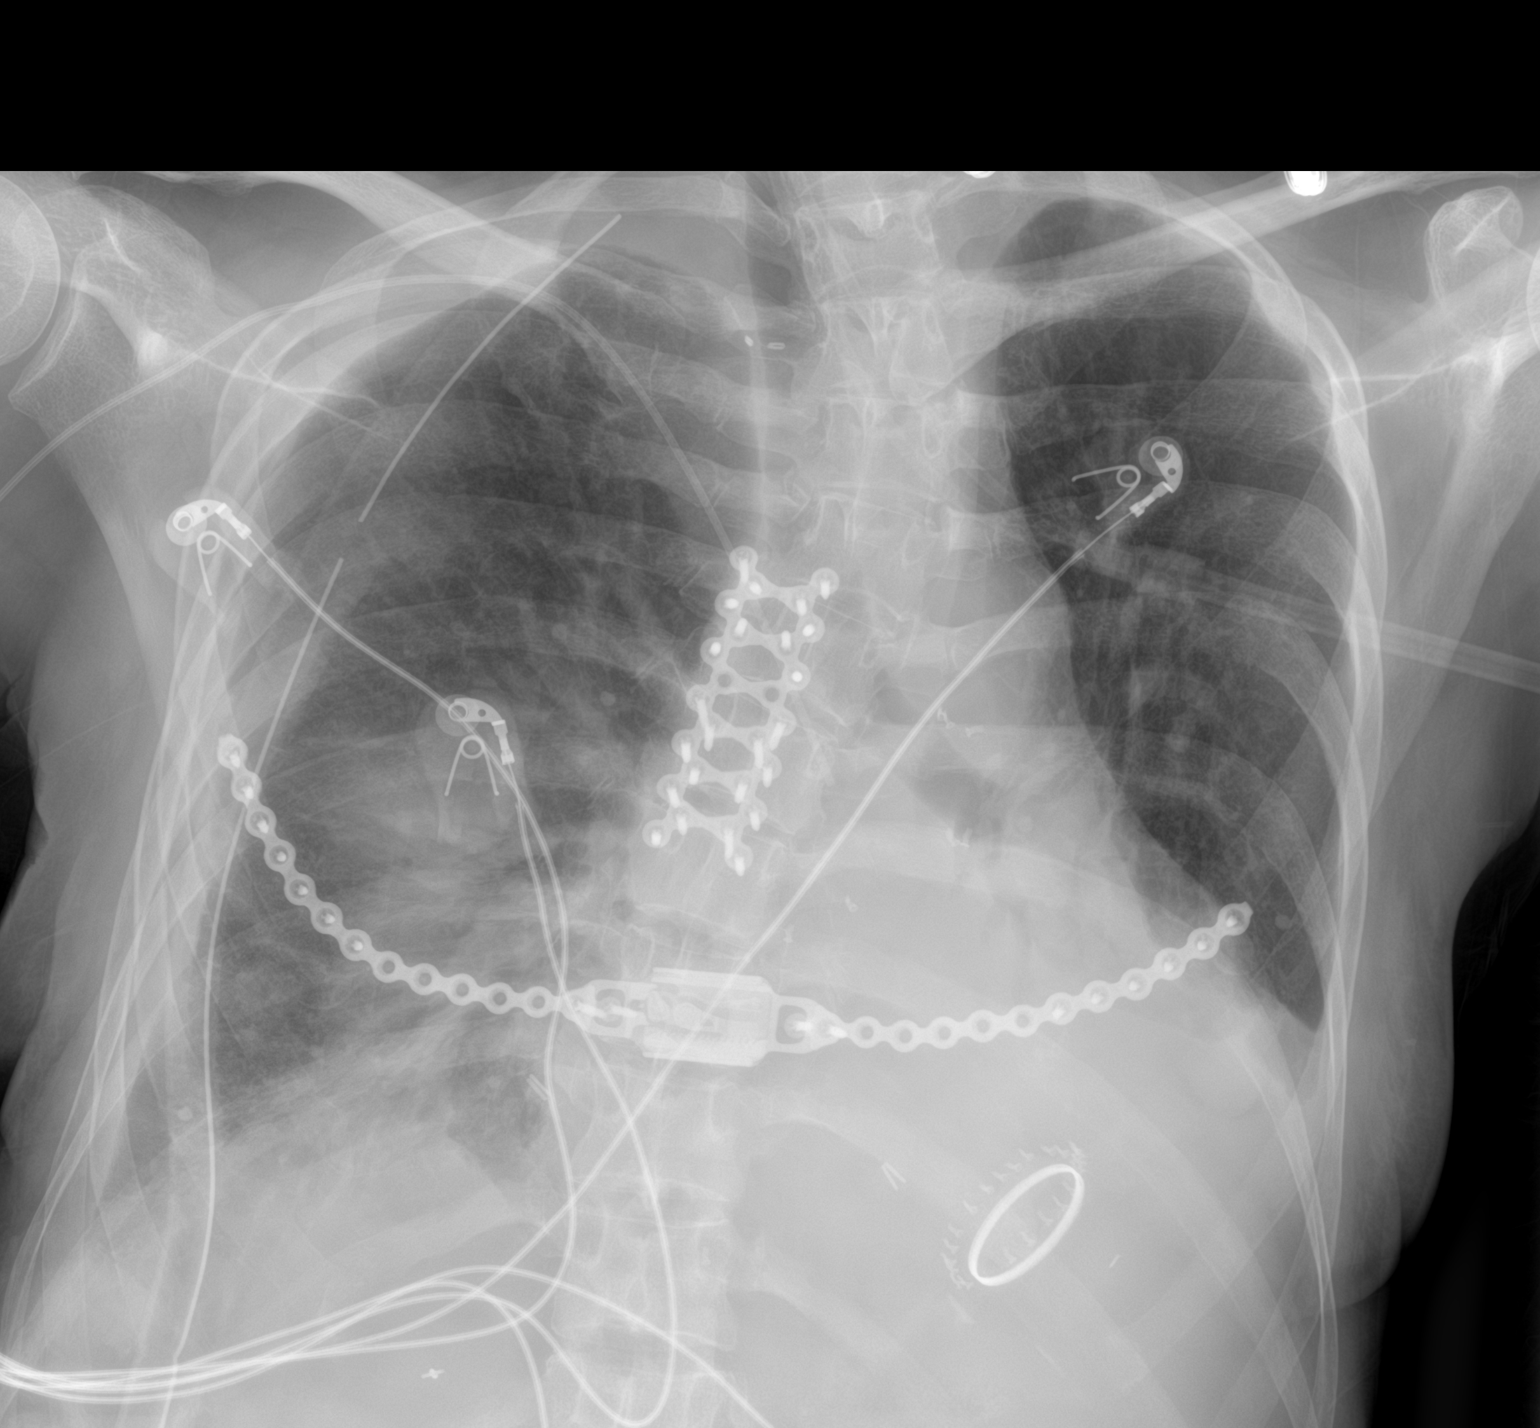

[1 of 1 positions shown; findings below may reference images not displayed]

FINDINGS: The left more than right lower pulmonary opacification with small
right pleural effusion. There was ultrasound pleural fluid
evaluation yesterday. Right chest tube with tip at the apex. No
pneumothorax. Right PICC with tip at the SVC. Postoperative chest
wall and heart. Cardiomegaly.
IMPRESSION: Unchanged since 12/11/2020, as above.

## 2022-08-15 IMAGING — DX DG CHEST 1V PORT
1 series · 1 of 1 positions shown · non-contrast
Comparison: 12/14/2020

CLINICAL DATA: Shortness of breath.

EXAM:
PORTABLE CHEST 1 VIEW

[chest ap]
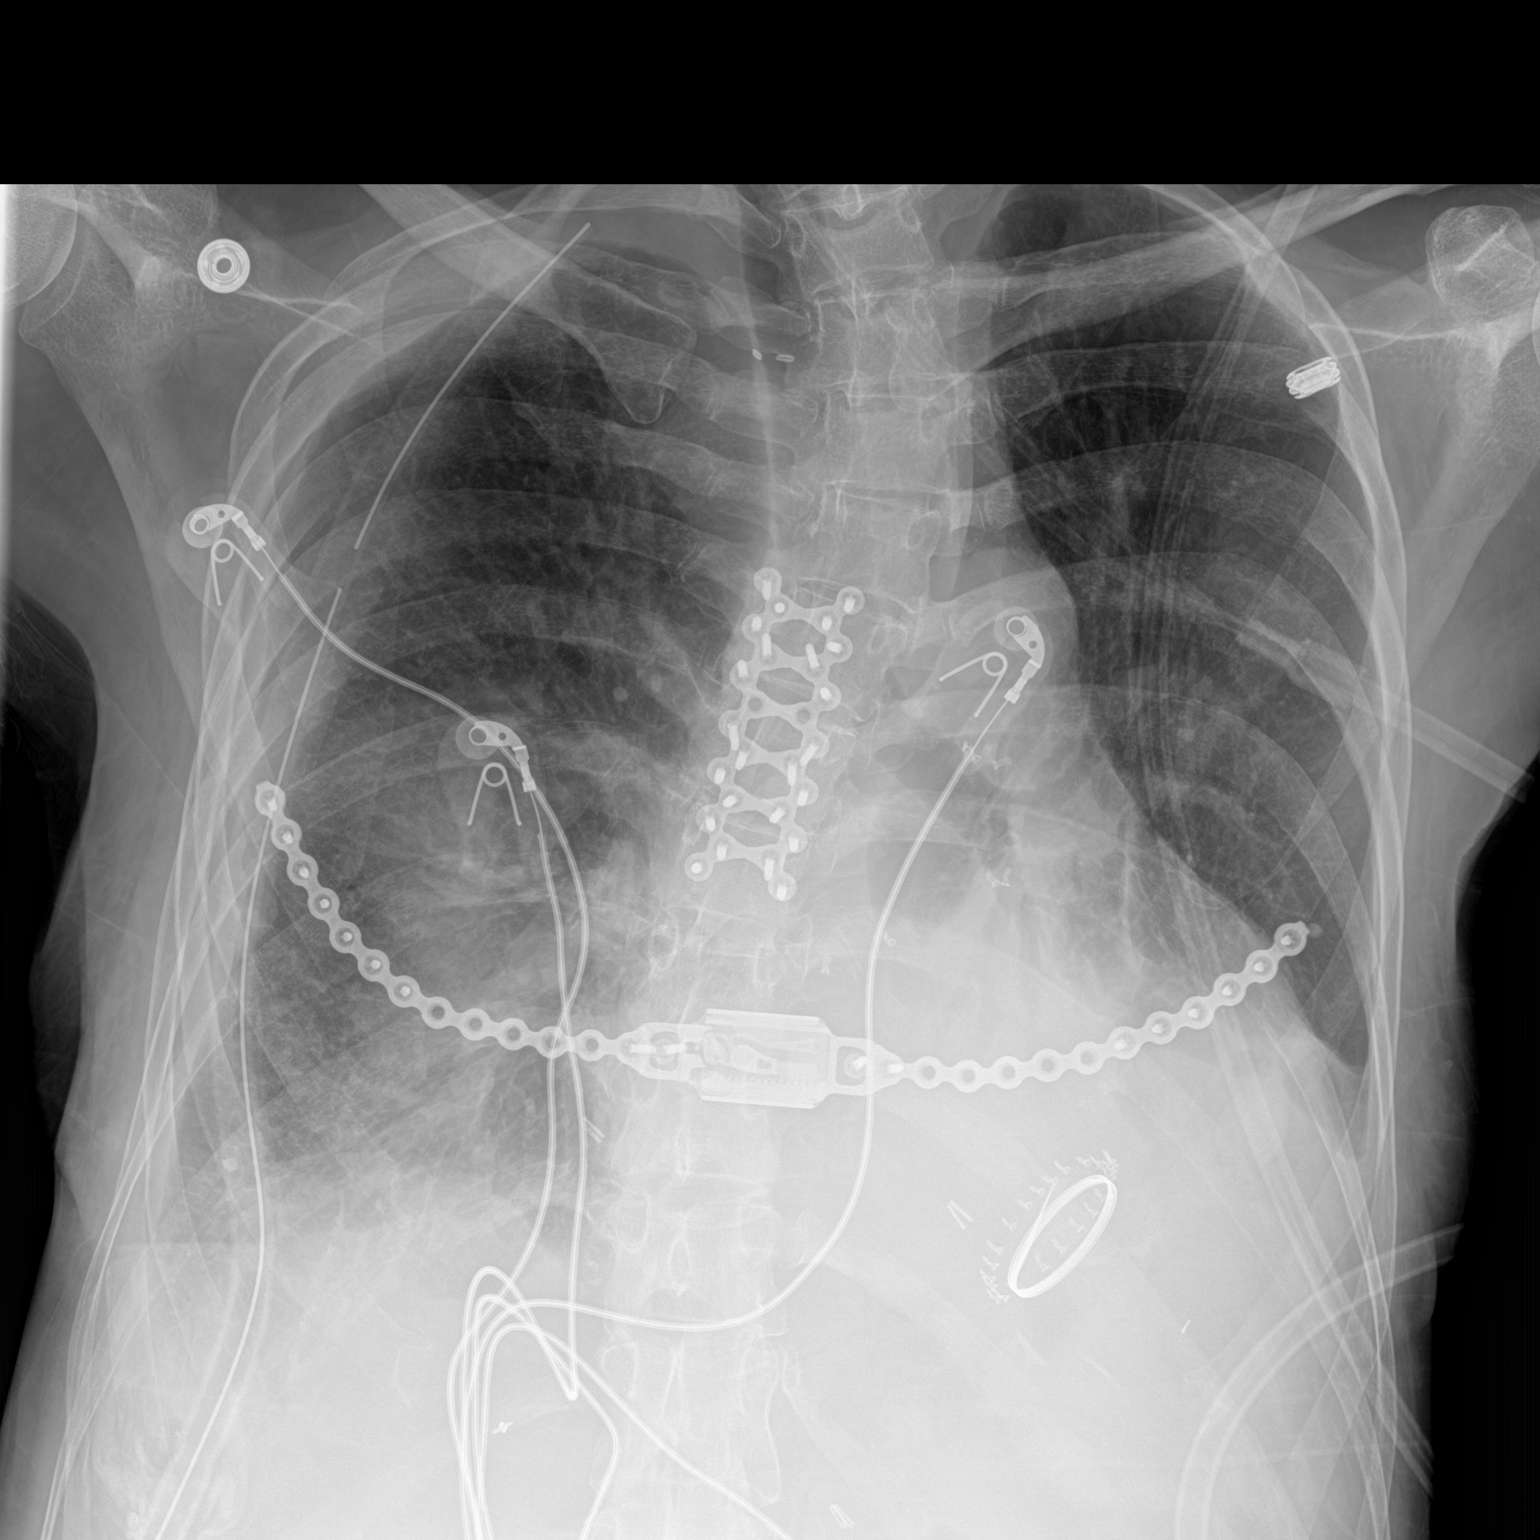

[1 of 1 positions shown; findings below may reference images not displayed]

FINDINGS: Right chest tube remains in place with tip over the right apex.
There is right pleural fluid without visible pneumothorax. The right
PICC line is been removed. Bilateral lower lung zone interstitial
and airspace opacities are unchanged. Stable cardiac enlargement.
Previous median sternotomy and valve repair.
IMPRESSION: 1. No change in aeration to the lungs compared with prior exam.
2. Stable right chest tube. No pneumothorax.

## 2022-08-16 IMAGING — DX DG CHEST 1V PORT
1 series · 1 of 1 positions shown · non-contrast
Comparison: 12/15/2020

CLINICAL DATA: Atelectasis

EXAM:
PORTABLE CHEST 1 VIEW

[chest ap]
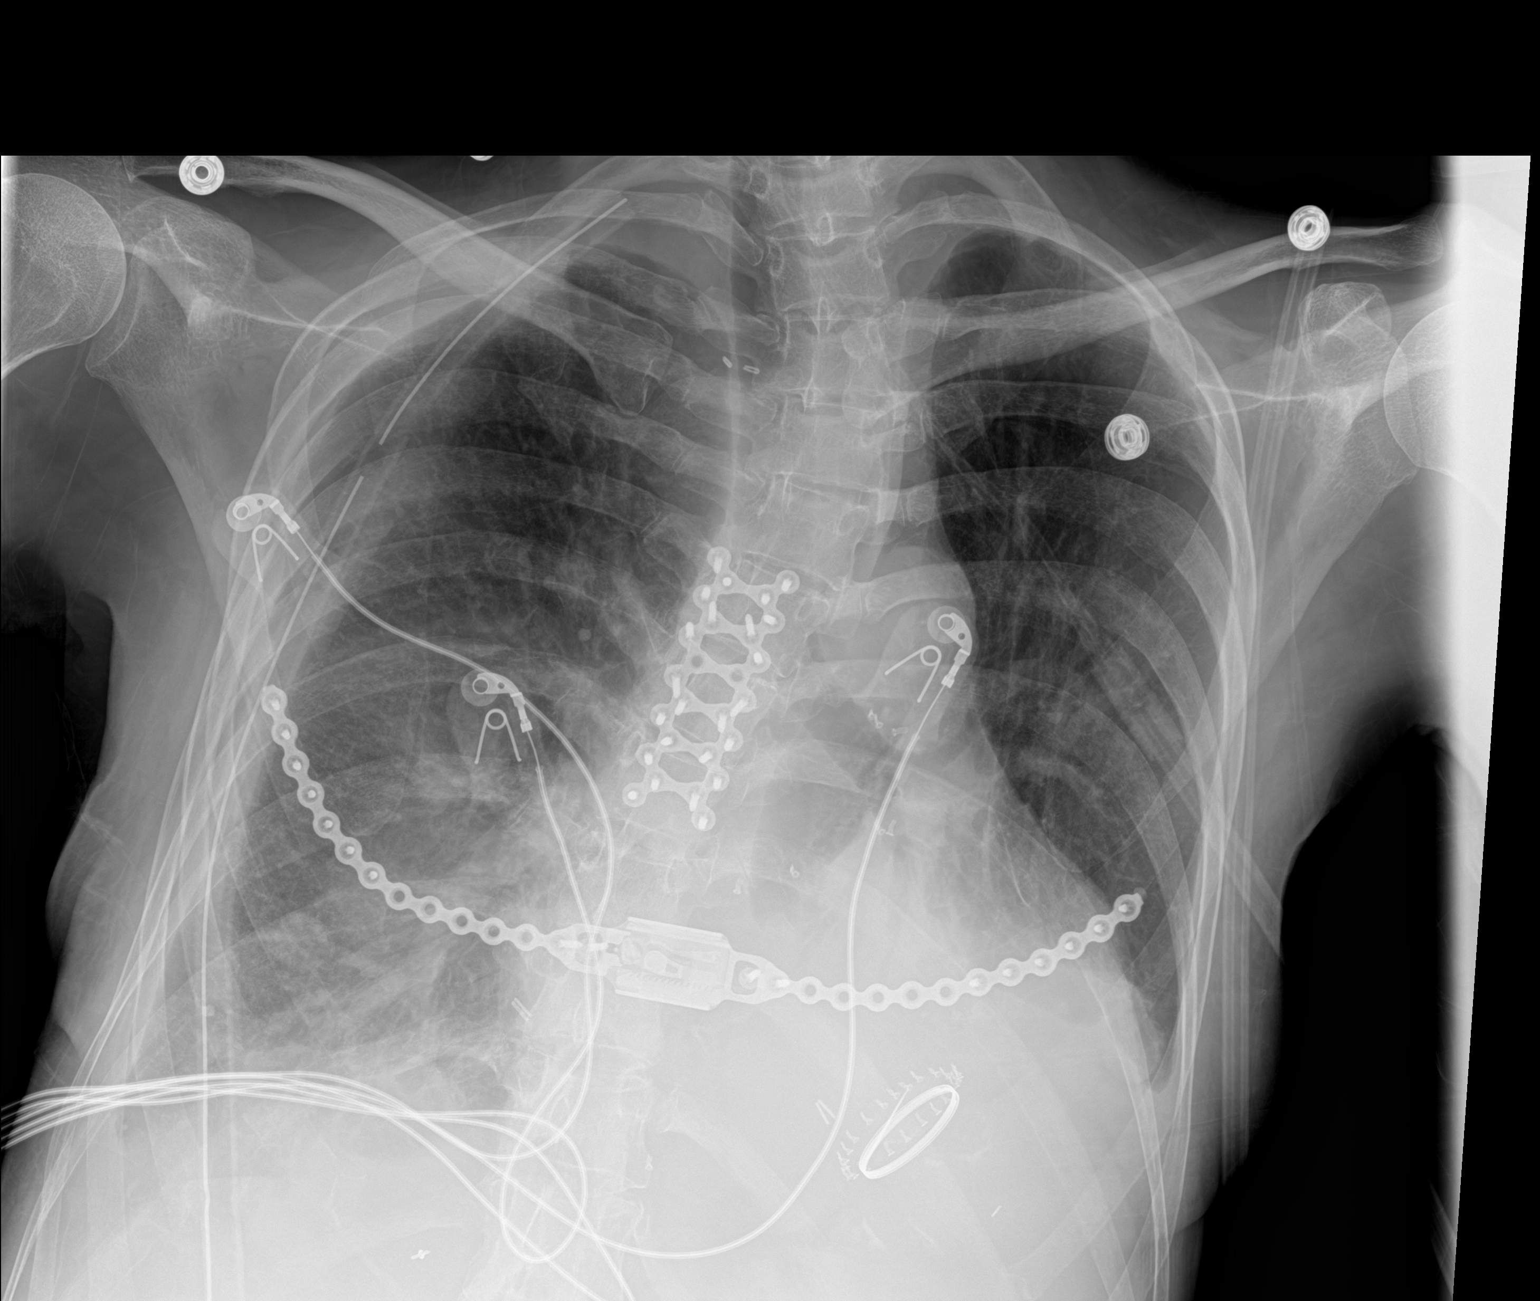

[1 of 1 positions shown; findings below may reference images not displayed]

FINDINGS: Large bore right chest tube is unchanged. No pneumothorax. Small
bilateral pleural effusions persist. Left basilar consolidation is
unchanged. Airspace infiltrate at the right lung base is unchanged.
Median sternotomy has been performed. Cardiac size is mildly
enlarged. Mitral valve replacement has been performed. Pulmonary
vascularity is normal.
IMPRESSION: Right chest tube in place.  No pneumothorax.

Stable left basilar consolidation and right basilar airspace
infiltrate. Stable small associated bilateral pleural effusions.

Unchanged cardiomegaly.

## 2022-08-17 ENCOUNTER — Other Ambulatory Visit (HOSPITAL_COMMUNITY): Payer: Self-pay

## 2022-08-17 IMAGING — DX DG CHEST 1V PORT
1 series · 1 of 1 positions shown · non-contrast
Comparison: 12/16/2020

CLINICAL DATA: Pleural effusion

EXAM:
PORTABLE CHEST 1 VIEW

[chest ap]
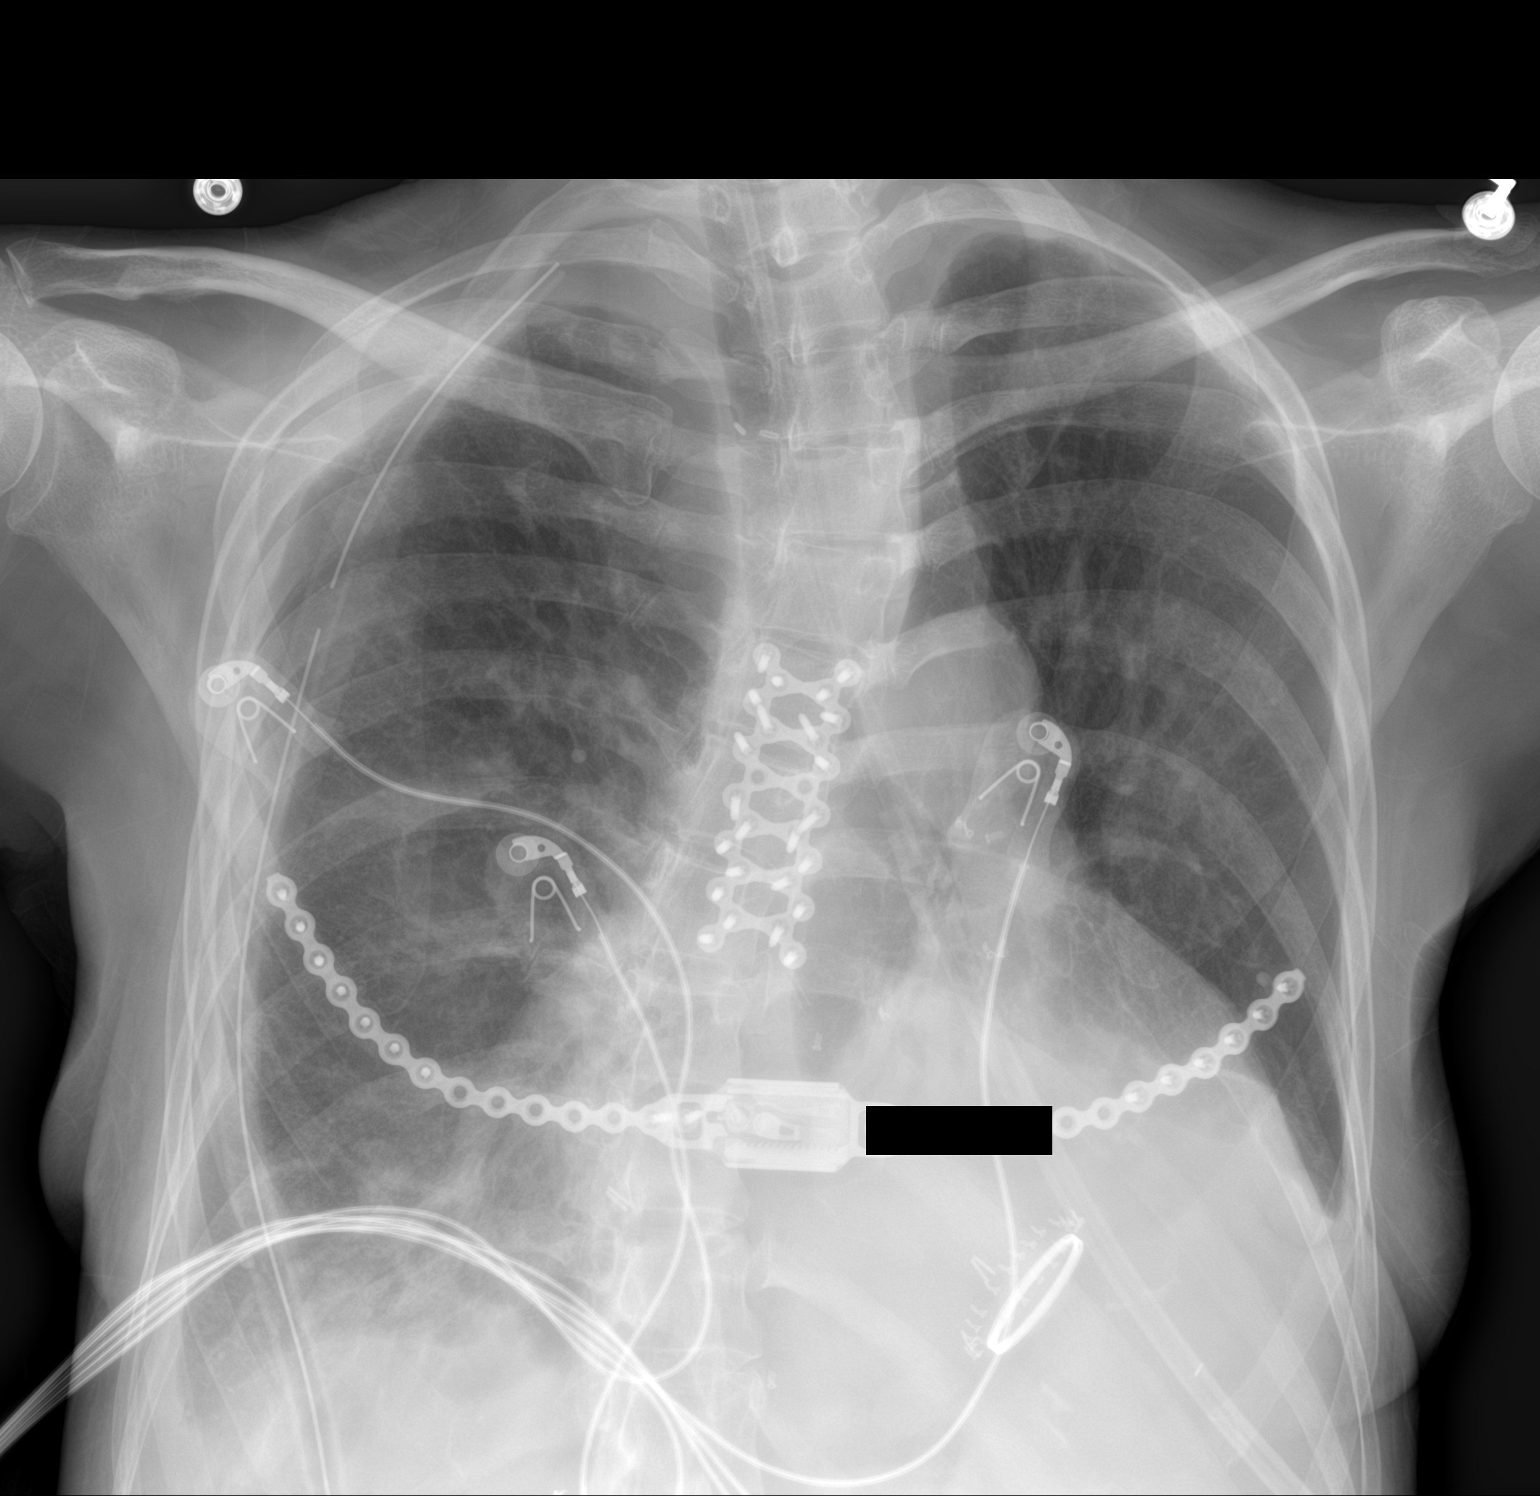

[1 of 1 positions shown; findings below may reference images not displayed]

FINDINGS: Postoperative changes. Cardiomegaly. Small bilateral pleural
effusions noted. Right chest tube remains in place without
pneumothorax. Vascular congestion and bibasilar atelectasis, similar
prior study.
IMPRESSION: Small bilateral pleural effusions and bibasilar atelectasis. No
pneumothorax. No significant change.

## 2022-08-18 DIAGNOSIS — Q8740 Marfan's syndrome, unspecified: Secondary | ICD-10-CM | POA: Diagnosis not present

## 2022-08-18 DIAGNOSIS — Z7901 Long term (current) use of anticoagulants: Secondary | ICD-10-CM | POA: Diagnosis not present

## 2022-08-18 DIAGNOSIS — Z952 Presence of prosthetic heart valve: Secondary | ICD-10-CM | POA: Diagnosis not present

## 2022-08-18 IMAGING — DX DG CHEST 1V PORT
1 series · 2 of 2 positions shown · non-contrast
Comparison: 12/17/2020

CLINICAL DATA: Chest tube removal

EXAM:
PORTABLE CHEST 1 VIEW

[Series 1: chest ap · 0.14mm/px · 2 of 2 slices shown]
[im 1/2]
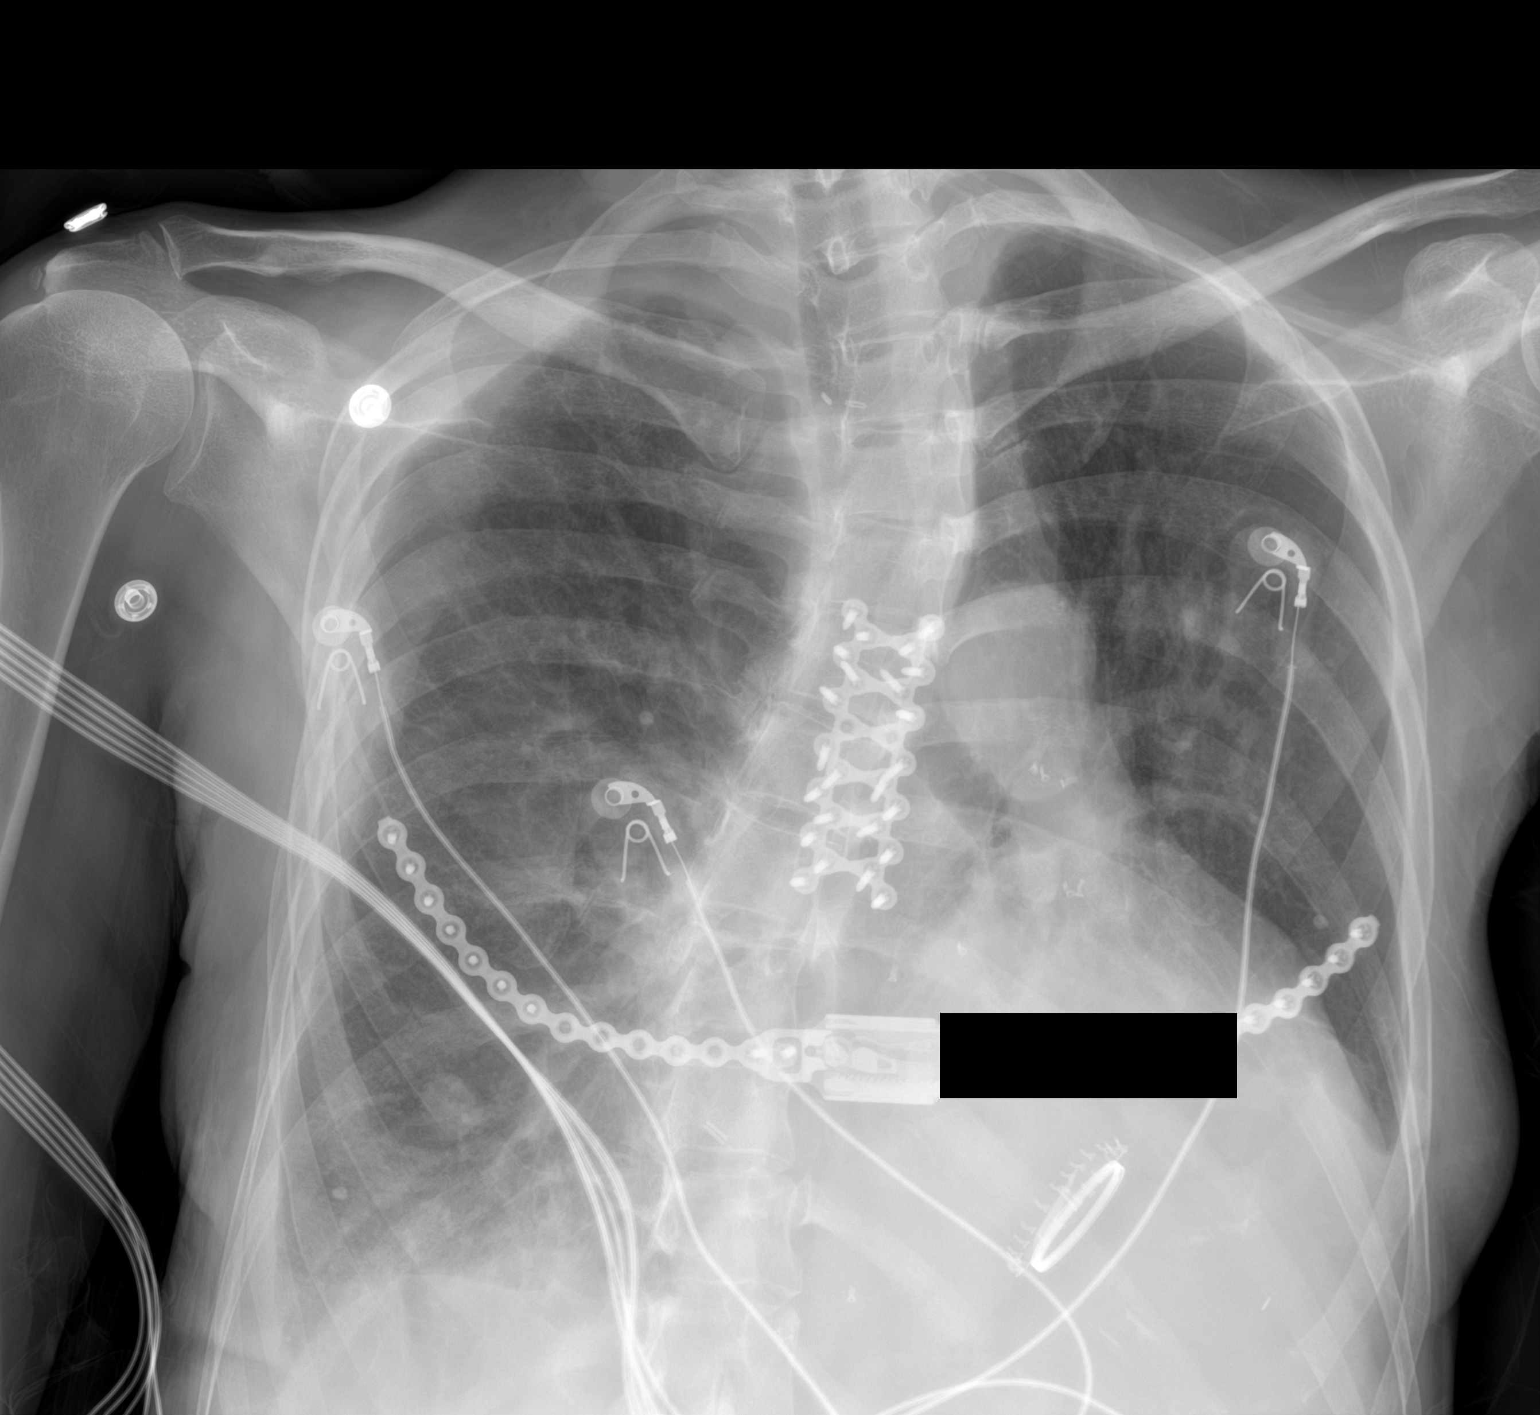
[im 2/2]
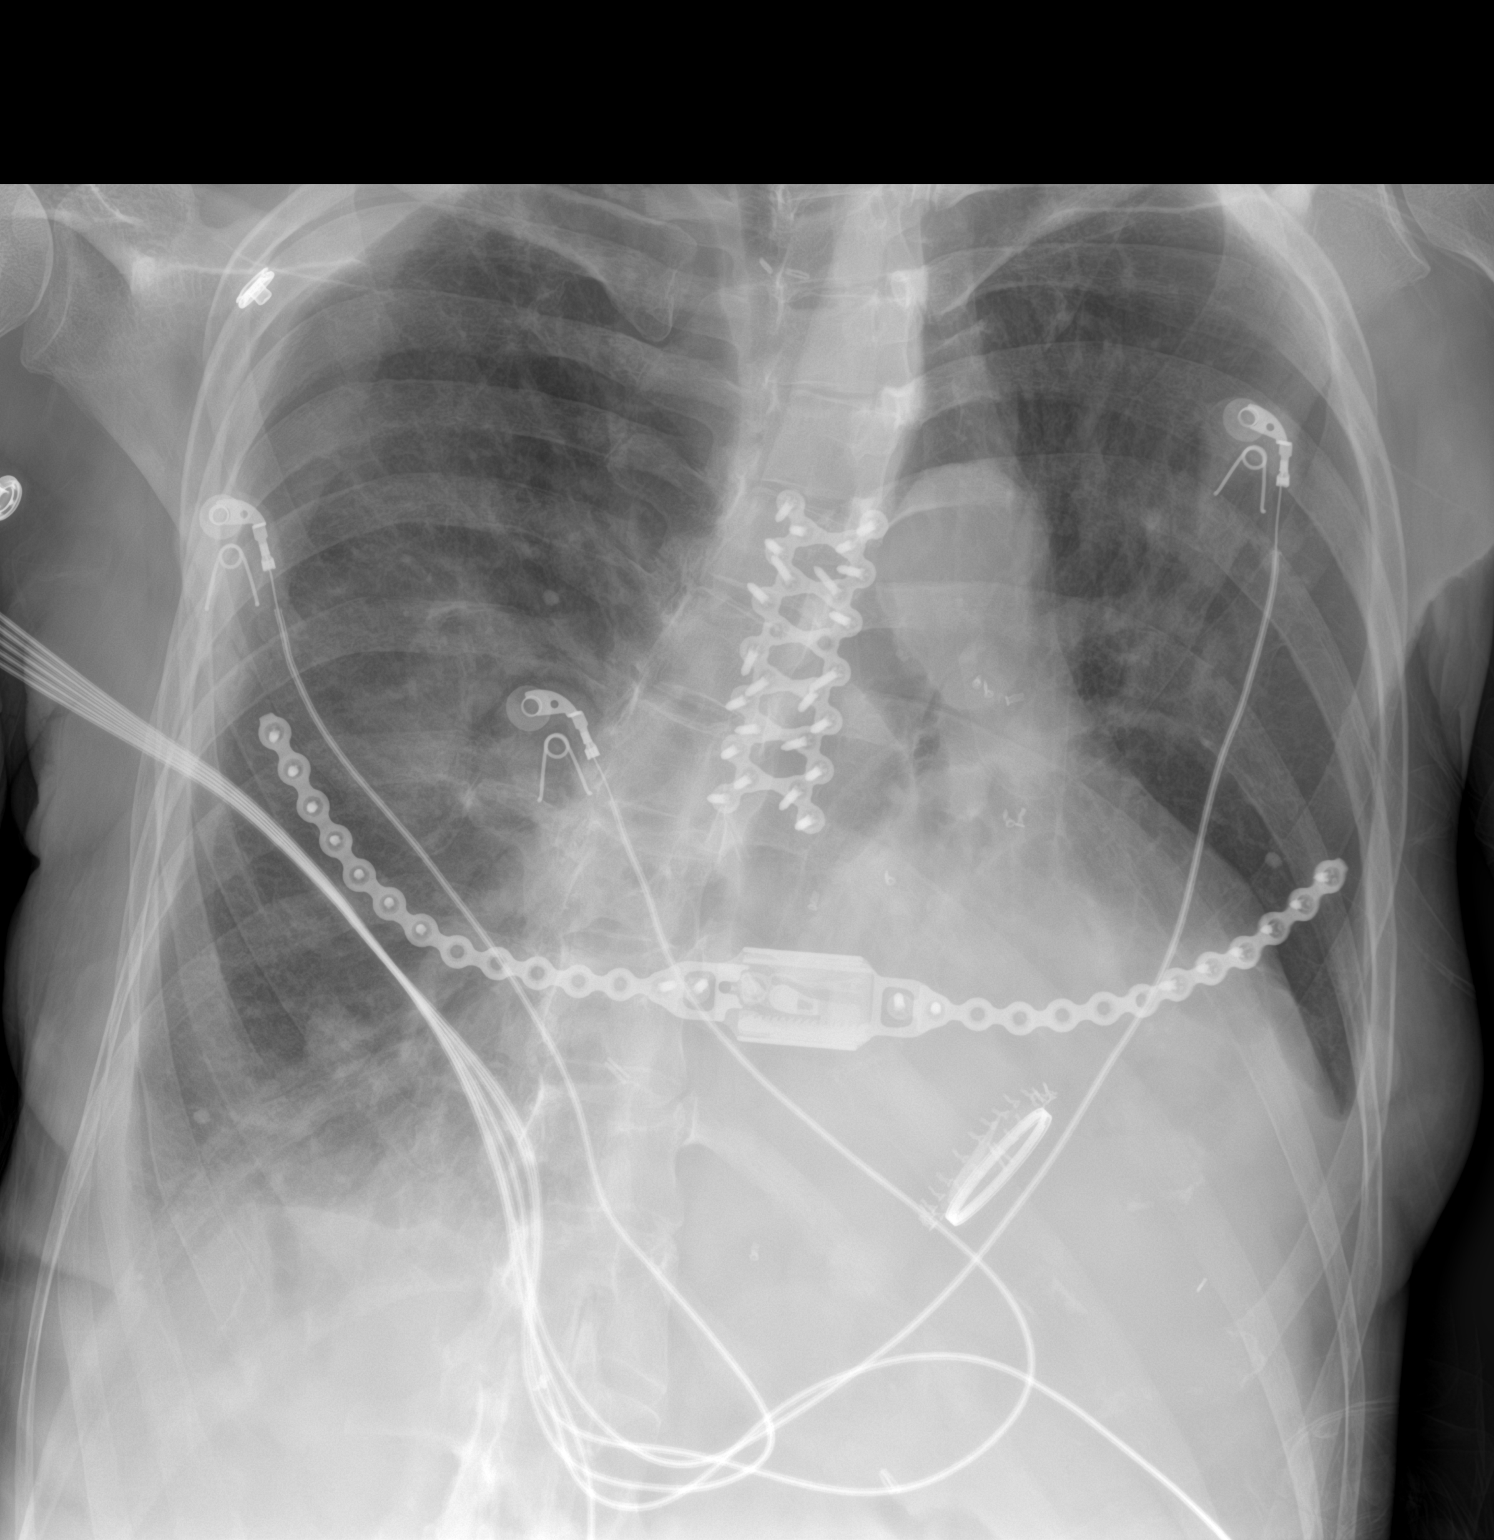

[2 of 2 positions shown; findings below may reference images not displayed]

FINDINGS: Lungs are hyperexpanded. Leftward patient rotation noted. Right
chest tube has been removed in the interval. No evidence for
right-sided pneumothorax. Right pleural thickening/fluid is similar
to prior with stable atelectasis at the right base. Left base
retrocardiac atelectasis or infiltrate in tiny left pleural effusion
again noted. Cardiopericardial silhouette is at upper limits of
normal for size.
IMPRESSION: 1. Interval removal of right chest tube without evidence for
right-sided pneumothorax.
2. Otherwise no substantial change.

## 2022-08-31 DIAGNOSIS — I519 Heart disease, unspecified: Secondary | ICD-10-CM | POA: Diagnosis not present

## 2022-08-31 DIAGNOSIS — Z952 Presence of prosthetic heart valve: Secondary | ICD-10-CM | POA: Diagnosis not present

## 2022-08-31 DIAGNOSIS — I071 Rheumatic tricuspid insufficiency: Secondary | ICD-10-CM | POA: Diagnosis not present

## 2022-09-01 DIAGNOSIS — Q8740 Marfan's syndrome, unspecified: Secondary | ICD-10-CM | POA: Diagnosis not present

## 2022-09-01 DIAGNOSIS — R791 Abnormal coagulation profile: Secondary | ICD-10-CM | POA: Diagnosis not present

## 2022-09-01 DIAGNOSIS — Z952 Presence of prosthetic heart valve: Secondary | ICD-10-CM | POA: Diagnosis not present

## 2022-09-01 DIAGNOSIS — Z7901 Long term (current) use of anticoagulants: Secondary | ICD-10-CM | POA: Diagnosis not present

## 2022-09-03 DIAGNOSIS — Z952 Presence of prosthetic heart valve: Secondary | ICD-10-CM | POA: Diagnosis not present

## 2022-09-03 DIAGNOSIS — I519 Heart disease, unspecified: Secondary | ICD-10-CM | POA: Diagnosis not present

## 2022-09-07 DIAGNOSIS — I493 Ventricular premature depolarization: Secondary | ICD-10-CM | POA: Diagnosis not present

## 2022-09-10 DIAGNOSIS — I4892 Unspecified atrial flutter: Secondary | ICD-10-CM | POA: Diagnosis not present

## 2022-09-10 DIAGNOSIS — Z952 Presence of prosthetic heart valve: Secondary | ICD-10-CM | POA: Diagnosis not present

## 2022-09-10 DIAGNOSIS — I77810 Thoracic aortic ectasia: Secondary | ICD-10-CM | POA: Diagnosis not present

## 2022-09-10 DIAGNOSIS — I480 Paroxysmal atrial fibrillation: Secondary | ICD-10-CM | POA: Diagnosis not present

## 2022-09-10 DIAGNOSIS — Z7901 Long term (current) use of anticoagulants: Secondary | ICD-10-CM | POA: Diagnosis not present

## 2022-09-10 DIAGNOSIS — I519 Heart disease, unspecified: Secondary | ICD-10-CM | POA: Diagnosis not present

## 2022-09-10 DIAGNOSIS — N926 Irregular menstruation, unspecified: Secondary | ICD-10-CM | POA: Diagnosis not present

## 2022-09-10 DIAGNOSIS — Z0000 Encounter for general adult medical examination without abnormal findings: Secondary | ICD-10-CM | POA: Diagnosis not present

## 2022-09-10 DIAGNOSIS — F4310 Post-traumatic stress disorder, unspecified: Secondary | ICD-10-CM | POA: Diagnosis not present

## 2022-09-10 DIAGNOSIS — D649 Anemia, unspecified: Secondary | ICD-10-CM | POA: Diagnosis not present

## 2022-09-10 DIAGNOSIS — Q8740 Marfan's syndrome, unspecified: Secondary | ICD-10-CM | POA: Diagnosis not present

## 2022-09-18 DIAGNOSIS — R195 Other fecal abnormalities: Secondary | ICD-10-CM | POA: Diagnosis not present

## 2022-09-22 ENCOUNTER — Other Ambulatory Visit (HOSPITAL_COMMUNITY): Payer: Self-pay

## 2022-09-22 DIAGNOSIS — Q8740 Marfan's syndrome, unspecified: Secondary | ICD-10-CM | POA: Diagnosis not present

## 2022-09-22 DIAGNOSIS — Z7901 Long term (current) use of anticoagulants: Secondary | ICD-10-CM | POA: Diagnosis not present

## 2022-09-22 DIAGNOSIS — Z952 Presence of prosthetic heart valve: Secondary | ICD-10-CM | POA: Diagnosis not present

## 2022-09-23 ENCOUNTER — Other Ambulatory Visit (HOSPITAL_COMMUNITY): Payer: Self-pay

## 2022-09-23 MED ORDER — WARFARIN SODIUM 5 MG PO TABS
ORAL_TABLET | ORAL | 3 refills | Status: AC
  Filled 2022-09-23: qty 120, 84d supply, fill #0
  Filled 2022-10-22: qty 180, 90d supply, fill #0

## 2022-09-24 ENCOUNTER — Other Ambulatory Visit (HOSPITAL_COMMUNITY): Payer: Self-pay

## 2022-09-30 ENCOUNTER — Other Ambulatory Visit (HOSPITAL_COMMUNITY): Payer: Self-pay

## 2022-10-07 ENCOUNTER — Other Ambulatory Visit (HOSPITAL_COMMUNITY): Payer: Self-pay

## 2022-10-07 DIAGNOSIS — Q8740 Marfan's syndrome, unspecified: Secondary | ICD-10-CM | POA: Diagnosis not present

## 2022-10-07 DIAGNOSIS — Z952 Presence of prosthetic heart valve: Secondary | ICD-10-CM | POA: Diagnosis not present

## 2022-10-07 DIAGNOSIS — I77810 Thoracic aortic ectasia: Secondary | ICD-10-CM | POA: Diagnosis not present

## 2022-10-09 ENCOUNTER — Other Ambulatory Visit (HOSPITAL_COMMUNITY): Payer: Self-pay

## 2022-10-09 ENCOUNTER — Other Ambulatory Visit: Payer: Self-pay

## 2022-10-09 MED ORDER — SERTRALINE HCL 50 MG PO TABS
75.0000 mg | ORAL_TABLET | Freq: Every day | ORAL | 0 refills | Status: DC
  Filled 2022-10-09: qty 45, 30d supply, fill #0

## 2022-10-10 ENCOUNTER — Other Ambulatory Visit (HOSPITAL_COMMUNITY): Payer: Self-pay

## 2022-10-14 DIAGNOSIS — Z7901 Long term (current) use of anticoagulants: Secondary | ICD-10-CM | POA: Diagnosis not present

## 2022-10-14 DIAGNOSIS — Z952 Presence of prosthetic heart valve: Secondary | ICD-10-CM | POA: Diagnosis not present

## 2022-10-14 DIAGNOSIS — Z5181 Encounter for therapeutic drug level monitoring: Secondary | ICD-10-CM | POA: Diagnosis not present

## 2022-10-15 DIAGNOSIS — I480 Paroxysmal atrial fibrillation: Secondary | ICD-10-CM | POA: Diagnosis not present

## 2022-10-15 DIAGNOSIS — G4733 Obstructive sleep apnea (adult) (pediatric): Secondary | ICD-10-CM | POA: Diagnosis not present

## 2022-10-16 DIAGNOSIS — F4311 Post-traumatic stress disorder, acute: Secondary | ICD-10-CM | POA: Diagnosis not present

## 2022-10-16 DIAGNOSIS — F411 Generalized anxiety disorder: Secondary | ICD-10-CM | POA: Diagnosis not present

## 2022-10-21 DIAGNOSIS — F411 Generalized anxiety disorder: Secondary | ICD-10-CM | POA: Diagnosis not present

## 2022-10-21 DIAGNOSIS — F4311 Post-traumatic stress disorder, acute: Secondary | ICD-10-CM | POA: Diagnosis not present

## 2022-10-22 ENCOUNTER — Other Ambulatory Visit: Payer: Self-pay

## 2022-10-22 ENCOUNTER — Other Ambulatory Visit (HOSPITAL_COMMUNITY): Payer: Self-pay

## 2022-10-23 ENCOUNTER — Other Ambulatory Visit (HOSPITAL_COMMUNITY): Payer: Self-pay

## 2022-10-23 DIAGNOSIS — G4733 Obstructive sleep apnea (adult) (pediatric): Secondary | ICD-10-CM | POA: Diagnosis not present

## 2022-10-23 MED ORDER — SPIRONOLACTONE 25 MG PO TABS
12.5000 mg | ORAL_TABLET | Freq: Every day | ORAL | 3 refills | Status: DC
  Filled 2022-10-23: qty 45, 90d supply, fill #0
  Filled 2023-01-22: qty 45, 90d supply, fill #1
  Filled 2023-04-25: qty 45, 90d supply, fill #2
  Filled 2023-07-02 – 2023-07-28 (×2): qty 45, 90d supply, fill #3

## 2022-10-27 ENCOUNTER — Other Ambulatory Visit (HOSPITAL_COMMUNITY): Payer: Self-pay

## 2022-10-27 ENCOUNTER — Other Ambulatory Visit: Payer: Self-pay

## 2022-10-27 DIAGNOSIS — F900 Attention-deficit hyperactivity disorder, predominantly inattentive type: Secondary | ICD-10-CM | POA: Diagnosis not present

## 2022-10-27 DIAGNOSIS — F411 Generalized anxiety disorder: Secondary | ICD-10-CM | POA: Diagnosis not present

## 2022-10-27 MED ORDER — SERTRALINE HCL 50 MG PO TABS
75.0000 mg | ORAL_TABLET | Freq: Every day | ORAL | 0 refills | Status: DC
  Filled 2022-10-27 – 2022-11-09 (×2): qty 45, 30d supply, fill #0

## 2022-10-27 MED ORDER — ATOMOXETINE HCL 40 MG PO CAPS
40.0000 mg | ORAL_CAPSULE | Freq: Every day | ORAL | 0 refills | Status: DC
  Filled 2022-10-27: qty 30, 30d supply, fill #0

## 2022-10-29 DIAGNOSIS — D500 Iron deficiency anemia secondary to blood loss (chronic): Secondary | ICD-10-CM | POA: Diagnosis not present

## 2022-10-29 DIAGNOSIS — R195 Other fecal abnormalities: Secondary | ICD-10-CM | POA: Diagnosis not present

## 2022-10-30 ENCOUNTER — Other Ambulatory Visit (HOSPITAL_COMMUNITY): Payer: Self-pay

## 2022-11-09 ENCOUNTER — Other Ambulatory Visit (HOSPITAL_COMMUNITY): Payer: Self-pay

## 2022-11-23 ENCOUNTER — Other Ambulatory Visit (HOSPITAL_COMMUNITY): Payer: Self-pay

## 2022-11-23 DIAGNOSIS — Z7901 Long term (current) use of anticoagulants: Secondary | ICD-10-CM | POA: Diagnosis not present

## 2022-11-23 DIAGNOSIS — Z5181 Encounter for therapeutic drug level monitoring: Secondary | ICD-10-CM | POA: Diagnosis not present

## 2022-11-23 DIAGNOSIS — Z952 Presence of prosthetic heart valve: Secondary | ICD-10-CM | POA: Diagnosis not present

## 2022-11-23 MED ORDER — ENOXAPARIN SODIUM 100 MG/ML IJ SOSY
90.0000 mg | PREFILLED_SYRINGE | Freq: Two times a day (BID) | INTRAMUSCULAR | 0 refills | Status: AC
  Filled 2022-11-23: qty 18, 10d supply, fill #0

## 2022-11-24 ENCOUNTER — Other Ambulatory Visit: Payer: Self-pay

## 2022-11-25 ENCOUNTER — Other Ambulatory Visit (HOSPITAL_COMMUNITY): Payer: Self-pay

## 2022-11-25 MED ORDER — AMOXICILLIN-POT CLAVULANATE 875-125 MG PO TABS
ORAL_TABLET | ORAL | 0 refills | Status: AC
  Filled 2022-11-25: qty 8, 4d supply, fill #0

## 2022-11-26 ENCOUNTER — Other Ambulatory Visit (HOSPITAL_COMMUNITY): Payer: Self-pay

## 2022-11-26 ENCOUNTER — Other Ambulatory Visit: Payer: Self-pay

## 2022-11-26 DIAGNOSIS — F4311 Post-traumatic stress disorder, acute: Secondary | ICD-10-CM | POA: Diagnosis not present

## 2022-11-26 DIAGNOSIS — F900 Attention-deficit hyperactivity disorder, predominantly inattentive type: Secondary | ICD-10-CM | POA: Diagnosis not present

## 2022-11-26 DIAGNOSIS — F411 Generalized anxiety disorder: Secondary | ICD-10-CM | POA: Diagnosis not present

## 2022-11-26 MED ORDER — ATOMOXETINE HCL 40 MG PO CAPS
40.0000 mg | ORAL_CAPSULE | Freq: Two times a day (BID) | ORAL | 0 refills | Status: DC
  Filled 2022-11-26: qty 60, 30d supply, fill #0

## 2022-11-26 MED ORDER — SERTRALINE HCL 50 MG PO TABS
75.0000 mg | ORAL_TABLET | Freq: Every day | ORAL | 0 refills | Status: DC
  Filled 2022-11-26 – 2022-12-11 (×3): qty 135, 90d supply, fill #0

## 2022-12-03 DIAGNOSIS — Z5181 Encounter for therapeutic drug level monitoring: Secondary | ICD-10-CM | POA: Diagnosis not present

## 2022-12-03 DIAGNOSIS — Z7901 Long term (current) use of anticoagulants: Secondary | ICD-10-CM | POA: Diagnosis not present

## 2022-12-04 DIAGNOSIS — K3189 Other diseases of stomach and duodenum: Secondary | ICD-10-CM | POA: Diagnosis not present

## 2022-12-04 DIAGNOSIS — R161 Splenomegaly, not elsewhere classified: Secondary | ICD-10-CM | POA: Diagnosis not present

## 2022-12-04 DIAGNOSIS — Z79899 Other long term (current) drug therapy: Secondary | ICD-10-CM | POA: Diagnosis not present

## 2022-12-04 DIAGNOSIS — K648 Other hemorrhoids: Secondary | ICD-10-CM | POA: Diagnosis not present

## 2022-12-04 DIAGNOSIS — Z7982 Long term (current) use of aspirin: Secondary | ICD-10-CM | POA: Diagnosis not present

## 2022-12-04 DIAGNOSIS — K5730 Diverticulosis of large intestine without perforation or abscess without bleeding: Secondary | ICD-10-CM | POA: Diagnosis not present

## 2022-12-04 DIAGNOSIS — I4892 Unspecified atrial flutter: Secondary | ICD-10-CM | POA: Diagnosis not present

## 2022-12-04 DIAGNOSIS — R195 Other fecal abnormalities: Secondary | ICD-10-CM | POA: Diagnosis not present

## 2022-12-04 DIAGNOSIS — I77819 Aortic ectasia, unspecified site: Secondary | ICD-10-CM | POA: Diagnosis not present

## 2022-12-04 DIAGNOSIS — D500 Iron deficiency anemia secondary to blood loss (chronic): Secondary | ICD-10-CM | POA: Diagnosis not present

## 2022-12-10 DIAGNOSIS — Z952 Presence of prosthetic heart valve: Secondary | ICD-10-CM | POA: Diagnosis not present

## 2022-12-10 DIAGNOSIS — R791 Abnormal coagulation profile: Secondary | ICD-10-CM | POA: Diagnosis not present

## 2022-12-10 DIAGNOSIS — Z7901 Long term (current) use of anticoagulants: Secondary | ICD-10-CM | POA: Diagnosis not present

## 2022-12-10 DIAGNOSIS — G4733 Obstructive sleep apnea (adult) (pediatric): Secondary | ICD-10-CM | POA: Diagnosis not present

## 2022-12-10 DIAGNOSIS — Z5181 Encounter for therapeutic drug level monitoring: Secondary | ICD-10-CM | POA: Diagnosis not present

## 2022-12-11 ENCOUNTER — Other Ambulatory Visit (HOSPITAL_COMMUNITY): Payer: Self-pay

## 2022-12-17 DIAGNOSIS — Z5181 Encounter for therapeutic drug level monitoring: Secondary | ICD-10-CM | POA: Diagnosis not present

## 2022-12-17 DIAGNOSIS — Z7901 Long term (current) use of anticoagulants: Secondary | ICD-10-CM | POA: Diagnosis not present

## 2022-12-17 DIAGNOSIS — Z952 Presence of prosthetic heart valve: Secondary | ICD-10-CM | POA: Diagnosis not present

## 2022-12-21 ENCOUNTER — Other Ambulatory Visit (HOSPITAL_COMMUNITY): Payer: Self-pay

## 2022-12-24 ENCOUNTER — Other Ambulatory Visit (HOSPITAL_COMMUNITY): Payer: Self-pay

## 2022-12-24 DIAGNOSIS — F411 Generalized anxiety disorder: Secondary | ICD-10-CM | POA: Diagnosis not present

## 2022-12-24 DIAGNOSIS — F900 Attention-deficit hyperactivity disorder, predominantly inattentive type: Secondary | ICD-10-CM | POA: Diagnosis not present

## 2022-12-24 DIAGNOSIS — F4311 Post-traumatic stress disorder, acute: Secondary | ICD-10-CM | POA: Diagnosis not present

## 2022-12-24 MED ORDER — SERTRALINE HCL 50 MG PO TABS
75.0000 mg | ORAL_TABLET | Freq: Every day | ORAL | 0 refills | Status: DC
  Filled 2023-03-17: qty 135, 90d supply, fill #0

## 2022-12-24 MED ORDER — ATOMOXETINE HCL 40 MG PO CAPS
40.0000 mg | ORAL_CAPSULE | Freq: Two times a day (BID) | ORAL | 0 refills | Status: AC
  Filled 2022-12-24: qty 180, 90d supply, fill #0

## 2022-12-25 ENCOUNTER — Other Ambulatory Visit: Payer: Self-pay

## 2022-12-30 DIAGNOSIS — Z5181 Encounter for therapeutic drug level monitoring: Secondary | ICD-10-CM | POA: Diagnosis not present

## 2022-12-30 DIAGNOSIS — Z7901 Long term (current) use of anticoagulants: Secondary | ICD-10-CM | POA: Diagnosis not present

## 2022-12-30 DIAGNOSIS — Z952 Presence of prosthetic heart valve: Secondary | ICD-10-CM | POA: Diagnosis not present

## 2023-01-11 ENCOUNTER — Other Ambulatory Visit: Payer: Self-pay | Admitting: Oncology

## 2023-01-11 DIAGNOSIS — Z006 Encounter for examination for normal comparison and control in clinical research program: Secondary | ICD-10-CM

## 2023-01-19 ENCOUNTER — Other Ambulatory Visit (HOSPITAL_COMMUNITY): Payer: Self-pay

## 2023-01-19 ENCOUNTER — Other Ambulatory Visit: Payer: Self-pay

## 2023-01-19 MED ORDER — WARFARIN SODIUM 5 MG PO TABS
ORAL_TABLET | ORAL | 1 refills | Status: AC
  Filled 2023-01-19: qty 135, 90d supply, fill #0

## 2023-01-22 ENCOUNTER — Other Ambulatory Visit: Payer: Self-pay

## 2023-02-08 ENCOUNTER — Other Ambulatory Visit: Payer: Self-pay

## 2023-02-23 ENCOUNTER — Other Ambulatory Visit: Payer: Self-pay

## 2023-03-01 ENCOUNTER — Other Ambulatory Visit (HOSPITAL_COMMUNITY): Payer: Self-pay

## 2023-03-01 MED ORDER — WARFARIN SODIUM 5 MG PO TABS
5.0000 mg | ORAL_TABLET | Freq: Every day | ORAL | 1 refills | Status: DC
  Filled 2023-03-01 – 2023-05-28 (×2): qty 135, 90d supply, fill #0
  Filled 2023-08-29: qty 135, 90d supply, fill #1

## 2023-03-17 ENCOUNTER — Other Ambulatory Visit: Payer: Self-pay

## 2023-03-17 ENCOUNTER — Other Ambulatory Visit (HOSPITAL_COMMUNITY): Payer: Self-pay

## 2023-04-25 ENCOUNTER — Other Ambulatory Visit (HOSPITAL_COMMUNITY): Payer: Self-pay

## 2023-04-26 ENCOUNTER — Other Ambulatory Visit: Payer: Self-pay

## 2023-04-28 ENCOUNTER — Other Ambulatory Visit: Payer: Self-pay

## 2023-04-28 ENCOUNTER — Other Ambulatory Visit (HOSPITAL_COMMUNITY): Payer: Self-pay

## 2023-04-28 MED ORDER — ENTRESTO 24-26 MG PO TABS
1.0000 | ORAL_TABLET | Freq: Two times a day (BID) | ORAL | 11 refills | Status: DC
  Filled 2023-04-28: qty 60, 30d supply, fill #0
  Filled 2023-05-28: qty 60, 30d supply, fill #1
  Filled 2023-06-29: qty 60, 30d supply, fill #2
  Filled 2023-07-28: qty 60, 30d supply, fill #3
  Filled 2023-08-29: qty 60, 30d supply, fill #4
  Filled 2023-09-24: qty 60, 30d supply, fill #5
  Filled 2023-10-31: qty 60, 30d supply, fill #6
  Filled 2023-12-02: qty 60, 30d supply, fill #7
  Filled 2023-12-30: qty 60, 30d supply, fill #8
  Filled 2024-01-27: qty 60, 30d supply, fill #9
  Filled 2024-03-04: qty 60, 30d supply, fill #10
  Filled 2024-04-04: qty 60, 30d supply, fill #11

## 2023-05-07 ENCOUNTER — Other Ambulatory Visit (HOSPITAL_COMMUNITY): Payer: Self-pay

## 2023-05-13 ENCOUNTER — Other Ambulatory Visit (HOSPITAL_COMMUNITY): Payer: Self-pay

## 2023-05-13 MED ORDER — SERTRALINE HCL 50 MG PO TABS
75.0000 mg | ORAL_TABLET | Freq: Every day | ORAL | 0 refills | Status: DC
  Filled 2023-05-13 – 2023-06-17 (×2): qty 135, 90d supply, fill #0

## 2023-05-18 ENCOUNTER — Other Ambulatory Visit (HOSPITAL_COMMUNITY)
Admission: RE | Admit: 2023-05-18 | Discharge: 2023-05-18 | Disposition: A | Discharge status: Home / Self Care | Payer: Managed Care, Other (non HMO) | Source: Ambulatory Visit | Attending: Oncology | Admitting: Oncology

## 2023-05-18 DIAGNOSIS — Z006 Encounter for examination for normal comparison and control in clinical research program: Secondary | ICD-10-CM | POA: Insufficient documentation

## 2023-05-28 ENCOUNTER — Other Ambulatory Visit: Payer: Self-pay

## 2023-05-31 LAB — HELIX MOLECULAR SCREEN: Genetic Analysis Overall Interpretation: NEGATIVE

## 2023-06-17 ENCOUNTER — Other Ambulatory Visit: Payer: Self-pay

## 2023-06-17 ENCOUNTER — Other Ambulatory Visit (HOSPITAL_COMMUNITY): Payer: Self-pay

## 2023-06-22 ENCOUNTER — Other Ambulatory Visit (HOSPITAL_COMMUNITY): Payer: Self-pay

## 2023-06-29 ENCOUNTER — Other Ambulatory Visit (HOSPITAL_COMMUNITY): Payer: Self-pay

## 2023-07-02 ENCOUNTER — Other Ambulatory Visit (HOSPITAL_COMMUNITY): Payer: Self-pay

## 2023-07-05 ENCOUNTER — Other Ambulatory Visit (HOSPITAL_COMMUNITY): Payer: Self-pay

## 2023-07-05 MED ORDER — SCOPOLAMINE 1 MG/3DAYS TD PT72
1.0000 | MEDICATED_PATCH | TRANSDERMAL | 0 refills | Status: DC
  Filled 2023-07-05: qty 4, 12d supply, fill #0

## 2023-07-06 ENCOUNTER — Other Ambulatory Visit (HOSPITAL_COMMUNITY): Payer: Self-pay

## 2023-07-29 ENCOUNTER — Other Ambulatory Visit (HOSPITAL_COMMUNITY): Payer: Self-pay

## 2023-08-08 ENCOUNTER — Other Ambulatory Visit (HOSPITAL_COMMUNITY): Payer: Self-pay

## 2023-08-09 ENCOUNTER — Other Ambulatory Visit (HOSPITAL_COMMUNITY): Payer: Self-pay

## 2023-08-09 ENCOUNTER — Other Ambulatory Visit: Payer: Self-pay

## 2023-08-09 MED ORDER — METOPROLOL TARTRATE 50 MG PO TABS
50.0000 mg | ORAL_TABLET | Freq: Two times a day (BID) | ORAL | 3 refills | Status: AC
  Filled 2023-08-09: qty 180, 90d supply, fill #0
  Filled 2023-11-02: qty 180, 90d supply, fill #1
  Filled 2024-01-27: qty 180, 90d supply, fill #2
  Filled 2024-05-05: qty 180, 90d supply, fill #3

## 2023-08-12 ENCOUNTER — Other Ambulatory Visit (HOSPITAL_COMMUNITY): Payer: Self-pay

## 2023-08-12 MED ORDER — SERTRALINE HCL 50 MG PO TABS
75.0000 mg | ORAL_TABLET | Freq: Every day | ORAL | 0 refills | Status: AC
  Filled 2023-08-12 – 2023-09-16 (×2): qty 135, 90d supply, fill #0

## 2023-08-25 ENCOUNTER — Other Ambulatory Visit (HOSPITAL_COMMUNITY): Payer: Self-pay

## 2023-08-26 ENCOUNTER — Other Ambulatory Visit (HOSPITAL_COMMUNITY): Payer: Self-pay

## 2023-08-26 ENCOUNTER — Other Ambulatory Visit: Payer: Self-pay

## 2023-08-26 MED ORDER — FARXIGA 10 MG PO TABS
10.0000 mg | ORAL_TABLET | Freq: Every day | ORAL | 3 refills | Status: DC
  Filled 2023-08-26: qty 90, 90d supply, fill #0
  Filled 2023-11-23: qty 90, 90d supply, fill #1
  Filled 2024-02-21: qty 90, 90d supply, fill #2
  Filled 2024-05-23: qty 90, 90d supply, fill #3

## 2023-08-29 ENCOUNTER — Other Ambulatory Visit (HOSPITAL_COMMUNITY): Payer: Self-pay

## 2023-08-31 ENCOUNTER — Other Ambulatory Visit (HOSPITAL_COMMUNITY): Payer: Self-pay

## 2023-08-31 MED ORDER — WARFARIN SODIUM 5 MG PO TABS
ORAL_TABLET | ORAL | 2 refills | Status: AC
  Filled 2023-08-31: qty 135, 90d supply, fill #0

## 2023-09-16 ENCOUNTER — Other Ambulatory Visit (HOSPITAL_COMMUNITY): Payer: Self-pay

## 2023-09-24 ENCOUNTER — Other Ambulatory Visit (HOSPITAL_COMMUNITY): Payer: Self-pay

## 2023-09-24 ENCOUNTER — Other Ambulatory Visit: Payer: Self-pay

## 2023-10-31 ENCOUNTER — Other Ambulatory Visit (HOSPITAL_COMMUNITY): Payer: Self-pay

## 2023-11-01 ENCOUNTER — Other Ambulatory Visit (HOSPITAL_COMMUNITY): Payer: Self-pay

## 2023-11-01 ENCOUNTER — Other Ambulatory Visit: Payer: Self-pay

## 2023-11-01 MED ORDER — SPIRONOLACTONE 25 MG PO TABS
12.5000 mg | ORAL_TABLET | Freq: Every day | ORAL | 3 refills | Status: AC
  Filled 2023-11-01: qty 45, 90d supply, fill #0
  Filled 2024-01-27: qty 45, 90d supply, fill #1
  Filled 2024-05-05: qty 45, 90d supply, fill #2
  Filled 2024-08-04: qty 45, 90d supply, fill #3

## 2023-11-02 ENCOUNTER — Other Ambulatory Visit (HOSPITAL_COMMUNITY): Payer: Self-pay

## 2023-11-23 ENCOUNTER — Other Ambulatory Visit (HOSPITAL_COMMUNITY): Payer: Self-pay

## 2023-12-02 ENCOUNTER — Other Ambulatory Visit (HOSPITAL_COMMUNITY): Payer: Self-pay

## 2023-12-30 ENCOUNTER — Other Ambulatory Visit: Payer: Self-pay

## 2023-12-30 ENCOUNTER — Other Ambulatory Visit (HOSPITAL_COMMUNITY): Payer: Self-pay

## 2023-12-30 MED ORDER — WARFARIN SODIUM 5 MG PO TABS
5.0000 mg | ORAL_TABLET | Freq: Every day | ORAL | 2 refills | Status: AC
  Filled 2023-12-30: qty 140, 90d supply, fill #0
  Filled 2024-05-05: qty 140, 90d supply, fill #1
  Filled 2024-08-22: qty 140, 90d supply, fill #2

## 2024-01-28 ENCOUNTER — Other Ambulatory Visit (HOSPITAL_COMMUNITY): Payer: Self-pay

## 2024-02-22 ENCOUNTER — Other Ambulatory Visit (HOSPITAL_COMMUNITY): Payer: Self-pay

## 2024-03-06 ENCOUNTER — Other Ambulatory Visit (HOSPITAL_COMMUNITY): Payer: Self-pay

## 2024-04-04 ENCOUNTER — Other Ambulatory Visit (HOSPITAL_COMMUNITY): Payer: Self-pay

## 2024-05-05 ENCOUNTER — Other Ambulatory Visit (HOSPITAL_COMMUNITY): Payer: Self-pay

## 2024-05-05 ENCOUNTER — Other Ambulatory Visit: Payer: Self-pay

## 2024-05-05 MED ORDER — SACUBITRIL-VALSARTAN 24-26 MG PO TABS
1.0000 | ORAL_TABLET | Freq: Two times a day (BID) | ORAL | 11 refills | Status: AC
  Filled 2024-05-05: qty 60, 30d supply, fill #0
  Filled 2024-06-05: qty 60, 30d supply, fill #1
  Filled 2024-07-03: qty 60, 30d supply, fill #2
  Filled 2024-08-04: qty 60, 30d supply, fill #3
  Filled 2024-09-07: qty 180, 90d supply, fill #4

## 2024-05-23 ENCOUNTER — Other Ambulatory Visit: Payer: Self-pay

## 2024-06-05 ENCOUNTER — Other Ambulatory Visit: Payer: Self-pay

## 2024-06-05 ENCOUNTER — Other Ambulatory Visit (HOSPITAL_COMMUNITY): Payer: Self-pay

## 2024-06-06 ENCOUNTER — Other Ambulatory Visit: Payer: Self-pay

## 2024-07-03 ENCOUNTER — Other Ambulatory Visit (HOSPITAL_COMMUNITY): Payer: Self-pay

## 2024-08-04 ENCOUNTER — Other Ambulatory Visit: Payer: Self-pay

## 2024-08-04 ENCOUNTER — Other Ambulatory Visit (HOSPITAL_COMMUNITY): Payer: Self-pay

## 2024-08-07 ENCOUNTER — Other Ambulatory Visit: Payer: Self-pay

## 2024-08-07 ENCOUNTER — Other Ambulatory Visit (HOSPITAL_BASED_OUTPATIENT_CLINIC_OR_DEPARTMENT_OTHER): Payer: Self-pay

## 2024-08-07 MED ORDER — METOPROLOL TARTRATE 50 MG PO TABS
50.0000 mg | ORAL_TABLET | Freq: Two times a day (BID) | ORAL | 3 refills | Status: AC
  Filled 2024-08-07: qty 180, 90d supply, fill #0

## 2024-08-22 ENCOUNTER — Other Ambulatory Visit (HOSPITAL_COMMUNITY): Payer: Self-pay

## 2024-08-22 ENCOUNTER — Other Ambulatory Visit: Payer: Self-pay

## 2024-08-22 MED ORDER — FARXIGA 10 MG PO TABS
10.0000 mg | ORAL_TABLET | Freq: Every day | ORAL | 3 refills | Status: AC
  Filled 2024-08-22: qty 90, 90d supply, fill #0

## 2024-08-23 ENCOUNTER — Other Ambulatory Visit: Payer: Self-pay

## 2024-09-07 ENCOUNTER — Other Ambulatory Visit: Payer: Self-pay
